# Patient Record
Sex: Female | Born: 1970
Health system: Southern US, Community
[De-identification: ages and names within clinical notes are randomized; demographics above are authoritative.]

## PROBLEM LIST (undated history)

## (undated) DIAGNOSIS — Z87442 Personal history of urinary calculi: Secondary | ICD-10-CM

## (undated) DIAGNOSIS — Z923 Personal history of irradiation: Secondary | ICD-10-CM

## (undated) DIAGNOSIS — C50919 Malignant neoplasm of unspecified site of unspecified female breast: Secondary | ICD-10-CM

## (undated) DIAGNOSIS — R0989 Other specified symptoms and signs involving the circulatory and respiratory systems: Secondary | ICD-10-CM

## (undated) DIAGNOSIS — K219 Gastro-esophageal reflux disease without esophagitis: Secondary | ICD-10-CM

## (undated) DIAGNOSIS — R0683 Snoring: Secondary | ICD-10-CM

## (undated) DIAGNOSIS — Z9221 Personal history of antineoplastic chemotherapy: Secondary | ICD-10-CM

## (undated) DIAGNOSIS — Z973 Presence of spectacles and contact lenses: Secondary | ICD-10-CM

## (undated) HISTORY — PX: WISDOM TOOTH EXTRACTION: SHX21

## (undated) HISTORY — DX: Malignant neoplasm of unspecified site of unspecified female breast: C50.919

## (undated) HISTORY — PX: BREAST BIOPSY: SHX20

## (undated) HISTORY — PX: BREAST LUMPECTOMY: SHX2

---

## 1998-06-20 ENCOUNTER — Other Ambulatory Visit: Admission: RE | Admit: 1998-06-20 | Discharge: 1998-06-20 | Payer: Self-pay | Admitting: Obstetrics and Gynecology

## 1999-03-15 ENCOUNTER — Emergency Department (HOSPITAL_COMMUNITY): Admission: EM | Admit: 1999-03-15 | Discharge: 1999-03-15 | Payer: Self-pay | Admitting: Emergency Medicine

## 2001-09-09 ENCOUNTER — Other Ambulatory Visit: Admission: RE | Admit: 2001-09-09 | Discharge: 2001-09-09 | Payer: Self-pay | Admitting: Obstetrics and Gynecology

## 2003-02-12 ENCOUNTER — Other Ambulatory Visit: Admission: RE | Admit: 2003-02-12 | Discharge: 2003-02-12 | Payer: Self-pay | Admitting: Obstetrics and Gynecology

## 2003-10-24 ENCOUNTER — Other Ambulatory Visit: Admission: RE | Admit: 2003-10-24 | Discharge: 2003-10-24 | Payer: Self-pay | Admitting: Obstetrics and Gynecology

## 2004-02-18 ENCOUNTER — Other Ambulatory Visit: Admission: RE | Admit: 2004-02-18 | Discharge: 2004-02-18 | Payer: Self-pay | Admitting: Obstetrics and Gynecology

## 2013-07-24 ENCOUNTER — Other Ambulatory Visit: Payer: Self-pay | Admitting: Obstetrics and Gynecology

## 2013-07-24 DIAGNOSIS — N644 Mastodynia: Secondary | ICD-10-CM

## 2013-07-24 DIAGNOSIS — N6459 Other signs and symptoms in breast: Secondary | ICD-10-CM

## 2013-07-28 ENCOUNTER — Other Ambulatory Visit: Payer: Self-pay | Admitting: Obstetrics and Gynecology

## 2013-07-28 ENCOUNTER — Ambulatory Visit
Admission: RE | Admit: 2013-07-28 | Discharge: 2013-07-28 | Disposition: A | Discharge status: Home / Self Care | Payer: 59 | Source: Ambulatory Visit | Attending: Obstetrics and Gynecology | Admitting: Obstetrics and Gynecology

## 2013-07-28 DIAGNOSIS — N644 Mastodynia: Secondary | ICD-10-CM

## 2013-07-28 DIAGNOSIS — N6459 Other signs and symptoms in breast: Secondary | ICD-10-CM

## 2013-08-07 ENCOUNTER — Other Ambulatory Visit: Payer: Self-pay | Admitting: Obstetrics and Gynecology

## 2013-08-07 DIAGNOSIS — N6459 Other signs and symptoms in breast: Secondary | ICD-10-CM

## 2013-08-07 DIAGNOSIS — N644 Mastodynia: Secondary | ICD-10-CM

## 2013-08-08 ENCOUNTER — Ambulatory Visit
Admission: RE | Admit: 2013-08-08 | Discharge: 2013-08-08 | Disposition: A | Discharge status: Home / Self Care | Payer: 59 | Source: Ambulatory Visit | Attending: Obstetrics and Gynecology | Admitting: Obstetrics and Gynecology

## 2013-08-08 DIAGNOSIS — N6459 Other signs and symptoms in breast: Secondary | ICD-10-CM

## 2013-08-08 DIAGNOSIS — N644 Mastodynia: Secondary | ICD-10-CM

## 2013-08-09 ENCOUNTER — Other Ambulatory Visit: Payer: Self-pay | Admitting: Obstetrics and Gynecology

## 2013-08-09 DIAGNOSIS — C50911 Malignant neoplasm of unspecified site of right female breast: Secondary | ICD-10-CM

## 2013-08-09 DIAGNOSIS — D0511 Intraductal carcinoma in situ of right breast: Secondary | ICD-10-CM

## 2013-08-11 ENCOUNTER — Telehealth: Payer: Self-pay | Admitting: *Deleted

## 2013-08-11 ENCOUNTER — Other Ambulatory Visit: Payer: Self-pay | Admitting: *Deleted

## 2013-08-11 DIAGNOSIS — Z170 Estrogen receptor positive status [ER+]: Secondary | ICD-10-CM | POA: Insufficient documentation

## 2013-08-11 DIAGNOSIS — C50411 Malignant neoplasm of upper-outer quadrant of right female breast: Secondary | ICD-10-CM | POA: Insufficient documentation

## 2013-08-11 NOTE — Telephone Encounter (Signed)
Confirmed [facility] for 08/16/13 at 0800.  Instructions and contact information given.

## 2013-08-15 ENCOUNTER — Ambulatory Visit
Admission: RE | Admit: 2013-08-15 | Discharge: 2013-08-15 | Disposition: A | Discharge status: Home / Self Care | Payer: 59 | Source: Ambulatory Visit | Attending: Obstetrics and Gynecology | Admitting: Obstetrics and Gynecology

## 2013-08-15 ENCOUNTER — Other Ambulatory Visit: Payer: 59

## 2013-08-15 DIAGNOSIS — D0511 Intraductal carcinoma in situ of right breast: Secondary | ICD-10-CM

## 2013-08-15 DIAGNOSIS — C50911 Malignant neoplasm of unspecified site of right female breast: Secondary | ICD-10-CM

## 2013-08-15 MED ORDER — GADOBENATE DIMEGLUMINE 529 MG/ML IV SOLN
20.0000 mL | Freq: Once | INTRAVENOUS | Status: AC | PRN
  Administered 2013-08-15: 20 mL via INTRAVENOUS

## 2013-08-16 ENCOUNTER — Telehealth: Payer: Self-pay | Admitting: Oncology

## 2013-08-16 ENCOUNTER — Encounter: Payer: Self-pay | Admitting: Oncology

## 2013-08-16 ENCOUNTER — Encounter: Payer: Self-pay | Admitting: *Deleted

## 2013-08-16 ENCOUNTER — Other Ambulatory Visit (HOSPITAL_BASED_OUTPATIENT_CLINIC_OR_DEPARTMENT_OTHER): Payer: 59

## 2013-08-16 ENCOUNTER — Ambulatory Visit
Admission: RE | Admit: 2013-08-16 | Discharge: 2013-08-16 | Disposition: A | Discharge status: Home / Self Care | Payer: 59 | Source: Ambulatory Visit | Attending: Radiation Oncology | Admitting: Radiation Oncology

## 2013-08-16 ENCOUNTER — Ambulatory Visit: Payer: 59

## 2013-08-16 ENCOUNTER — Encounter: Payer: Self-pay | Admitting: Dietician

## 2013-08-16 ENCOUNTER — Ambulatory Visit (HOSPITAL_BASED_OUTPATIENT_CLINIC_OR_DEPARTMENT_OTHER): Payer: 59 | Admitting: Surgery

## 2013-08-16 ENCOUNTER — Encounter (INDEPENDENT_AMBULATORY_CARE_PROVIDER_SITE_OTHER): Payer: Self-pay

## 2013-08-16 ENCOUNTER — Encounter (INDEPENDENT_AMBULATORY_CARE_PROVIDER_SITE_OTHER): Payer: Self-pay | Admitting: Surgery

## 2013-08-16 ENCOUNTER — Ambulatory Visit (HOSPITAL_BASED_OUTPATIENT_CLINIC_OR_DEPARTMENT_OTHER): Payer: 59 | Admitting: Oncology

## 2013-08-16 VITALS — BP 117/67 | HR 81 | Temp 98.2°F | Resp 18 | Ht 65.0 in | Wt 313.1 lb

## 2013-08-16 DIAGNOSIS — C50919 Malignant neoplasm of unspecified site of unspecified female breast: Secondary | ICD-10-CM

## 2013-08-16 DIAGNOSIS — C50411 Malignant neoplasm of upper-outer quadrant of right female breast: Secondary | ICD-10-CM

## 2013-08-16 DIAGNOSIS — Z170 Estrogen receptor positive status [ER+]: Secondary | ICD-10-CM

## 2013-08-16 DIAGNOSIS — C50911 Malignant neoplasm of unspecified site of right female breast: Secondary | ICD-10-CM

## 2013-08-16 DIAGNOSIS — C50419 Malignant neoplasm of upper-outer quadrant of unspecified female breast: Secondary | ICD-10-CM

## 2013-08-16 LAB — COMPREHENSIVE METABOLIC PANEL (CC13)
ALBUMIN: 3.4 g/dL — AB (ref 3.5–5.0)
ALK PHOS: 76 U/L (ref 40–150)
ALT: 20 U/L (ref 0–55)
AST: 20 U/L (ref 5–34)
Anion Gap: 10 mEq/L (ref 3–11)
BUN: 7.7 mg/dL (ref 7.0–26.0)
CALCIUM: 9.1 mg/dL (ref 8.4–10.4)
CHLORIDE: 106 meq/L (ref 98–109)
CO2: 24 mEq/L (ref 22–29)
Creatinine: 0.8 mg/dL (ref 0.6–1.1)
GLUCOSE: 113 mg/dL (ref 70–140)
POTASSIUM: 3.8 meq/L (ref 3.5–5.1)
Sodium: 140 mEq/L (ref 136–145)
Total Bilirubin: 0.24 mg/dL (ref 0.20–1.20)
Total Protein: 7.1 g/dL (ref 6.4–8.3)

## 2013-08-16 LAB — CBC WITH DIFFERENTIAL/PLATELET
BASO%: 0.7 % (ref 0.0–2.0)
BASOS ABS: 0.1 10*3/uL (ref 0.0–0.1)
EOS%: 1 % (ref 0.0–7.0)
Eosinophils Absolute: 0.1 10*3/uL (ref 0.0–0.5)
HEMATOCRIT: 38.2 % (ref 34.8–46.6)
HGB: 12.1 g/dL (ref 11.6–15.9)
LYMPH%: 39.4 % (ref 14.0–49.7)
MCH: 26.6 pg (ref 25.1–34.0)
MCHC: 31.7 g/dL (ref 31.5–36.0)
MCV: 83.8 fL (ref 79.5–101.0)
MONO#: 0.6 10*3/uL (ref 0.1–0.9)
MONO%: 7.1 % (ref 0.0–14.0)
NEUT#: 4.2 10*3/uL (ref 1.5–6.5)
NEUT%: 51.8 % (ref 38.4–76.8)
PLATELETS: 341 10*3/uL (ref 145–400)
RBC: 4.56 10*6/uL (ref 3.70–5.45)
RDW: 16.1 % — AB (ref 11.2–14.5)
WBC: 8.2 10*3/uL (ref 3.9–10.3)
lymph#: 3.2 10*3/uL (ref 0.9–3.3)

## 2013-08-16 NOTE — Patient Instructions (Signed)
Implanted Port Insertion  An implanted port is a central line that has a round shape and is placed under the skin. It is used as a long-term IV access for:    Medicines, such as chemotherapy.    Fluids.    Liquid nutrition, such as total parenteral nutrition (TPN).    Blood samples.   LET YOUR HEALTH CARE PROVIDER KNOW ABOUT:   Allergies to food or medicine.    Medicines taken, including vitamins, herbs, eye drops, creams, and over-the-counter medicines.    Any allergies to heparin.   Use of steroids (by mouth or creams).    Previous problems with anesthetics or numbing medicines.    History of bleeding problems or blood clots.    Previous surgery.    Other health problems, including diabetes and kidney problems.    Possibility of pregnancy, if this applies.  RISKS AND COMPLICATIONS  Generally, this is a safe procedure. However, as with any procedure, problems can occur. Possible problems include:   Damage to the blood vessel, bruising, or bleeding at the puncture site.    Infection.   Blood clot in the vessel that the port is in.   Breakdown of the skin over your port.   Very rarely a person may develop a condition called a pneumothorax, a collection of air in the chest that may cause one of the lungs to collapse. The placement of these catheters with the appropriate imaging guidance significantly decreases the risk of a pneumothorax.   BEFORE THE PROCEDURE    Your health care provider may want you to have blood tests. These tests can help tell how well your kidneys and liver are working. They can also show how well your blood clots.    If you take blood thinners (anticoagulant medicines), ask your health care provider when you should stop taking them.    Make arrangements for someone to drive you home. This is necessary if you have been sedated for your procedure.   PROCEDURE   Port insertion usually takes about 30-45 minutes.    An IV needle will be inserted in your arm.  Medicine for pain and medicine to help relax you (sedative) will flow directly into your body through this needle.    You will lie on an exam table, and you will be connected to monitors to keep track of your heart rate, blood pressure, and breathing throughout the procedure.   An oxygen monitoring device may be attached to your finger. Oxygen will be given.    Everything will be kept as germ free (sterile) as possible during the procedure. The skin near the point of the incision will be cleansed with antiseptic, and the area will be draped with sterile towels. The skin and deeper tissues over the port area will be made numb with a local anesthetic.   Two small cuts (incisions) will be made in the skin to insert the port. One will be made in the neck to obtain access to the vein where the catheter will lie.    Because the port reservoir will be placed under the skin, a small skin incision will be made in the upper chest, and a small pocket for the port will be made under the skin. The catheter that will be connected to the port tunnels to a large central vein in the chest. A small, raised area will remain on your body at the site of the reservoir when the procedure is complete.   The port placement   will be done under imaging guidance to ensure the proper placement.   The reservoir has a silicone covering that can be punctured with a special needle.    The port will be flushed with normal saline, and blood will be drawn to make sure it is working properly.   There will be nothing remaining outside the skin when the procedure is finished.    Incisions will be held together by stitches, surgical glue, or a special tape.  AFTER THE PROCEDURE   You will stay in a recovery area until the anesthesia has worn off. Your blood pressure and pulse will be checked.   A final chest X-ray will be taken to check the placement of the port and to ensure that there is no injury to your lung.  Document Released:  10/19/2012 Document Revised: 05/15/2013 Document Reviewed: 10/19/2012  [employer] Patient Information 2015 [employer]. This information is not intended to replace advice given to you by your health care provider. Make sure you discuss any questions you have with your health care provider.

## 2013-08-16 NOTE — Progress Notes (Signed)
I did advise her of the [place]--

## 2013-08-16 NOTE — Progress Notes (Signed)
[facility] NEW PATIENT EVALUATION  Name: [name] MRN: [id number]  Date:   08/16/2013           DOB: 1970-05-08  Status: outpatient   CC: Linda Pel, MD  [name], Linda Faster., MD    REFERRING PHYSICIAN: Cornett, Linda Faster., MD   DIAGNOSIS:  [name] I (T1 N0 M0) invasive ductal/DCIS of the right breast  HISTORY OF PRESENT ILLNESS:  [name] is a 43 y.o. female who is seen today at the [facility] for evaluation of her T1 N0 invasive ductal/DCIS of the right breast. At the time of screening mammography on July 17 she was noted to have a right breast mass with calcifications at 10:30. Ultrasound showed a 1.1 x 0.9 cm mass along with 2-3 lymph nodes were thickened cortices. On July 28 she underwent biopsy of the right breast mass and an axillary lymph node. The lymph node was benign, and the right breast mass was diagnostic for invasive ductal/DCIS. The tumor was ER positive at 49%, PR +47% with an elevated Ki-67 of 52%. HER-2/neu is amplified. Breast MR on August 4 should a 2.0 cm mass within the upper-outer quadrant of the right breast with no other suspicious lesions. She seen today with Dr. Brantley [name] and Dr. Jana [name].  PREVIOUS RADIATION THERAPY: No   PAST MEDICAL HISTORY:  has a past medical history of Anxiety.     PAST SURGICAL HISTORY: No past surgical history on file.   FAMILY HISTORY: family history includes Colon cancer in her father; Leukemia in her maternal grandmother; Melanoma in her paternal grandmother; Prostate cancer in her cousin and other; Uterine cancer in her mother. No family history of breast cancer.   SOCIAL HISTORY:  reports that she quit smoking about 5 months ago. She does not have any smokeless tobacco history on file. She reports that she drinks alcohol. She reports that she does not use illicit drugs. Single, no children. 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   ALLERGIES: Amoxicillin   MEDICATIONS:  Current  Outpatient Prescriptions  Medication Sig Dispense Refill  . Calcium-Vitamin D-Vitamin K (CALCIUM SOFT CHEWS PO) Take by mouth daily.      . Multiple Vitamins-Minerals (MULTI ADULT GUMMIES PO) Take 2 each by mouth daily.       No current facility-administered medications for this encounter.     REVIEW OF SYSTEMS:  Pertinent items are noted in HPI.    PHYSICAL EXAM: Alert and oriented 43 year old African American female appearing her stated age. Head and neck examination: Grossly unremarkable. Nodes: Without palpable cervical, supraclavicular, or axillary lymphadenopathy. Chest: Lungs clear. Breasts: She is large breasted. There is a biopsy wound at approximately 12:00 along with a small area of ecchymosis. There is a  palpable superficial 2-3 cm mass along the upper-outer quadrant at approximately 10. Left breast without masses or lesions. Extremities: Without edema.    LABORATORY DATA:  Lab Results  Component Value Date   WBC 8.2 08/16/2013   HGB 12.1 08/16/2013   HCT 38.2 08/16/2013   MCV 83.8 08/16/2013   PLT 341 08/16/2013   Lab Results  Component Value Date   NA 140 08/16/2013   K 3.8 08/16/2013   CO2 24 08/16/2013   Lab Results  Component Value Date   ALT 20 08/16/2013   AST 20 08/16/2013   ALKPHOS 76 08/16/2013   BILITOT 0.24 08/16/2013      IMPRESSION: Clinical [name] I (T1, N0, M0) invasive ductal/DCIS of the  right breast. We discussed local treatment options which include mastectomy versus partial mastectomy followed by radiation therapy. Both options would include a sentinel lymph node biopsy. We discussed the value of genetic testing based on her age and strong family history of cancer. She'll benefit from neoadjuvant chemotherapy awaiting for results of genetic testing. If her genetic testing is negative then she is certainly candidate for breast preservation. We discussed the potential acute and late toxicities of radiation therapy. At this point in time she has decided on neoadjuvant  chemotherapy followed by breast conservative surgery and then radiation therapy. I would repeat her right breast mammogram prior to initiation of radiation therapy to confirm removal of all suspicious microcalcifications which probably represent DCIS.  PLAN: As discussed above.   I spent 30 minutes minutes face to face with the patient and more than 50% of that time was spent in counseling and/or coordination of care.

## 2013-08-16 NOTE — Progress Notes (Signed)
[place]  [phone] [phone]     ID: [name] DOB: [birth year]  MR#: [id number]  RKY#:[id number]  Patient Care Team: Horatio Pel, MD as PCP - General (Internal Medicine) Joyice Faster. Cornett, MD as Consulting Physician (General Surgery) Chauncey Cruel, MD as Consulting Physician (Oncology) Rexene Edison, MD as Consulting Physician (Radiation Oncology) Allena Katz, MD as Consulting Physician (Obstetrics and Gynecology)  CHIEF COMPLAINT: Newly diagnosed triple positive breast cancer  CURRENT TREATMENT: To start neoadjuvant chemotherapy   BREAST CANCER HISTORY: The patient herself palpated a mass in her right breast mid July. She brought it to Dr. Sherlynn Stalls attention and he set the patient up for bilateral diagnostic mammography and right breast ultrasonography of the breast Center 07/28/2013. Right mammogram showed an irregular mass in the upper outer quadrant measuring 1.4 cm. There was some associated pleomorphic microcalcifications. This was palpable, at the 10:30 position 11 cm from the nipple. By palpation it measured approximately 1.5 cm. Ultrasound confirmed an hypoechoic mass with irregular borders measuring 1.1 cm. The right axilla showed a few adjacent deep lymph nodes with mild cortical thickening.  On 08/08/2013 the patient underwent biopsy of the breast mass in question. The pathology ([id number]) showed an invasive ductal carcinoma, grade 2, estrogen receptor 49% positive, progesterone receptor 47% positive, both with moderate staining intensity, with an MIB-1 of 52%, and HER-2 amplification with a signals ratio of 8.1. The right axillary lymph node biopsied at the same time was benign this was felt to be possibly concordant.  And 08/15/2013 the patient underwent bilateral breast MRI showing a 2 cm irregular enhancing mass in the upper outer quadrant of the right breast. There were no additional changes no abnormal  appearing lymph nodes and no masses or abnormalities in the left breast.  The patient's subsequent history is as detailed below  INTERVAL HISTORY: [name] was evaluated in the multidisciplinary breast cancer clinic 08/16/2013 accompanied by her mother [name]. The patient's case was also presented at the multidisciplinary breast cancer conference that same morning  REVIEW OF SYSTEMS: Aside from the palpable mass itself, there were no specific symptoms leading to the definitive mammogram. The patient denies unusual headaches, visual changes, nausea, vomiting, stiff neck, dizziness, or gait imbalance. There has been no cough, phlegm production, or pleurisy, no chest pain or pressure, and no change in bowel or bladder habits. The patient denies fever, rash, bleeding, unexplained fatigue or unexplained weight loss. The patient does complain of insomnia, ankle swelling, some back and joint pain "here and there" not more intense or persistent than before. She feels forgetful and anxious. A detailed review of systems was otherwise entirely negative.  PAST MEDICAL HISTORY: Past Medical History  Diagnosis Date  . Anxiety     PAST SURGICAL HISTORY: History reviewed. No pertinent past surgical history.  FAMILY HISTORY Family History  Problem Relation Age of Onset  . Uterine cancer Mother   . Colon cancer Father   . Leukemia Maternal Grandmother   . Melanoma Paternal Grandmother   . Prostate cancer Cousin   . Prostate cancer Other    the patient's father died from colon cancer the age of 54. It had been diagnosed 3 years prior. The patient's mother was diagnosed with cancer of the uterus at age 28. She is 44 years old as of August 2015. The patient had no brothers or sisters. There is a history of prostate cancer leukemia at in the family as well as melanoma, but  there is no history of breast or ovarian cancer in the family.  GYNECOLOGIC HISTORY:  No LMP recorded. Menarche age 72. The patient is GX P0.  She is still having regular periods. 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 She lives alone, with no pets.    ADVANCED DIRECTIVES: Not in place. At her 08/16/2013 visit the patient was given the upper. Documents 2 complete and notarize so she may name her mother, [name] focal as her healthcare power of attorney, which is the patient's intention. Ms. focal can be reached at [phone]. She currently lives in [place] but is planning to move to [place]: History  Substance Use Topics  . Smoking status: Former Smoker    Quit date: 02/20/2013  . Smokeless tobacco: Not on file  . Alcohol Use: Yes     Comment: 1/week     Colonoscopy: Never  PAP: July 2015 Bone density: Never  Lipid panel:  Allergies  Allergen Reactions  . Amoxicillin Rash    Current Outpatient Prescriptions  Medication Sig Dispense Refill  . Calcium-Vitamin D-Vitamin K (CALCIUM SOFT CHEWS PO) Take by mouth daily.      . Multiple Vitamins-Minerals (MULTI ADULT GUMMIES PO) Take 2 each by mouth daily.       No current facility-administered medications for this visit.    OBJECTIVE: Middle-aged [place] American woman in no acute distress Filed Vitals:   08/16/13 0807  BP: 117/67  Pulse: 81  Temp: 98.2 F (36.8 C)  Resp: 18     Body mass index is 52.1 kg/(m^2).    ECOG FS:0 - Asymptomatic  Ocular: Sclerae unicteric, pupils round and equal Ear-nose-throat: Oropharynx clear and moist Lymphatic: No cervical or supraclavicular adenopathy Lungs no rales or rhonchi, good excursion bilaterally Heart regular rate and rhythm, no murmur appreciated Abd soft, obese, nontender, positive bowel sounds MSK no focal spinal tenderness, no joint edema Neuro: non-focal, well-oriented, appropriate affect Breasts: The right breast is status post recent biopsy. There is a small  ecchymosis at the site. I do not palpate a well-defined mass. There are no skin or nipple changes of concern. The right axilla is benign. The left breast is unremarkable.   LAB RESULTS:  CMP     Component Value Date/Time   NA 140 08/16/2013 0759   K 3.8 08/16/2013 0759   CO2 24 08/16/2013 0759   GLUCOSE 113 08/16/2013 0759   BUN 7.7 08/16/2013 0759   CREATININE 0.8 08/16/2013 0759   CALCIUM 9.1 08/16/2013 0759   PROT 7.1 08/16/2013 0759   ALBUMIN 3.4* 08/16/2013 0759   AST 20 08/16/2013 0759   ALT 20 08/16/2013 0759   ALKPHOS 76 08/16/2013 0759   BILITOT 0.24 08/16/2013 0759    I No results found for this basename: SPEP,  UPEP,   kappa and lambda light chains    Lab Results  Component Value Date   WBC 8.2 08/16/2013   NEUTROABS 4.2 08/16/2013   HGB 12.1 08/16/2013   HCT 38.2 08/16/2013   MCV 83.8 08/16/2013   PLT 341 08/16/2013      Chemistry      Component Value Date/Time   NA 140 08/16/2013 0759   K 3.8 08/16/2013 0759   CO2 24 08/16/2013 0759   BUN 7.7 08/16/2013 0759   CREATININE 0.8 08/16/2013 0759      Component Value  Date/Time   CALCIUM 9.1 08/16/2013 0759   ALKPHOS 76 08/16/2013 0759   AST 20 08/16/2013 0759   ALT 20 08/16/2013 0759   BILITOT 0.24 08/16/2013 0759       No results found for this basename: LABCA2    No components found with this basename: LABCA125    No results found for this basename: INR,  in the last 168 hours  Urinalysis No results found for this basename: colorurine,  appearanceur,  labspec,  phurine,  glucoseu,  hgbur,  bilirubinur,  ketonesur,  proteinur,  urobilinogen,  nitrite,  leukocytesur    STUDIES: Mr Breast Bilateral W Wo Contrast  08/15/2013   CLINICAL DATA:  Recently diagnosed right breast invasive mammary carcinoma on ultrasound-guided core biopsy. Preoperative evaluation.  EXAM: BILATERAL BREAST MRI WITH AND WITHOUT CONTRAST  TECHNIQUE: Multiplanar, multisequence MR images of both breasts were obtained prior to and following the intravenous administration of 37m  of MultiHance  THREE-DIMENSIONAL MR IMAGE RENDERING ON INDEPENDENT WORKSTATION:  Three-dimensional MR images were rendered by post-processing of the original MR data on an independent workstation. The three-dimensional MR images were interpreted, and findings are reported in the following complete MRI report for this study. Three dimensional images were evaluated at the independent DynaCad workstation  COMPARISON:  08/08/2013 and 07/28/2013 mammograms as well as 07/28/2013 right breast ultrasound.  FINDINGS: Breast composition: b.  Scattered fibroglandular tissue.  Background parenchymal enhancement: Mild  Right breast: There is a 2.0 x 1.4 x 1.4 cm irregular enhancing mass with central clip artifact located within the upper outer quadrant of the right breast (middle 1/3). This is associated with a mixture of wash in / washout and plateau enhancement kinetics. This corresponds to the known invasive mammary carcinoma. There is mild adjacent post biopsy change. There are no additional and worrisome enhancing foci within the right breast.  Left breast: No mass or abnormal enhancement  Lymph nodes: No abnormal appearing lymph nodes.  Ancillary findings:  None.  IMPRESSION: 2.0 cm irregular enhancing mass with central clip artifact located within the upper outer quadrant of the right breast corresponding to the recently diagnosed invasive mammary carcinoma. No additional findings.  RECOMMENDATION: Treatment plan.  BI-RADS CATEGORY  6: Known biopsy-proven malignancy.   Electronically Signed   By: RLuberta RobertsonM.D.   On: 08/15/2013 10:09   Mm Digital Diagnostic Bilat  07/28/2013   CLINICAL DATA:  Patient presents for a bilateral diagnostic evaluation due to a palpable abnormality over the upper outer right breast for 2 weeks. History ovarian cancer in her mother and colon cancer in her father.  EXAM: DIGITAL DIAGNOSTIC  bilateral MAMMOGRAM WITH CAD  ULTRASOUND right BREAST  COMPARISON:  None.  ACR Breast Density  Category b: There are scattered areas of fibroglandular density.  FINDINGS: Exam demonstrates an irregular shaped mass over the upper outer quadrant of the right breast measuring 1 x 1.2 x 1.4 cm corresponding to patient's palpable abnormality. There are a few associated pleomorphic microcalcifications with this mass. Remainder of the exam is unremarkable.  Mammographic images were processed with CAD.  On physical exam, I palpate a 1.5 cm fixed mass over the upper outer quadrant of the right breast at approximately the 10:30 position 11 cm from the nipple.  Ultrasound is performed, showing an oval hypoechoic mass with irregular and angulated borders at the 10:30 position of the right breast 11 cm from the nipple measuring 0.8 x 0.9 x 1.1 cm. There are a few associated echogenic foci likely  representing the microcalcifications seen mammographically.  Ultrasound of the right axilla demonstrates a few adjacent deep lymph nodes over the lower axillary region with mild cortical thickening.  IMPRESSION: Oval hypoechoic mass with irregular and angulated borders and associated pleomorphic microcalcifications at the 10:30 position of the right breast 11 cm from the nipple measuring 0.8 x 0.9 x 1.1 cm corresponding to patient's palpable abnormality. 2-3 adjacent lymph nodes over the deep lower right axillary region with slightly thickened cortex.  RECOMMENDATION: Recommend ultrasound-guided core needle biopsy of this suspicious right breast mass and sampling of 1 of the slightly suspicious lower right axillary lymph nodes.  I have discussed the findings and recommendations with the patient. Results were also provided in writing at the conclusion of the visit. If applicable, a reminder letter will be sent to the patient regarding the next appointment.  BI-RADS CATEGORY  5: Highly suggestive of malignancy.  Biopsy scheduled for 08/08/2013 at 8 a.m.   Electronically Signed   By: Marin Olp M.D.   On: 07/28/2013 16:42   Mm  Digital Diagnostic Unilat R  08/08/2013   CLINICAL DATA:  Status post ultrasound-guided right breast mass 10 o'clock location biopsy with clip placement  EXAM: DIAGNOSTIC right MAMMOGRAM POST ULTRASOUND BIOPSY  COMPARISON:  Previous exams  FINDINGS: Mammographic images were obtained following ultrasound guided biopsy of right breast mass 10 o'clock location with wing shaped clip placement. The wing shaped clip is appropriately located. No clip was placed at the site of right axillary biopsy also performed today.  IMPRESSION: Appropriate wing shaped clip location, right breast mass 10 o'clock location.  Final Assessment: Post Procedure Mammograms for Marker Placement   Electronically Signed   By: Conchita Paris M.D.   On: 08/08/2013 09:28   US Breast Ltd Uni Right Inc Axilla  07/28/2013   CLINICAL DATA:  Patient presents for a bilateral diagnostic evaluation due to a palpable abnormality over the upper outer right breast for 2 weeks. History ovarian cancer in her mother and colon cancer in her father.  EXAM: DIGITAL DIAGNOSTIC  bilateral MAMMOGRAM WITH CAD  ULTRASOUND right BREAST  COMPARISON:  None.  ACR Breast Density Category b: There are scattered areas of fibroglandular density.  FINDINGS: Exam demonstrates an irregular shaped mass over the upper outer quadrant of the right breast measuring 1 x 1.2 x 1.4 cm corresponding to patient's palpable abnormality. There are a few associated pleomorphic microcalcifications with this mass. Remainder of the exam is unremarkable.  Mammographic images were processed with CAD.  On physical exam, I palpate a 1.5 cm fixed mass over the upper outer quadrant of the right breast at approximately the 10:30 position 11 cm from the nipple.  Ultrasound is performed, showing an oval hypoechoic mass with irregular and angulated borders at the 10:30 position of the right breast 11 cm from the nipple measuring 0.8 x 0.9 x 1.1 cm. There are a few associated echogenic foci likely  representing the microcalcifications seen mammographically.  Ultrasound of the right axilla demonstrates a few adjacent deep lymph nodes over the lower axillary region with mild cortical thickening.  IMPRESSION: Oval hypoechoic mass with irregular and angulated borders and associated pleomorphic microcalcifications at the 10:30 position of the right breast 11 cm from the nipple measuring 0.8 x 0.9 x 1.1 cm corresponding to patient's palpable abnormality. 2-3 adjacent lymph nodes over the deep lower right axillary region with slightly thickened cortex.  RECOMMENDATION: Recommend ultrasound-guided core needle biopsy of this suspicious right breast mass and sampling  of 1 of the slightly suspicious lower right axillary lymph nodes.  I have discussed the findings and recommendations with the patient. Results were also provided in writing at the conclusion of the visit. If applicable, a reminder letter will be sent to the patient regarding the next appointment.  BI-RADS CATEGORY  5: Highly suggestive of malignancy.  Biopsy scheduled for 08/08/2013 at 8 a.m.   Electronically Signed   By: Marin Olp M.D.   On: 07/28/2013 16:42   [place] Rt Breast Bx W Loc Dev 1st Lesion Img Bx Spec US Guide  08/09/2013   ADDENDUM REPORT: 08/09/2013 13:07  ADDENDUM: Addendum by Dr. Conchita Paris on 08/09/2013 at 1:05 p.m. I spoke with the patient by telephone to discuss her pathology results. Pathology at the breast biopsy site demonstrates invasive mammary carcinoma, which is concordant. The right axillary lymph node demonstrated benign lymphoid tissue but no evidence for malignancy. This could be concordant however there is a risk of under sampling especially in the case of lymph nodes, and correlation with presumed sentinel node biopsy is ultimately recommended. Breast MRI is scheduled 08/15/2013 at 8 a.m. Multi disciplinary clinic is scheduled 08/16/2013. The patient reports minimal soreness at the biopsy site but no other problems.  All questions were answered.   Electronically Signed   By: Conchita Paris M.D.   On: 08/09/2013 13:07   08/09/2013   CLINICAL DATA:  SUSPICIOUS RIGHT BREAST MASS 10 o'clock LOCATION AND RIGHT AXILLARY ABNORMAL LYMPH NODE  EXAM: ULTRASOUND GUIDED right BREAST CORE NEEDLE BIOPSY AND CORE RIGHT AXILLA BIOPSY  COMPARISON:  Previous exams.  PROCEDURE: I met with the patient and we discussed the procedure of ultrasound-guided biopsy, including benefits and alternatives. We discussed the high likelihood of a successful procedure. We discussed the risks of the procedure including infection, bleeding, tissue injury, clip migration, and inadequate sampling. Informed written consent was given. The usual time-out protocol was performed immediately prior to the procedure.  Using sterile technique and 2% Lidocaine as local anesthetic, under direct ultrasound visualization, a 12 gauge vacuum-assisteddevice was used to perform biopsy of the right breast mass 10 o'clock location using a lateral to medial approach. At the conclusion of the procedure, a wing shaped tissue marker clip was deployed into the biopsy cavity.  Using similar technique and a 12 gauge vacuum assisted core needle device, an abnormal appearing right axillary lymph node was then biopsied. A clip was not placed at the site of the right axilla biopsy.  Follow-up 2-view mammogram was performed and dictated separately.  IMPRESSION: Ultrasound-guided biopsy of right breast mass 10 o'clock location and right axillary lymph node, both with core vacuum needle biopsy technique. A wing shaped clip was placed at the site of the breast biopsy but not the axilla. Pathology is pending. No apparent complications.  Electronically Signed: By: Conchita Paris M.D. On: 08/08/2013 09:27   [place] Rt Breast Bx W Loc Dev Ea Add Lesion Img Bx Spec US Guide  08/09/2013   ADDENDUM REPORT: 08/09/2013 13:07  ADDENDUM: Addendum by Dr. Conchita Paris on 08/09/2013 at 1:05 p.m. I spoke with  the patient by telephone to discuss her pathology results. Pathology at the breast biopsy site demonstrates invasive mammary carcinoma, which is concordant. The right axillary lymph node demonstrated benign lymphoid tissue but no evidence for malignancy. This could be concordant however there is a risk of under sampling especially in the case of lymph nodes, and correlation with presumed sentinel node biopsy is ultimately recommended. Breast MRI  is scheduled 08/15/2013 at 8 a.m. Multi disciplinary clinic is scheduled 08/16/2013. The patient reports minimal soreness at the biopsy site but no other problems. All questions were answered.   Electronically Signed   By: Conchita Paris M.D.   On: 08/09/2013 13:07   08/09/2013   CLINICAL DATA:  SUSPICIOUS RIGHT BREAST MASS 10 o'clock LOCATION AND RIGHT AXILLARY ABNORMAL LYMPH NODE  EXAM: ULTRASOUND GUIDED right BREAST CORE NEEDLE BIOPSY AND CORE RIGHT AXILLA BIOPSY  COMPARISON:  Previous exams.  PROCEDURE: I met with the patient and we discussed the procedure of ultrasound-guided biopsy, including benefits and alternatives. We discussed the high likelihood of a successful procedure. We discussed the risks of the procedure including infection, bleeding, tissue injury, clip migration, and inadequate sampling. Informed written consent was given. The usual time-out protocol was performed immediately prior to the procedure.  Using sterile technique and 2% Lidocaine as local anesthetic, under direct ultrasound visualization, a 12 gauge vacuum-assisteddevice was used to perform biopsy of the right breast mass 10 o'clock location using a lateral to medial approach. At the conclusion of the procedure, a wing shaped tissue marker clip was deployed into the biopsy cavity.  Using similar technique and a 12 gauge vacuum assisted core needle device, an abnormal appearing right axillary lymph node was then biopsied. A clip was not placed at the site of the right axilla biopsy.   Follow-up 2-view mammogram was performed and dictated separately.  IMPRESSION: Ultrasound-guided biopsy of right breast mass 10 o'clock location and right axillary lymph node, both with core vacuum needle biopsy technique. A wing shaped clip was placed at the site of the breast biopsy but not the axilla. Pathology is pending. No apparent complications.  Electronically Signed: By: Conchita Paris M.D. On: 08/08/2013 09:27    ASSESSMENT: 43 y.o. [place] woman status post right breast upper outer quadrant biopsy 08/08/2013 for a triple positive invasive ductal carcinoma, grade 2, with an MIB-1 of 52%, and a suspicious lymph node biopsied at the same time pathologically benign.  PLAN: We spent the better part of today's hour-long appointment discussing the biology of breast cancer in general, and the specifics of the patient's tumor in particular. The patient understands that quite aside from local treatment with surgery and radiation, patients with breast cancer require systemic therapy, in order to eradicate any occult microscopic deposits of breast cancer that may already be present at the time of initial biopsy. Because she is triple positive, Marlet will benefit from all 3 types of systemic therapy, namely chemotherapy, antiestrogen pills, and anti-HER-2 immunotherapy.  She understands there is no difference in survival whether chemotherapy is given before or after surgery. In her case we think starting with chemotherapy will allow time for genetic testing in case that affects her decision regarding what kind of surgery to undergo.  Her chemotherapy will consist of carboplatin, docetaxel, trastuzumab, and pertuzumab given by port every 3 weeks x6, with Neulasta support on day 2.  She will have a port placed within the next week or two. She will also come to "chemotherapy school", and we'll have an echocardiogram before her return visit with me. At that visit we will go over the possible side effects,  toxicities and complications of her chemotherapy in more detail and we will choose the initial chemotherapy dates.  The patient has a good understanding of the overall plan. She agrees with it. She knows the goal of treatment in her case is cure. She will call with any problems that  may develop before her next visit here.  Chauncey Cruel, MD   08/16/2013 7:35 PM

## 2013-08-16 NOTE — Progress Notes (Signed)
Patient ID: [name], female   DOB: [birth year], 43 y.o.   MRN: [id number]  No chief complaint on file.   HPI [name] is a 43 y.o. female.  Pt sent at the request of Dr Gaetano Net for right breast cancer 2 cm lateral upper quadrant triple positive.  No symptoms and detected by mammogram.  Strong family history of ovarian and breast cancer. HPI  Past Medical History  Diagnosis Date  . Anxiety     History reviewed. No pertinent past surgical history.  Family History  Problem Relation Age of Onset  . Uterine cancer Mother   . Colon cancer Father   . Leukemia Maternal Grandmother   . Melanoma Paternal Grandmother   . Prostate cancer Cousin   . Prostate cancer Other     Social History History  Substance Use Topics  . Smoking status: Former Smoker    Quit date: 02/20/2013  . Smokeless tobacco: Not on file  . Alcohol Use: Yes     Comment: 1/week    Allergies  Allergen Reactions  . Amoxicillin Rash    Current Outpatient Prescriptions  Medication Sig Dispense Refill  . Calcium-Vitamin D-Vitamin K (CALCIUM SOFT CHEWS PO) Take by mouth daily.      . Multiple Vitamins-Minerals (MULTI ADULT GUMMIES PO) Take 2 each by mouth daily.       No current facility-administered medications for this visit.    Review of Systems Review of Systems  Constitutional: Negative for fever, chills and unexpected weight change.  HENT: Negative for congestion, hearing loss, sore throat, trouble swallowing and voice change.   Eyes: Negative for visual disturbance.  Respiratory: Negative for cough and wheezing.   Cardiovascular: Negative for chest pain, palpitations and leg swelling.  Gastrointestinal: Negative for nausea, vomiting, abdominal pain, diarrhea, constipation, blood in stool, abdominal distention and anal bleeding.  Genitourinary: Negative for hematuria, vaginal bleeding and difficulty urinating.  Musculoskeletal: Negative for arthralgias.  Skin: Negative for rash and wound.   Neurological: Negative for seizures, syncope and headaches.  Hematological: Negative for adenopathy. Does not bruise/bleed easily.  Psychiatric/Behavioral: Negative for confusion.    There were no vitals taken for this visit.  Physical Exam Physical Exam  Constitutional: She is oriented to person, place, and time. She appears well-developed and well-nourished.  HENT:  Head: Normocephalic and atraumatic.  Eyes: Conjunctivae are normal. Pupils are equal, round, and reactive to light.  Neck: Normal range of motion.  Cardiovascular: Normal rate and regular rhythm.   Pulmonary/Chest: Effort normal. Right breast exhibits no inverted nipple, no mass, no nipple discharge, no skin change and no tenderness. Left breast exhibits no inverted nipple, no mass, no nipple discharge, no skin change and no tenderness. Breasts are symmetrical.  Musculoskeletal: Normal range of motion.  Lymphadenopathy:    She has no cervical adenopathy.  Neurological: She is alert and oriented to person, place, and time.  Skin: Skin is warm and dry.  Psychiatric: She has a normal mood and affect. Her behavior is normal. Judgment and thought content normal.    Data Reviewed CLINICAL DATA: Recently diagnosed right breast invasive mammary  carcinoma on ultrasound-guided core biopsy. Preoperative evaluation.  EXAM:  BILATERAL BREAST MRI WITH AND WITHOUT CONTRAST  TECHNIQUE:  Multiplanar, multisequence MR images of both breasts were obtained  prior to and following the intravenous administration of 93m of  MultiHance  THREE-DIMENSIONAL MR IMAGE RENDERING ON INDEPENDENT WORKSTATION:  Three-dimensional MR images were rendered by post-processing of the  original MR data on an  independent workstation. The  three-dimensional MR images were interpreted, and findings are  reported in the following complete MRI report for this study. Three  dimensional images were evaluated at the independent DynaCad  workstation   COMPARISON: 08/08/2013 and 07/28/2013 mammograms as well as  07/28/2013 right breast ultrasound.  FINDINGS:  Breast composition: b. Scattered fibroglandular tissue.  Background parenchymal enhancement: Mild  Right breast: There is a 2.0 x 1.4 x 1.4 cm irregular enhancing mass  with central clip artifact located within the upper outer quadrant  of the right breast (middle 1/3). This is associated with a mixture  of wash in / washout and plateau enhancement kinetics. This  corresponds to the known invasive mammary carcinoma. There is mild  adjacent post biopsy change. There are no additional and worrisome  enhancing foci within the right breast.  Left breast: No mass or abnormal enhancement  Lymph nodes: No abnormal appearing lymph nodes.  Ancillary findings: None.  IMPRESSION:  2.0 cm irregular enhancing mass with central clip artifact located  within the upper outer quadrant of the right breast corresponding to  the recently diagnosed invasive mammary carcinoma. No additional  findings.  RECOMMENDATION:  Treatment plan.  BI-RADS CATEGORY 6: Known biopsy-proven malignancy.  Electronically Signed  By: Luberta Robertson M.D.  On: 08/15/2013 10:09  Triple positive  Assessment    Stage 2 right breast cancer stage 2 Strong family history    Plan    Neoadjuvant chemotherapy and port placement Genetics Breast conservation down the road if BRCA negative Port risk and benefits discussed.  Bleeding ,  Infection,   Pneumthorax,  Hemothorax catheter migration,  catheter replacement and death.  Alternatives to port discussed.  She agrees to proceed.        [name] 08/16/2013, 10:18 AM

## 2013-08-16 NOTE — Telephone Encounter (Signed)
, °

## 2013-08-16 NOTE — Progress Notes (Signed)
Checked in new patient with no financial issues prior to seeing the dr. She has her appt card and has not been out of the country. I gave her [facility] packet.

## 2013-08-16 NOTE — Progress Notes (Unsigned)
Pt seen by RD during [facility] on 08/16/2013  Provided pt with folder of educational materials regarding general nutrition recommendations for breast cancer patients, plant-based diets, antioxidants, cancer myths vs truths and organic foods  Diet recall indicates pt with varying PO intake, tends to consume same food several times throughout the day. Encouraged pt to increase fruits and vegetable intake, and to modify starches to include whole grains and higher fiber foods. Reviewed examples of possible snacks. Promoted intake of plant based proteins.   Pt with questions regarding the nutrition-related side effects of chemotherapy. Encouraged intake of bland, dry, starchy foods with nausea. Noted pt also may tolerate cold vs hot foods as they tend to have less nausea inducing odors. Recommended use of nutrition supplements as warranted. Pt with slight lactose intolerance, encouraged use of lactaid or almond milk as tolerated.  Pt also expressed concern with weight as PCP encouraging pt to lose weight by decreasing caloric intake. Recommend pt to maintain weight during treatments, follow healthy balanced diet and to work towards weight loss post treatments.  Provided pt with outpatient oncology RD contact information. Encouraged pt to contact with additional nutrition-related questions or concerns  Atlee Abide MS RD LDN Clinical Dietitian [phone]

## 2013-08-16 NOTE — Telephone Encounter (Signed)
cld & spoke to pt and adv of appt for 8/21-adv pt I would mail out copy of the sch-pt understood

## 2013-08-16 NOTE — Progress Notes (Signed)
[place] Psychosocial Distress Screening  Clinical Social Work   Patient complete distress screening protocol, and scored a 4 on the Psychosocial Distress Thermometer which indicates mild distress. Clinical Social Worker met with patient and patient's mother in [facility] to assess for distress and other psychosocial needs. Patient stated she was doing well and felt comfortable with her treatment plan. CSW and patient discussed the importance of emotional support, and CSW informed patient of the support team and support programs at [facility]. Pt expressed interest in support group and was agreeable to an [employer] referral. CSW encouraged pt to call with any questions or concerns.    Clinical Social Worker follow up needed: Yes.  If yes, follow up plan:  [facility] Referral   [facility] DISTRESS SCREENING 08/16/2013  Screening Type Initial Screening  Mark the number that describes how much distress you have been experiencing in the past week 4  Family Problem type Other (comment)  Emotional problem type Nervousness/Anxiety  Spiritual/Religous concerns type Facing my mortality  Information Concerns Type Lack of info about diagnosis;Lack of info about treatment  Physical Problem type Swollen arms/legs  Physician notified of physical symptoms Yes  Referral to clinical psychology No  Referral to clinical social work No  Referral to dietition No  Referral to financial advocate No  Referral to support programs Yes  Referral to palliative care No  Other mother    Johnnye Lana, MSW, [place] [phone]

## 2013-08-21 ENCOUNTER — Encounter (HOSPITAL_BASED_OUTPATIENT_CLINIC_OR_DEPARTMENT_OTHER): Payer: Self-pay | Admitting: *Deleted

## 2013-08-21 ENCOUNTER — Ambulatory Visit
Admission: RE | Admit: 2013-08-21 | Discharge: 2013-08-21 | Disposition: A | Discharge status: Home / Self Care | Payer: 59 | Source: Ambulatory Visit | Attending: Surgery | Admitting: Surgery

## 2013-08-21 NOTE — Progress Notes (Signed)
Had cbc cmet done cc-08/16/13-will go for cxr-hx fx rt clavical

## 2013-08-22 ENCOUNTER — Telehealth: Payer: Self-pay | Admitting: Oncology

## 2013-08-22 ENCOUNTER — Telehealth: Payer: Self-pay | Admitting: *Deleted

## 2013-08-22 ENCOUNTER — Encounter (INDEPENDENT_AMBULATORY_CARE_PROVIDER_SITE_OTHER): Payer: Self-pay | Admitting: Surgery

## 2013-08-22 NOTE — Telephone Encounter (Signed)
Left message for a return phone call from [facility] 08/16/13.  Awaiting patient response.

## 2013-08-22 NOTE — Telephone Encounter (Signed)
, °

## 2013-08-22 NOTE — Progress Notes (Signed)
Patient scheduled for [facility] Placement on 08/23/13 and wanted to make you aware she has a Gag Reflex.  Patient concerned about possibility of being intubated during surgery on 08/23/13.

## 2013-08-22 NOTE — Telephone Encounter (Signed)
Received call back from patient.She is doing well. She wanted to know when her chemo would start.  I informed her tentatively sometime the week of 09/04/13.  Encouraged her to call with any needs or concerns.

## 2013-08-23 ENCOUNTER — Ambulatory Visit (HOSPITAL_COMMUNITY): Payer: 59

## 2013-08-23 ENCOUNTER — Encounter (HOSPITAL_BASED_OUTPATIENT_CLINIC_OR_DEPARTMENT_OTHER)
Admission: RE | Disposition: A | Discharge status: Home / Self Care | Payer: Self-pay | Source: Ambulatory Visit | Attending: Surgery

## 2013-08-23 ENCOUNTER — Encounter (HOSPITAL_BASED_OUTPATIENT_CLINIC_OR_DEPARTMENT_OTHER): Payer: Self-pay | Admitting: Certified Registered"

## 2013-08-23 ENCOUNTER — Ambulatory Visit (HOSPITAL_BASED_OUTPATIENT_CLINIC_OR_DEPARTMENT_OTHER)
Admission: RE | Admit: 2013-08-23 | Discharge: 2013-08-23 | Disposition: A | Discharge status: Home / Self Care | Payer: 59 | Source: Ambulatory Visit | Attending: Surgery | Admitting: Surgery

## 2013-08-23 ENCOUNTER — Encounter (HOSPITAL_BASED_OUTPATIENT_CLINIC_OR_DEPARTMENT_OTHER): Payer: 59 | Admitting: Certified Registered"

## 2013-08-23 ENCOUNTER — Ambulatory Visit (HOSPITAL_BASED_OUTPATIENT_CLINIC_OR_DEPARTMENT_OTHER): Payer: 59 | Admitting: Certified Registered"

## 2013-08-23 ENCOUNTER — Other Ambulatory Visit: Payer: 59

## 2013-08-23 DIAGNOSIS — Z87891 Personal history of nicotine dependence: Secondary | ICD-10-CM | POA: Insufficient documentation

## 2013-08-23 DIAGNOSIS — Z880 Allergy status to penicillin: Secondary | ICD-10-CM | POA: Diagnosis not present

## 2013-08-23 DIAGNOSIS — F411 Generalized anxiety disorder: Secondary | ICD-10-CM | POA: Diagnosis not present

## 2013-08-23 DIAGNOSIS — C50919 Malignant neoplasm of unspecified site of unspecified female breast: Secondary | ICD-10-CM | POA: Diagnosis not present

## 2013-08-23 DIAGNOSIS — C50411 Malignant neoplasm of upper-outer quadrant of right female breast: Secondary | ICD-10-CM

## 2013-08-23 DIAGNOSIS — E669 Obesity, unspecified: Secondary | ICD-10-CM | POA: Insufficient documentation

## 2013-08-23 DIAGNOSIS — Z803 Family history of malignant neoplasm of breast: Secondary | ICD-10-CM | POA: Diagnosis not present

## 2013-08-23 HISTORY — DX: Snoring: R06.83

## 2013-08-23 HISTORY — PX: PORTACATH PLACEMENT: SHX2246

## 2013-08-23 HISTORY — DX: Presence of spectacles and contact lenses: Z97.3

## 2013-08-23 LAB — POCT HEMOGLOBIN-HEMACUE: Hemoglobin: 12 g/dL (ref 12.0–15.0)

## 2013-08-23 SURGERY — INSERTION, TUNNELED CENTRAL VENOUS DEVICE, WITH PORT
Anesthesia: General

## 2013-08-23 MED ORDER — HEPARIN (PORCINE) IN NACL 2-0.9 UNIT/ML-% IJ SOLN
INTRAMUSCULAR | Status: DC | PRN
  Administered 2013-08-23: 1 via INTRAVENOUS

## 2013-08-23 MED ORDER — ACETAMINOPHEN 325 MG PO TABS: 325.0000 mg | ORAL_TABLET | ORAL | Status: DC | PRN

## 2013-08-23 MED ORDER — LIDOCAINE HCL (CARDIAC) 20 MG/ML IV SOLN
INTRAVENOUS | Status: DC | PRN
  Administered 2013-08-23: 80 mg via INTRAVENOUS

## 2013-08-23 MED ORDER — HEPARIN SOD (PORK) LOCK FLUSH 100 UNIT/ML IV SOLN
INTRAVENOUS | Status: DC | PRN
  Administered 2013-08-23: 500 [IU] via INTRAVENOUS

## 2013-08-23 MED ORDER — FENTANYL CITRATE 0.05 MG/ML IJ SOLN
INTRAMUSCULAR | Status: AC
  Filled 2013-08-23: qty 6

## 2013-08-23 MED ORDER — CLINDAMYCIN PHOSPHATE 600 MG/50ML IV SOLN
600.0000 mg | Freq: Once | INTRAVENOUS | Status: AC
  Administered 2013-08-23: 600 mg via INTRAVENOUS

## 2013-08-23 MED ORDER — OXYCODONE HCL 5 MG/5ML PO SOLN: 5.0000 mg | Freq: Once | ORAL | Status: DC | PRN

## 2013-08-23 MED ORDER — FENTANYL CITRATE 0.05 MG/ML IJ SOLN: 50.0000 ug | INTRAMUSCULAR | Status: DC | PRN

## 2013-08-23 MED ORDER — HEPARIN (PORCINE) IN NACL 2-0.9 UNIT/ML-% IJ SOLN
INTRAMUSCULAR | Status: AC
  Filled 2013-08-23: qty 500

## 2013-08-23 MED ORDER — FENTANYL CITRATE 0.05 MG/ML IJ SOLN: 25.0000 ug | INTRAMUSCULAR | Status: DC | PRN

## 2013-08-23 MED ORDER — OXYCODONE HCL 5 MG PO TABS: 5.0000 mg | ORAL_TABLET | Freq: Once | ORAL | Status: DC | PRN

## 2013-08-23 MED ORDER — MIDAZOLAM HCL 2 MG/2ML IJ SOLN: 1.0000 mg | INTRAMUSCULAR | Status: DC | PRN

## 2013-08-23 MED ORDER — LACTATED RINGERS IV SOLN
INTRAVENOUS | Status: DC
  Administered 2013-08-23 (×2): via INTRAVENOUS

## 2013-08-23 MED ORDER — CLINDAMYCIN PHOSPHATE 600 MG/50ML IV SOLN
INTRAVENOUS | Status: AC
  Filled 2013-08-23: qty 50

## 2013-08-23 MED ORDER — ACETAMINOPHEN 160 MG/5ML PO SOLN: 325.0000 mg | ORAL | Status: DC | PRN

## 2013-08-23 MED ORDER — HEPARIN SOD (PORK) LOCK FLUSH 100 UNIT/ML IV SOLN
INTRAVENOUS | Status: AC
  Filled 2013-08-23: qty 5

## 2013-08-23 MED ORDER — FENTANYL CITRATE 0.05 MG/ML IJ SOLN
INTRAMUSCULAR | Status: DC | PRN
  Administered 2013-08-23: 50 ug via INTRAVENOUS
  Administered 2013-08-23: 25 ug via INTRAVENOUS
  Administered 2013-08-23: 50 ug via INTRAVENOUS

## 2013-08-23 MED ORDER — BUPIVACAINE-EPINEPHRINE 0.25% -1:200000 IJ SOLN
INTRAMUSCULAR | Status: DC | PRN
  Administered 2013-08-23: 10 mL

## 2013-08-23 MED ORDER — CHLORHEXIDINE GLUCONATE 4 % EX LIQD: 1.0000 "application " | Freq: Once | CUTANEOUS | Status: DC

## 2013-08-23 MED ORDER — MIDAZOLAM HCL 2 MG/2ML IJ SOLN
INTRAMUSCULAR | Status: AC
  Filled 2013-08-23: qty 2

## 2013-08-23 MED ORDER — ONDANSETRON HCL 4 MG/2ML IJ SOLN
INTRAMUSCULAR | Status: DC | PRN
  Administered 2013-08-23: 4 mg via INTRAVENOUS

## 2013-08-23 MED ORDER — MIDAZOLAM HCL 5 MG/5ML IJ SOLN
INTRAMUSCULAR | Status: DC | PRN
  Administered 2013-08-23: 2 mg via INTRAVENOUS

## 2013-08-23 MED ORDER — OXYCODONE-ACETAMINOPHEN 5-325 MG PO TABS: 1.0000 | ORAL_TABLET | ORAL | Status: DC | PRN

## 2013-08-23 MED ORDER — DEXAMETHASONE SODIUM PHOSPHATE 4 MG/ML IJ SOLN
INTRAMUSCULAR | Status: DC | PRN
  Administered 2013-08-23: 4 mg via INTRAVENOUS

## 2013-08-23 MED ORDER — PROPOFOL 10 MG/ML IV BOLUS
INTRAVENOUS | Status: DC | PRN
  Administered 2013-08-23: 160 mg via INTRAVENOUS

## 2013-08-23 SURGICAL SUPPLY — 61 items
ADH SKN CLS APL DERMABOND .7 (GAUZE/BANDAGES/DRESSINGS) ×1
APL SKNCLS STERI-STRIP NONHPOA (GAUZE/BANDAGES/DRESSINGS)
BAG DECANTER FOR FLEXI CONT (MISCELLANEOUS) ×2 IMPLANT
BENZOIN TINCTURE PRP APPL 2/3 (GAUZE/BANDAGES/DRESSINGS) IMPLANT
BLADE SURG 11 STRL SS (BLADE) ×2 IMPLANT
BLADE SURG 15 STRL LF DISP TIS (BLADE) ×1 IMPLANT
BLADE SURG 15 STRL SS (BLADE) ×2
CANISTER SUCT 1200ML W/VALVE (MISCELLANEOUS) IMPLANT
CHLORAPREP W/TINT 26ML (MISCELLANEOUS) ×2 IMPLANT
CLEANER CAUTERY TIP 5X5 PAD (MISCELLANEOUS) ×1 IMPLANT
COVER MAYO STAND STRL (DRAPES) ×2 IMPLANT
COVER TABLE BACK 60X90 (DRAPES) ×2 IMPLANT
DECANTER SPIKE VIAL GLASS SM (MISCELLANEOUS) IMPLANT
DERMABOND ADVANCED (GAUZE/BANDAGES/DRESSINGS) ×1
DERMABOND ADVANCED .7 DNX12 (GAUZE/BANDAGES/DRESSINGS) ×1 IMPLANT
DRAPE C-ARM 42X72 X-RAY (DRAPES) ×2 IMPLANT
DRAPE LAPAROSCOPIC ABDOMINAL (DRAPES) ×2 IMPLANT
DRAPE UTILITY XL STRL (DRAPES) ×2 IMPLANT
DRSG TEGADERM 2-3/8X2-3/4 SM (GAUZE/BANDAGES/DRESSINGS) IMPLANT
ELECT REM PT RETURN 9FT ADLT (ELECTROSURGICAL) ×2
ELECTRODE REM PT RTRN 9FT ADLT (ELECTROSURGICAL) ×1 IMPLANT
GLOVE BIOGEL PI IND STRL 7.0 (GLOVE) IMPLANT
GLOVE BIOGEL PI IND STRL 8 (GLOVE) ×1 IMPLANT
GLOVE BIOGEL PI INDICATOR 7.0 (GLOVE) ×1
GLOVE BIOGEL PI INDICATOR 8 (GLOVE) ×1
GLOVE ECLIPSE 6.5 STRL STRAW (GLOVE) ×1 IMPLANT
GLOVE ECLIPSE 8.0 STRL XLNG CF (GLOVE) ×2 IMPLANT
GLOVE EXAM NITRILE MD LF STRL (GLOVE) ×1 IMPLANT
GOWN STRL REUS W/ TWL LRG LVL3 (GOWN DISPOSABLE) ×2 IMPLANT
GOWN STRL REUS W/TWL LRG LVL3 (GOWN DISPOSABLE) ×4
IV KIT MINILOC 20X1 SAFETY (NEEDLE) IMPLANT
KIT PORT POWER 8FR ISP CVUE (Catheter) ×1 IMPLANT
NDL HYPO 25X1 1.5 SAFETY (NEEDLE) ×1 IMPLANT
NDL SAFETY ECLIPSE 18X1.5 (NEEDLE) IMPLANT
NDL SPNL 22GX3.5 QUINCKE BK (NEEDLE) IMPLANT
NEEDLE HYPO 18GX1.5 SHARP (NEEDLE)
NEEDLE HYPO 22GX1.5 SAFETY (NEEDLE) IMPLANT
NEEDLE HYPO 25X1 1.5 SAFETY (NEEDLE) ×2 IMPLANT
NEEDLE SPNL 22GX3.5 QUINCKE BK (NEEDLE) IMPLANT
PACK BASIN DAY SURGERY FS (CUSTOM PROCEDURE TRAY) ×2 IMPLANT
PAD CLEANER CAUTERY TIP 5X5 (MISCELLANEOUS) ×1
PENCIL BUTTON HOLSTER BLD 10FT (ELECTRODE) ×2 IMPLANT
SET SHEATH INTRODUCER 10FR (MISCELLANEOUS) IMPLANT
SHEATH COOK PEEL AWAY SET 9F (SHEATH) IMPLANT
SLEEVE SCD COMPRESS KNEE MED (MISCELLANEOUS) ×1 IMPLANT
SPONGE GAUZE 4X4 12PLY STER LF (GAUZE/BANDAGES/DRESSINGS) IMPLANT
SPONGE LAP 4X18 X RAY DECT (DISPOSABLE) ×1 IMPLANT
STRIP CLOSURE SKIN 1/2X4 (GAUZE/BANDAGES/DRESSINGS) IMPLANT
SUT MON AB 4-0 PC3 18 (SUTURE) ×2 IMPLANT
SUT PROLENE 2 0 CT2 30 (SUTURE) IMPLANT
SUT PROLENE 2 0 SH DA (SUTURE) ×2 IMPLANT
SUT SILK 2 0 TIES 17X18 (SUTURE)
SUT SILK 2-0 18XBRD TIE BLK (SUTURE) IMPLANT
SUT VIC AB 3-0 SH 27 (SUTURE) ×2
SUT VIC AB 3-0 SH 27X BRD (SUTURE) ×1 IMPLANT
SYR 5ML LUER SLIP (SYRINGE) ×2 IMPLANT
SYR CONTROL 10ML LL (SYRINGE) ×2 IMPLANT
TOWEL OR 17X24 6PK STRL BLUE (TOWEL DISPOSABLE) ×4 IMPLANT
TOWEL OR NON WOVEN STRL DISP B (DISPOSABLE) ×2 IMPLANT
TUBE CONNECTING 20X1/4 (TUBING) IMPLANT
YANKAUER SUCT BULB TIP NO VENT (SUCTIONS) IMPLANT

## 2013-08-23 NOTE — Transfer of Care (Signed)
Immediate Anesthesia Transfer of Care Note  Patient: [name]  Procedure(s) Performed: Procedure(s): INSERTION PORT-A-CATH WITH ULTRA SOUND (N/A)  Patient Location: PACU  Anesthesia Type:General  Level of Consciousness: awake, alert , oriented and patient cooperative  Airway & Oxygen Therapy: Patient Spontanous Breathing and Patient connected to face mask oxygen  Post-op Assessment: Report given to PACU RN and Post -op Vital signs reviewed and stable  Post vital signs: Reviewed and stable  Complications: No apparent anesthesia complications

## 2013-08-23 NOTE — Anesthesia Postprocedure Evaluation (Signed)
  Anesthesia Post-op Note  Patient: [name]  Procedure(s) Performed: Procedure(s): INSERTION PORT-A-CATH WITH ULTRA SOUND (N/A)  Patient Location: PACU  Anesthesia Type:General  Level of Consciousness: awake, alert  and oriented  Airway and Oxygen Therapy: Patient Spontanous Breathing  Post-op Pain: none  Post-op Assessment: Post-op Vital signs reviewed, Patient's Cardiovascular Status Stable, Respiratory Function Stable, Patent Airway, No signs of Nausea or vomiting and Pain level controlled  Post-op Vital Signs: Reviewed and stable  Last Vitals:  Filed Vitals:   08/23/13 1415  BP: 127/83  Pulse: 92  Temp:   Resp: 19    Complications: No apparent anesthesia complications

## 2013-08-23 NOTE — Discharge Instructions (Signed)
Implanted Port Home Guide °An implanted port is a type of central line that is placed under the skin. Central lines are used to provide IV access when treatment or nutrition needs to be given through a person's veins. Implanted ports are used for long-term IV access. An implanted port may be placed because:  °· You need IV medicine that would be irritating to the small veins in your hands or arms.   °· You need long-term IV medicines, such as antibiotics.   °· You need IV nutrition for a long period.   °· You need frequent blood draws for lab tests.   °· You need dialysis.   °Implanted ports are usually placed in the chest area, but they can also be placed in the upper arm, the abdomen, or the leg. An implanted port has two main parts:  °· Reservoir. The reservoir is round and will appear as a small, raised area under your skin. The reservoir is the part where a needle is inserted to give medicines or draw blood.   °· Catheter. The catheter is a thin, flexible tube that extends from the reservoir. The catheter is placed into a large vein. Medicine that is inserted into the reservoir goes into the catheter and then into the vein.   °HOW WILL I CARE FOR MY INCISION SITE? °Do not get the incision site wet. Bathe or shower as directed by your health care provider.  °HOW IS MY PORT ACCESSED? °Special steps must be taken to access the port:  °· Before the port is accessed, a numbing cream can be placed on the skin. This helps numb the skin over the port site.   °· Your health care provider uses a sterile technique to access the port. °· Your health care provider must put on a mask and sterile gloves. °· The skin over your port is cleaned carefully with an antiseptic and allowed to dry. °· The port is gently pinched between sterile gloves, and a needle is inserted into the port. °· Only "non-coring" port needles should be used to access the port. Once the port is accessed, a blood return should be checked. This helps  ensure that the port is in the vein and is not clogged.   °· If your port needs to remain accessed for a constant infusion, a clear (transparent) bandage will be placed over the needle site. The bandage and needle will need to be changed every week, or as directed by your health care provider.   °· Keep the bandage covering the needle clean and dry. Do not get it wet. Follow your health care provider's instructions on how to take a shower or bath while the port is accessed.   °· If your port does not need to stay accessed, no bandage is needed over the port.   °WHAT IS FLUSHING? °Flushing helps keep the port from getting clogged. Follow your health care provider's instructions on how and when to flush the port. Ports are usually flushed with saline solution or a medicine called heparin. The need for flushing will depend on how the port is used.  °· If the port is used for intermittent medicines or blood draws, the port will need to be flushed:   °· After medicines have been given.   °· After blood has been drawn.   °· As part of routine maintenance.   °· If a constant infusion is running, the port may not need to be flushed.   °HOW LONG WILL MY PORT STAY IMPLANTED? °The port can stay in for as long as your health care   provider thinks it is needed. When it is time for the port to come out, surgery will be done to remove it. The procedure is similar to the one performed when the port was put in.  °WHEN SHOULD I SEEK IMMEDIATE MEDICAL CARE? °When you have an implanted port, you should seek immediate medical care if:  °· You notice a bad smell coming from the incision site.   °· You have swelling, redness, or drainage at the incision site.   °· You have more swelling or pain at the port site or the surrounding area.   °· You have a fever that is not controlled with medicine. °Document Released: 12/29/2004 Document Revised: 10/19/2012 Document Reviewed: 09/05/2012 °ExitCare® Patient Information ©2015 [employer]. This  information is not intended to replace advice given to you by your health care provider. Make sure you discuss any questions you have with your health care provider. ° ° °Post Anesthesia Home Care Instructions ° °Activity: °Get plenty of rest for the remainder of the day. A responsible adult should stay with you for 24 hours following the procedure.  °For the next 24 hours, DO NOT: °-Drive a car °-Operate machinery °-Drink alcoholic beverages °-Take any medication unless instructed by your physician °-Make any legal decisions or sign important papers. ° °Meals: °Start with liquid foods such as gelatin or soup. Progress to regular foods as tolerated. Avoid greasy, spicy, heavy foods. If nausea and/or vomiting occur, drink only clear liquids until the nausea and/or vomiting subsides. Call your physician if vomiting continues. ° °Special Instructions/Symptoms: °Your throat may feel dry or sore from the anesthesia or the breathing tube placed in your throat during surgery. If this causes discomfort, gargle with warm salt water. The discomfort should disappear within 24 hours. ° °

## 2013-08-23 NOTE — Anesthesia Procedure Notes (Signed)
Procedure Name: LMA Insertion Date/Time: 08/23/2013 12:04 PM Performed by: Tattiana Fakhouri Pre-anesthesia Checklist: Patient identified, Emergency Drugs available, Suction available and Patient being monitored Patient Re-evaluated:Patient Re-evaluated prior to inductionOxygen Delivery Method: Circle System Utilized Preoxygenation: Pre-oxygenation with 100% oxygen Intubation Type: IV induction Ventilation: Mask ventilation without difficulty LMA: LMA inserted LMA Size: 4.0 Number of attempts: 1 Airway Equipment and Method: bite block Placement Confirmation: positive ETCO2 Tube secured with: Tape Dental Injury: Teeth and Oropharynx as per pre-operative assessment

## 2013-08-23 NOTE — Op Note (Signed)
[name] May 08, 1970 [id number] 08/23/2013   Preoperative diagnosis: PAC needed  Postoperative diagnosis: Same  Procedure: Portacath Placement with ultrasound guidance and floruoscopy   Surgeon: Erroll Luna, MD, FACS  Anesthesia: LMA and 0.25 % sensoricaine with epi  Clinical History and Indications: The patient is getting ready to begin chemotherapy for her cancer. She  needs a Port-A-Cath for venous access.  Description of Procedure: I have seen the patient in the holding area and confirmed the plans for the procedure as noted above. I reviewed the risks and complications again and the patient has no further questions. She wishes to proceed.   The patient was then taken to the operating room. After satisfactory LMA anesthesia had been obtained the upper chest and lower neck were prepped and draped as a sterile field. The timeout was done.  The right internal jugular vein was identified with ultrasound  Guidance and  entered with  the guidewire threaded into the superior vena cava right atrial area under fluoroscopic guidance. An incision was then made on the anterior chest wall and a subcutaneous pocket fashioned for the port reservoir. The blood return was dark and non pulsatile.  Wire fed into IVC.    The port tubing was then brought through a subcutaneous tunnel from the port site to the guidewire site. The dilator and peel-away sheath were then advanced over the guidewire while monitoring this with fluoroscopy. The guidewire and dilator were removed and the tubing threaded to approximately 22 cm. The peel-away sheath was then removed. The catheter aspirated and flushed easily. Using fluoroscopy the tip was backed out into the superior vena cava right atrial junction area. It aspirated and flushed easily. The reservoir was attached and the locking mechanism engaged. That aspirated and flushed easily.  The reservoir was secured to the fascia with 2 sutures of 2-0 Prolene. A final  check with fluoroscopy was done to make sure we had no kinks and good positioning of the tip of the catheter. Everything appeared to be okay. The catheter was aspirated, flushed with dilute heparin and then concentrated aqueous heparin.  The incision was then closed with interrupted 3-0 Vicryl, and 4-0 Monocryl subcuticular with Dermabond on the skin.  There were no operative complications. Estimated blood loss was minimal. All counts were correct. The patient tolerated the procedure well.  [name], MD, FACS 08/23/2013 12:55 PM

## 2013-08-23 NOTE — Interval H&P Note (Signed)
History and Physical Interval Note:  08/23/2013 11:21 AM  [name]  has presented today for surgery, with the diagnosis of cancer  The various methods of treatment have been discussed with the patient and family. After consideration of risks, benefits and other options for treatment, the patient has consented to  Procedure(s): INSERTION PORT-A-CATH WITH ULTRA SOUND (N/A) as a surgical intervention .  The patient's history has been reviewed, patient examined, no change in status, stable for surgery.  I have reviewed the patient's chart and labs.  Questions were answered to the patient's satisfaction.     [name] A.

## 2013-08-23 NOTE — Anesthesia Preprocedure Evaluation (Signed)
Anesthesia Evaluation  Patient identified by MRN, date of birth, ID band Patient awake    Reviewed: Allergy & Precautions, H&P , NPO status , Patient's Chart, lab work & pertinent test results  History of Anesthesia Complications Negative for: history of anesthetic complications  Airway Mallampati: II TM Distance: >3 FB Neck ROM: Full    Dental  (+) Teeth Intact   Pulmonary neg sleep apnea, neg COPDneg recent URI, former smoker,  breath sounds clear to auscultation        Cardiovascular negative cardio ROS  Rhythm:Regular     Neuro/Psych PSYCHIATRIC DISORDERS Anxiety negative neurological ROS     GI/Hepatic negative GI ROS, Neg liver ROS,   Endo/Other  neg diabetesMorbid obesity  Renal/GU negative Renal ROS     Musculoskeletal   Abdominal   Peds  Hematology negative hematology ROS (+)   Anesthesia Other Findings   Reproductive/Obstetrics                           Anesthesia Physical Anesthesia Plan  ASA: II  Anesthesia Plan: General   Post-op Pain Management:    Induction: Intravenous  Airway Management Planned: LMA  Additional Equipment: None  Intra-op Plan:   Post-operative Plan: Extubation in OR  Informed Consent: I have reviewed the patients History and Physical, chart, labs and discussed the procedure including the risks, benefits and alternatives for the proposed anesthesia with the patient or authorized representative who has indicated his/her understanding and acceptance.     Plan Discussed with: CRNA and Surgeon  Anesthesia Plan Comments:         Anesthesia Quick Evaluation

## 2013-08-23 NOTE — H&P (View-Only) (Signed)
Patient ID: [name], female   DOB: [birth year], 43 y.o.   MRN: [id number]  No chief complaint on file.   HPI [name] is a 43 y.o. female.  Pt sent at the request of Dr Gaetano Net for right breast cancer 2 cm lateral upper quadrant triple positive.  No symptoms and detected by mammogram.  Strong family history of ovarian and breast cancer. HPI  Past Medical History  Diagnosis Date  . Anxiety     History reviewed. No pertinent past surgical history.  Family History  Problem Relation Age of Onset  . Uterine cancer Mother   . Colon cancer Father   . Leukemia Maternal Grandmother   . Melanoma Paternal Grandmother   . Prostate cancer Cousin   . Prostate cancer Other     Social History History  Substance Use Topics  . Smoking status: Former Smoker    Quit date: 02/20/2013  . Smokeless tobacco: Not on file  . Alcohol Use: Yes     Comment: 1/week    Allergies  Allergen Reactions  . Amoxicillin Rash    Current Outpatient Prescriptions  Medication Sig Dispense Refill  . Calcium-Vitamin D-Vitamin K (CALCIUM SOFT CHEWS PO) Take by mouth daily.      . Multiple Vitamins-Minerals (MULTI ADULT GUMMIES PO) Take 2 each by mouth daily.       No current facility-administered medications for this visit.    Review of Systems Review of Systems  Constitutional: Negative for fever, chills and unexpected weight change.  HENT: Negative for congestion, hearing loss, sore throat, trouble swallowing and voice change.   Eyes: Negative for visual disturbance.  Respiratory: Negative for cough and wheezing.   Cardiovascular: Negative for chest pain, palpitations and leg swelling.  Gastrointestinal: Negative for nausea, vomiting, abdominal pain, diarrhea, constipation, blood in stool, abdominal distention and anal bleeding.  Genitourinary: Negative for hematuria, vaginal bleeding and difficulty urinating.  Musculoskeletal: Negative for arthralgias.  Skin: Negative for rash and wound.   Neurological: Negative for seizures, syncope and headaches.  Hematological: Negative for adenopathy. Does not bruise/bleed easily.  Psychiatric/Behavioral: Negative for confusion.    There were no vitals taken for this visit.  Physical Exam Physical Exam  Constitutional: She is oriented to person, place, and time. She appears well-developed and well-nourished.  HENT:  Head: Normocephalic and atraumatic.  Eyes: Conjunctivae are normal. Pupils are equal, round, and reactive to light.  Neck: Normal range of motion.  Cardiovascular: Normal rate and regular rhythm.   Pulmonary/Chest: Effort normal. Right breast exhibits no inverted nipple, no mass, no nipple discharge, no skin change and no tenderness. Left breast exhibits no inverted nipple, no mass, no nipple discharge, no skin change and no tenderness. Breasts are symmetrical.  Musculoskeletal: Normal range of motion.  Lymphadenopathy:    She has no cervical adenopathy.  Neurological: She is alert and oriented to person, place, and time.  Skin: Skin is warm and dry.  Psychiatric: She has a normal mood and affect. Her behavior is normal. Judgment and thought content normal.    Data Reviewed CLINICAL DATA: Recently diagnosed right breast invasive mammary  carcinoma on ultrasound-guided core biopsy. Preoperative evaluation.  EXAM:  BILATERAL BREAST MRI WITH AND WITHOUT CONTRAST  TECHNIQUE:  Multiplanar, multisequence MR images of both breasts were obtained  prior to and following the intravenous administration of 93m of  MultiHance  THREE-DIMENSIONAL MR IMAGE RENDERING ON INDEPENDENT WORKSTATION:  Three-dimensional MR images were rendered by post-processing of the  original MR data on an  independent workstation. The  three-dimensional MR images were interpreted, and findings are  reported in the following complete MRI report for this study. Three  dimensional images were evaluated at the independent DynaCad  workstation   COMPARISON: 08/08/2013 and 07/28/2013 mammograms as well as  07/28/2013 right breast ultrasound.  FINDINGS:  Breast composition: b. Scattered fibroglandular tissue.  Background parenchymal enhancement: Mild  Right breast: There is a 2.0 x 1.4 x 1.4 cm irregular enhancing mass  with central clip artifact located within the upper outer quadrant  of the right breast (middle 1/3). This is associated with a mixture  of wash in / washout and plateau enhancement kinetics. This  corresponds to the known invasive mammary carcinoma. There is mild  adjacent post biopsy change. There are no additional and worrisome  enhancing foci within the right breast.  Left breast: No mass or abnormal enhancement  Lymph nodes: No abnormal appearing lymph nodes.  Ancillary findings: None.  IMPRESSION:  2.0 cm irregular enhancing mass with central clip artifact located  within the upper outer quadrant of the right breast corresponding to  the recently diagnosed invasive mammary carcinoma. No additional  findings.  RECOMMENDATION:  Treatment plan.  BI-RADS CATEGORY 6: Known biopsy-proven malignancy.  Electronically Signed  By: Luberta Robertson M.D.  On: 08/15/2013 10:09  Triple positive  Assessment    Stage 2 right breast cancer stage 2 Strong family history    Plan    Neoadjuvant chemotherapy and port placement Genetics Breast conservation down the road if BRCA negative Port risk and benefits discussed.  Bleeding ,  Infection,   Pneumthorax,  Hemothorax catheter migration,  catheter replacement and death.  Alternatives to port discussed.  She agrees to proceed.        Shawnee Higham A. 08/16/2013, 10:18 AM

## 2013-08-24 ENCOUNTER — Encounter (HOSPITAL_BASED_OUTPATIENT_CLINIC_OR_DEPARTMENT_OTHER): Payer: Self-pay | Admitting: Surgery

## 2013-08-25 ENCOUNTER — Other Ambulatory Visit: Payer: 59

## 2013-08-25 ENCOUNTER — Ambulatory Visit (HOSPITAL_BASED_OUTPATIENT_CLINIC_OR_DEPARTMENT_OTHER): Payer: 59 | Admitting: Genetic Counselor

## 2013-08-25 ENCOUNTER — Encounter: Payer: Self-pay | Admitting: Genetic Counselor

## 2013-08-25 ENCOUNTER — Ambulatory Visit (HOSPITAL_COMMUNITY)
Admission: RE | Admit: 2013-08-25 | Discharge: 2013-08-25 | Disposition: A | Discharge status: Home / Self Care | Payer: 59 | Source: Ambulatory Visit | Attending: Internal Medicine | Admitting: Internal Medicine

## 2013-08-25 DIAGNOSIS — C50919 Malignant neoplasm of unspecified site of unspecified female breast: Secondary | ICD-10-CM | POA: Insufficient documentation

## 2013-08-25 DIAGNOSIS — C50419 Malignant neoplasm of upper-outer quadrant of unspecified female breast: Secondary | ICD-10-CM | POA: Insufficient documentation

## 2013-08-25 DIAGNOSIS — C50411 Malignant neoplasm of upper-outer quadrant of right female breast: Secondary | ICD-10-CM

## 2013-08-25 DIAGNOSIS — I517 Cardiomegaly: Secondary | ICD-10-CM

## 2013-08-25 DIAGNOSIS — IMO0002 Reserved for concepts with insufficient information to code with codable children: Secondary | ICD-10-CM

## 2013-08-25 DIAGNOSIS — Z809 Family history of malignant neoplasm, unspecified: Secondary | ICD-10-CM

## 2013-08-25 NOTE — Progress Notes (Signed)
Patient Name: [name] Patient Age: 43 y.o. Encounter Date: 08/25/2013  Referring Physician: Lurline Del, MD  Primary Care Provider: Horatio Pel, MD   [name], a 43 y.o. female, is being seen at the [facility] due to a personal history of breast cancer and family history of various cancers.  She presents to clinic today with her mother to discuss the possibility of a hereditary predisposition to cancer and discuss whether genetic testing is warranted.  HISTORY OF PRESENT ILLNESS: [name] was recently diagnosed with right breast cancer (IDC) at the age of 89. The breast tumor was ER positive, PR positive, and HER2 positive.She had her port placed on 8/12 and will soon start neoadjuvant chemotherapy. Surgery will be determined after results of genetic testing are obtained.    Past Medical History  Diagnosis Date  . Anxiety   . Wears contact lenses   . Snores   . Malignant neoplasm of breast (female), unspecified site     Past Surgical History  Procedure Laterality Date  . Wisdom tooth extraction    . Portacath placement N/A 08/23/2013    Procedure: INSERTION PORT-A-CATH WITH ULTRA SOUND;  Surgeon: Joyice Faster. Cornett, MD;  Location: [place];  Service: General;  Laterality: N/A;    History   Social History  . Marital Status: Single    Spouse Name: N/A    Number of Children: N/A  . Years of Education: N/A   Social History Main Topics  . Smoking status: Former Smoker    Quit date: 02/20/2013  . Smokeless tobacco: Not on file  . Alcohol Use: Yes     Comment: 1/week  . Drug Use: No  . Sexual Activity: Not on file   Other Topics Concern  . Not on file   Social History Narrative  . No narrative on file     FAMILY HISTORY:   During the visit, a 4-generation pedigree was obtained. Significant diagnoses include the following:  Family History  Problem Relation Age of Onset  . Uterine cancer Mother 43    TAH/BSO   . Colon cancer Father 63    deceased 44  . Leukemia Maternal Grandmother     deceased 77  . Skin cancer Paternal Grandmother   . Prostate cancer Other     [name] has no siblings and no children. She has two maternal aunts and 3 paternal aunts, as well as numerous female cousins. There are no women in her family with breast or ovarian cancers.  [name] ancestry is African American. There is no known Jewish ancestry and no consanguinity.  ASSESSMENT AND PLAN: [name] is a 43 y.o. female with a personal history of breast cancer at age 26 and family history of various cancers as noted above. This history is not highly suggestive of a hereditary predisposition to cancer. We reviewed the characteristics, features and inheritance patterns of hereditary cancer syndromes. We also discussed genetic testing, including the appropriate family members to test, the process of testing, insurance coverage and implications of results. A negative result will be very reassuring for [name].  [name] wished to pursue genetic testing and a blood sample will be sent to [facility] for analysis of the 17 genes on the BreastNext gene panel. We discussed the implications of a positive, negative and/ or Variant of Uncertain Significance (VUS) result. Results should be available in approximately 4-5 weeks, at which point we will contact her and address implications for her  as well as address genetic testing for at-risk family members, if needed.    We encouraged [name] to remain in contact with Cancer Genetics annually so that we can update the family history and inform her of any changes in cancer genetics and testing that may be of benefit for this family. Ms.  [name] questions were answered to her satisfaction today.   Thank you for the referral and allowing [place] to share in the care of your patient.   The patient was seen for a total of 30 minutes, greater than 50% of which was spent face-to-face  counseling. This patient was discussed with the overseeing provider who agrees with the above.   Steele Berg, MS, [place] Certified Genetic Counseor phone: [phone] [email or web address]

## 2013-08-25 NOTE — Progress Notes (Signed)
  Echocardiogram 2D Echocardiogram has been performed.  Para Cossey 08/25/2013, 1:03 PM

## 2013-08-30 ENCOUNTER — Telehealth: Payer: Self-pay | Admitting: *Deleted

## 2013-08-30 NOTE — Telephone Encounter (Signed)
Receive request from Dr. Jana Hakim to move pt to 11am same day - 8/21.  Called pt and she was fine with that.  Changed appt time in system. Emailed Dr. Jana Hakim to make him aware.

## 2013-09-01 ENCOUNTER — Ambulatory Visit (HOSPITAL_BASED_OUTPATIENT_CLINIC_OR_DEPARTMENT_OTHER): Payer: 59 | Admitting: Oncology

## 2013-09-01 ENCOUNTER — Telehealth: Payer: Self-pay | Admitting: Oncology

## 2013-09-01 ENCOUNTER — Telehealth: Payer: Self-pay | Admitting: *Deleted

## 2013-09-01 VITALS — BP 123/75 | HR 96 | Temp 98.3°F | Resp 18 | Ht 65.0 in | Wt 312.5 lb

## 2013-09-01 DIAGNOSIS — C50919 Malignant neoplasm of unspecified site of unspecified female breast: Secondary | ICD-10-CM

## 2013-09-01 DIAGNOSIS — Z170 Estrogen receptor positive status [ER+]: Secondary | ICD-10-CM

## 2013-09-01 DIAGNOSIS — C50419 Malignant neoplasm of upper-outer quadrant of unspecified female breast: Secondary | ICD-10-CM

## 2013-09-01 DIAGNOSIS — C50411 Malignant neoplasm of upper-outer quadrant of right female breast: Secondary | ICD-10-CM

## 2013-09-01 MED ORDER — DEXAMETHASONE 4 MG PO TABS: 8.0000 mg | ORAL_TABLET | Freq: Two times a day (BID) | ORAL | Status: DC

## 2013-09-01 MED ORDER — LORAZEPAM 0.5 MG PO TABS: 0.5000 mg | ORAL_TABLET | Freq: Every evening | ORAL | Status: DC | PRN

## 2013-09-01 MED ORDER — LIDOCAINE-PRILOCAINE 2.5-2.5 % EX CREA: 1.0000 "application " | TOPICAL_CREAM | CUTANEOUS | Status: DC | PRN

## 2013-09-01 MED ORDER — ONDANSETRON HCL 8 MG PO TABS: 8.0000 mg | ORAL_TABLET | Freq: Two times a day (BID) | ORAL | Status: DC

## 2013-09-01 MED ORDER — PROCHLORPERAZINE MALEATE 10 MG PO TABS: 10.0000 mg | ORAL_TABLET | Freq: Four times a day (QID) | ORAL | Status: DC | PRN

## 2013-09-01 NOTE — Progress Notes (Signed)
[facility]  [phone] [phone]     ID: [name] DOB: 07-Aug-1970  MR#: [id number]  XKG#:[id number]  Patient Care Team: Horatio Pel, MD as PCP - General (Internal Medicine) Joyice Faster. Cornett, MD as Consulting Physician (General Surgery) Chauncey Cruel, MD as Consulting Physician (Oncology) Rexene Edison, MD as Consulting Physician (Radiation Oncology) Allena Katz, MD as Consulting Physician (Obstetrics and Gynecology)  CHIEF COMPLAINT: Newly diagnosed triple positive breast cancer  CURRENT TREATMENT: To start neoadjuvant chemotherapy   BREAST CANCER HISTORY: From the original intake note:  The patient herself palpated a mass in her right breast mid July. She brought it to Dr. Sherlynn Stalls attention and he set the patient up for bilateral diagnostic mammography and right breast ultrasonography of the breast Center 07/28/2013. Right mammogram showed an irregular mass in the upper outer quadrant measuring 1.4 cm. There was some associated pleomorphic microcalcifications. This was palpable, at the 10:30 position 11 cm from the nipple. By palpation it measured approximately 1.5 cm. Ultrasound confirmed an hypoechoic mass with irregular borders measuring 1.1 cm. The right axilla showed a few adjacent deep lymph nodes with mild cortical thickening.  On 08/08/2013 the patient underwent biopsy of the breast mass in question. The pathology ([id number] [id number]) showed an invasive ductal carcinoma, grade 2, estrogen receptor 49% positive, progesterone receptor 47% positive, both with moderate staining intensity, with an MIB-1 of 52%, and HER-2 amplification with a signals ratio of 8.1. The right axillary lymph node biopsied at the same time was benign this was felt to be possibly concordant.  And 08/15/2013 the patient underwent bilateral breast MRI showing a 2 cm irregular enhancing mass in the upper outer quadrant of the right breast. There were no  additional changes no abnormal appearing lymph nodes and no masses or abnormalities in the left breast.  The patient's subsequent history is as detailed below  INTERVAL HISTORY: [name] returns today for followup of her breast cancer. Since her last visit here she had an echocardiogram, with a very favorable ejection fraction. She had her port placed, without any complications. She also came to "chemotherapy school" and had a tour of the treatment area  REVIEW OF SYSTEMS: The port is high in the right anterior chest wall and reaches to her right neck. This makes it difficult for her to sleep on her right side, which is her preferred sleep position. Aside from that a detailed review of systems today was noncontributory  PAST MEDICAL HISTORY: Past Medical History  Diagnosis Date  . Anxiety   . Wears contact lenses   . Snores   . Malignant neoplasm of breast (female), unspecified site     PAST SURGICAL HISTORY: Past Surgical History  Procedure Laterality Date  . Wisdom tooth extraction    . Portacath placement N/A 08/23/2013    Procedure: INSERTION PORT-A-CATH WITH ULTRA SOUND;  Surgeon: Joyice Faster. Cornett, MD;  Location: [place];  Service: General;  Laterality: N/A;    FAMILY HISTORY Family History  Problem Relation Age of Onset  . Uterine cancer Mother 66    TAH/BSO  . Colon cancer Father 59    deceased 9  . Leukemia Maternal Grandmother     deceased 51  . Skin cancer Paternal Grandmother   . Prostate cancer Other    the patient's father died from colon cancer the age of 64. It had been diagnosed 3 years prior. The patient's mother was diagnosed with cancer of the uterus  at age 30. She is 44 years old as of August 2015. The patient had no brothers or sisters. There is a history of prostate cancer leukemia at in the family as well as melanoma, but there is no history of breast or ovarian cancer in the family.  GYNECOLOGIC HISTORY:  Patient's last menstrual  period was 08/02/2013. Menarche age 43. The patient is GX P0. She is still having regular periods. 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 She lives alone, with no pets.    ADVANCED DIRECTIVES: Not in place. At her 08/16/2013 visit the patient was given the upper. Documents 2 complete and notarize so she may name her mother, [name] focal as her healthcare power of attorney, which is the patient's intention. Ms. focal can be reached at [phone]. She currently lives in [place] but is planning to move to [place]: History  Substance Use Topics  . Smoking status: Former Smoker    Quit date: 02/20/2013  . Smokeless tobacco: Not on file  . Alcohol Use: Yes     Comment: 1/week     Colonoscopy: Never  PAP: July 2015 Bone density: Never  Lipid panel:  Allergies  Allergen Reactions  . Amoxicillin Rash    Current Outpatient Prescriptions  Medication Sig Dispense Refill  . Calcium-Vitamin D-Vitamin K (CALCIUM SOFT CHEWS PO) Take by mouth daily.      . Multiple Vitamins-Minerals (MULTI ADULT GUMMIES PO) Take 2 each by mouth daily.      Marland Kitchen oxyCODONE-acetaminophen (ROXICET) 5-325 MG per tablet Take 1 tablet by mouth every 4 (four) hours as needed.  30 tablet  0   No current facility-administered medications for this visit.    OBJECTIVE: Middle-aged [place] American woman who appears stated age 43 Vitals:   09/01/13 1112  BP: 123/75  Pulse: 96  Temp: 98.3 F (36.8 C)  Resp: 18     Body mass index is 52 kg/(m^2).    ECOG FS:1 - Symptomatic but completely ambulatory  Ocular: Sclerae unicteric, EOMs intact Ear-nose-throat: Oropharynx clear and moist Lymphatic: No cervical or supraclavicular adenopathy Lungs no rales or rhonchi Heart regular rate and rhythm Abd soft, obese, nontender, positive bowel sounds MSK no focal spinal  tenderness, no upper extremity lymphedema Neuro: non-focal, well-oriented, positive affect Breasts: I do not palpate a clearly defined mass in the right breast. The right axilla is benign. The left breast is unremarkable.   LAB RESULTS:  CMP     Component Value Date/Time   NA 140 08/16/2013 0759   K 3.8 08/16/2013 0759   CO2 24 08/16/2013 0759   GLUCOSE 113 08/16/2013 0759   BUN 7.7 08/16/2013 0759   CREATININE 0.8 08/16/2013 0759   CALCIUM 9.1 08/16/2013 0759   PROT 7.1 08/16/2013 0759   ALBUMIN 3.4* 08/16/2013 0759   AST 20 08/16/2013 0759   ALT 20 08/16/2013 0759   ALKPHOS 76 08/16/2013 0759   BILITOT 0.24 08/16/2013 0759    I No results found for this basename: SPEP,  UPEP,   kappa and lambda light chains    Lab Results  Component Value Date   WBC 8.2 08/16/2013   NEUTROABS 4.2 08/16/2013   HGB 12.0 08/23/2013   HCT 38.2 08/16/2013   MCV 83.8 08/16/2013   PLT 341 08/16/2013      Chemistry      Component  Value Date/Time   NA 140 08/16/2013 0759   K 3.8 08/16/2013 0759   CO2 24 08/16/2013 0759   BUN 7.7 08/16/2013 0759   CREATININE 0.8 08/16/2013 0759      Component Value Date/Time   CALCIUM 9.1 08/16/2013 0759   ALKPHOS 76 08/16/2013 0759   AST 20 08/16/2013 0759   ALT 20 08/16/2013 0759   BILITOT 0.24 08/16/2013 0759       No results found for this basename: LABCA2    No components found with this basename: LABCA125    No results found for this basename: INR,  in the last 168 hours  Urinalysis No results found for this basename: colorurine,  appearanceur,  labspec,  phurine,  glucoseu,  hgbur,  bilirubinur,  ketonesur,  proteinur,  urobilinogen,  nitrite,  leukocytesur    STUDIES: Mr Breast Bilateral W Wo Contrast  08/15/2013   CLINICAL DATA:  Recently diagnosed right breast invasive mammary carcinoma on ultrasound-guided core biopsy. Preoperative evaluation.  EXAM: BILATERAL BREAST MRI WITH AND WITHOUT CONTRAST  TECHNIQUE: Multiplanar, multisequence MR images of both breasts were obtained prior to  and following the intravenous administration of 70m of MultiHance  THREE-DIMENSIONAL MR IMAGE RENDERING ON INDEPENDENT WORKSTATION:  Three-dimensional MR images were rendered by post-processing of the original MR data on an independent workstation. The three-dimensional MR images were interpreted, and findings are reported in the following complete MRI report for this study. Three dimensional images were evaluated at the independent DynaCad workstation  COMPARISON:  08/08/2013 and 07/28/2013 mammograms as well as 07/28/2013 right breast ultrasound.  FINDINGS: Breast composition: b.  Scattered fibroglandular tissue.  Background parenchymal enhancement: Mild  Right breast: There is a 2.0 x 1.4 x 1.4 cm irregular enhancing mass with central clip artifact located within the upper outer quadrant of the right breast (middle 1/3). This is associated with a mixture of wash in / washout and plateau enhancement kinetics. This corresponds to the known invasive mammary carcinoma. There is mild adjacent post biopsy change. There are no additional and worrisome enhancing foci within the right breast.  Left breast: No mass or abnormal enhancement  Lymph nodes: No abnormal appearing lymph nodes.  Ancillary findings:  None.  IMPRESSION: 2.0 cm irregular enhancing mass with central clip artifact located within the upper outer quadrant of the right breast corresponding to the recently diagnosed invasive mammary carcinoma. No additional findings.  RECOMMENDATION: Treatment plan.  BI-RADS CATEGORY  6: Known biopsy-proven malignancy.   Electronically Signed   By: RLuberta RobertsonM.D.   On: 08/15/2013 10:09    ASSESSMENT: 43y.o. [place] woman status post right breast upper outer quadrant biopsy 08/08/2013 for a triple positive invasive ductal carcinoma, grade 2, with an MIB-1 of 52%, and a suspicious lymph node biopsied at the same time pathologically benign.  (1) genetics testing pending  (2) neoadjuvant chemotherapy to  consist of carboplatin, docetaxel, trastuzumab and pertuzumab for 6 cycles, starting 09/07/2013  PLAN: We spent the better part of today's 40 minute visit going over her antinausea and other supportive medications. I placed all the prescriptions in and I gave her a "map is "on how to take her medications. Specifically she will be on dexamethasone 8 mg twice a day the day before chemotherapy and then days 2 and 3 and the morning of day 4. She will start Zofran the evening of chemotherapy and continue through day 4. She will also start Compazine in the evening of chemotherapy and continue 4 times a day or  a.c. and at bedtime for the first 3 days. She has lorazepam for insomnia problems and of course the EMLA cream to numb the port. She understands the need to cover it with plastic wrap so that it does not lose its effectiveness by desiccation.  She is status he is on the day of treatment and the following week, with lab work. The first day 8 visit particularly we should be able to troubleshoot any problems that occurred on day 1. After 3 cycles she will have a repeat breast MRI, and this has been scheduled. She will see me with the first day of cycle 4 to review those results.  The patient has a good understanding of the overall plan. She agrees with it. She knows the goal of treatment in her case is cure. She will call with any problems that may develop before her next visit here.  Chauncey Cruel, MD   09/01/2013 11:28 AM

## 2013-09-01 NOTE — Telephone Encounter (Signed)
per pof to sch pt appt-gave pt sch-pt adv to call GI for MRI after cycle in OCT-pt understood

## 2013-09-01 NOTE — Telephone Encounter (Signed)
Per staff message and POF I have scheduled appts. Advised scheduler of appts. JMW  

## 2013-09-06 ENCOUNTER — Other Ambulatory Visit: Payer: Self-pay | Admitting: *Deleted

## 2013-09-06 DIAGNOSIS — C50411 Malignant neoplasm of upper-outer quadrant of right female breast: Secondary | ICD-10-CM

## 2013-09-07 ENCOUNTER — Encounter: Payer: Self-pay | Admitting: Adult Health

## 2013-09-07 ENCOUNTER — Other Ambulatory Visit: Payer: 59

## 2013-09-07 ENCOUNTER — Ambulatory Visit: Payer: 59 | Admitting: Nurse Practitioner

## 2013-09-07 ENCOUNTER — Other Ambulatory Visit: Payer: Self-pay | Admitting: Oncology

## 2013-09-07 ENCOUNTER — Ambulatory Visit (HOSPITAL_BASED_OUTPATIENT_CLINIC_OR_DEPARTMENT_OTHER): Payer: 59 | Admitting: Adult Health

## 2013-09-07 ENCOUNTER — Ambulatory Visit (HOSPITAL_BASED_OUTPATIENT_CLINIC_OR_DEPARTMENT_OTHER): Payer: 59

## 2013-09-07 ENCOUNTER — Telehealth: Payer: Self-pay | Admitting: *Deleted

## 2013-09-07 ENCOUNTER — Other Ambulatory Visit: Payer: Self-pay | Admitting: *Deleted

## 2013-09-07 ENCOUNTER — Telehealth: Payer: Self-pay | Admitting: Adult Health

## 2013-09-07 VITALS — BP 130/73 | HR 103 | Temp 98.3°F | Resp 18 | Ht 65.0 in | Wt 313.1 lb

## 2013-09-07 VITALS — BP 132/78 | HR 82 | Temp 98.2°F | Resp 18

## 2013-09-07 DIAGNOSIS — C50419 Malignant neoplasm of upper-outer quadrant of unspecified female breast: Secondary | ICD-10-CM

## 2013-09-07 DIAGNOSIS — C50411 Malignant neoplasm of upper-outer quadrant of right female breast: Secondary | ICD-10-CM

## 2013-09-07 DIAGNOSIS — Z5111 Encounter for antineoplastic chemotherapy: Secondary | ICD-10-CM

## 2013-09-07 DIAGNOSIS — Z170 Estrogen receptor positive status [ER+]: Secondary | ICD-10-CM

## 2013-09-07 DIAGNOSIS — Z5112 Encounter for antineoplastic immunotherapy: Secondary | ICD-10-CM

## 2013-09-07 DIAGNOSIS — C50919 Malignant neoplasm of unspecified site of unspecified female breast: Secondary | ICD-10-CM

## 2013-09-07 LAB — CBC WITH DIFFERENTIAL/PLATELET
BASO%: 0.4 % (ref 0.0–2.0)
Basophils Absolute: 0.1 10*3/uL (ref 0.0–0.1)
EOS%: 0 % (ref 0.0–7.0)
Eosinophils Absolute: 0 10*3/uL (ref 0.0–0.5)
HCT: 40.4 % (ref 34.8–46.6)
HGB: 12.8 g/dL (ref 11.6–15.9)
LYMPH%: 13.4 % — ABNORMAL LOW (ref 14.0–49.7)
MCH: 26.7 pg (ref 25.1–34.0)
MCHC: 31.7 g/dL (ref 31.5–36.0)
MCV: 84.2 fL (ref 79.5–101.0)
MONO#: 0.1 10*3/uL (ref 0.1–0.9)
MONO%: 0.7 % (ref 0.0–14.0)
NEUT#: 11.9 10*3/uL — ABNORMAL HIGH (ref 1.5–6.5)
NEUT%: 85.5 % — AB (ref 38.4–76.8)
Platelets: 366 10*3/uL (ref 145–400)
RBC: 4.79 10*6/uL (ref 3.70–5.45)
RDW: 16 % — ABNORMAL HIGH (ref 11.2–14.5)
WBC: 13.9 10*3/uL — ABNORMAL HIGH (ref 3.9–10.3)
lymph#: 1.9 10*3/uL (ref 0.9–3.3)

## 2013-09-07 LAB — COMPREHENSIVE METABOLIC PANEL (CC13)
ALT: 23 U/L (ref 0–55)
ANION GAP: 11 meq/L (ref 3–11)
AST: 18 U/L (ref 5–34)
Albumin: 3.5 g/dL (ref 3.5–5.0)
Alkaline Phosphatase: 90 U/L (ref 40–150)
BUN: 18.6 mg/dL (ref 7.0–26.0)
CO2: 20 meq/L — AB (ref 22–29)
Calcium: 9.7 mg/dL (ref 8.4–10.4)
Chloride: 105 mEq/L (ref 98–109)
Creatinine: 0.9 mg/dL (ref 0.6–1.1)
GLUCOSE: 181 mg/dL — AB (ref 70–140)
Potassium: 4.3 mEq/L (ref 3.5–5.1)
SODIUM: 137 meq/L (ref 136–145)
TOTAL PROTEIN: 8 g/dL (ref 6.4–8.3)
Total Bilirubin: 0.25 mg/dL (ref 0.20–1.20)

## 2013-09-07 MED ORDER — UNABLE TO FIND: 1.0000 | Freq: Once | Status: DC

## 2013-09-07 MED ORDER — DOCETAXEL CHEMO INJECTION 160 MG/16ML
75.0000 mg/m2 | Freq: Once | INTRAVENOUS | Status: AC
  Administered 2013-09-07: 190 mg via INTRAVENOUS
  Filled 2013-09-07: qty 19

## 2013-09-07 MED ORDER — ACETAMINOPHEN 325 MG PO TABS
650.0000 mg | ORAL_TABLET | Freq: Once | ORAL | Status: AC
  Administered 2013-09-07: 650 mg via ORAL

## 2013-09-07 MED ORDER — DEXAMETHASONE SODIUM PHOSPHATE 20 MG/5ML IJ SOLN
INTRAMUSCULAR | Status: AC
  Filled 2013-09-07: qty 5

## 2013-09-07 MED ORDER — DEXAMETHASONE SODIUM PHOSPHATE 20 MG/5ML IJ SOLN
20.0000 mg | Freq: Once | INTRAMUSCULAR | Status: AC
  Administered 2013-09-07: 20 mg via INTRAVENOUS

## 2013-09-07 MED ORDER — ACETAMINOPHEN 325 MG PO TABS
ORAL_TABLET | ORAL | Status: AC
  Filled 2013-09-07: qty 2

## 2013-09-07 MED ORDER — ONDANSETRON 16 MG/50ML IVPB (CHCC)
INTRAVENOUS | Status: AC
  Filled 2013-09-07: qty 16

## 2013-09-07 MED ORDER — SODIUM CHLORIDE 0.9 % IJ SOLN
10.0000 mL | INTRAMUSCULAR | Status: DC | PRN
  Administered 2013-09-07: 10 mL
  Filled 2013-09-07: qty 10

## 2013-09-07 MED ORDER — HEPARIN SOD (PORK) LOCK FLUSH 100 UNIT/ML IV SOLN
500.0000 [IU] | Freq: Once | INTRAVENOUS | Status: AC | PRN
  Administered 2013-09-07: 500 [IU]
  Filled 2013-09-07: qty 5

## 2013-09-07 MED ORDER — SODIUM CHLORIDE 0.9 % IV SOLN
Freq: Once | INTRAVENOUS | Status: AC
  Administered 2013-09-07: 10:00:00 via INTRAVENOUS

## 2013-09-07 MED ORDER — SODIUM CHLORIDE 0.9 % IV SOLN
840.0000 mg | Freq: Once | INTRAVENOUS | Status: AC
  Administered 2013-09-07: 840 mg via INTRAVENOUS
  Filled 2013-09-07: qty 28

## 2013-09-07 MED ORDER — TRASTUZUMAB CHEMO INJECTION 440 MG
8.0000 mg/kg | Freq: Once | INTRAVENOUS | Status: AC
  Administered 2013-09-07: 1134 mg via INTRAVENOUS
  Filled 2013-09-07: qty 54

## 2013-09-07 MED ORDER — DIPHENHYDRAMINE HCL 25 MG PO CAPS
25.0000 mg | ORAL_CAPSULE | Freq: Once | ORAL | Status: AC
  Administered 2013-09-07: 25 mg via ORAL

## 2013-09-07 MED ORDER — SODIUM CHLORIDE 0.9 % IV SOLN
750.0000 mg | Freq: Once | INTRAVENOUS | Status: AC
  Administered 2013-09-07: 750 mg via INTRAVENOUS
  Filled 2013-09-07: qty 75

## 2013-09-07 MED ORDER — DIPHENHYDRAMINE HCL 25 MG PO CAPS
ORAL_CAPSULE | ORAL | Status: AC
  Filled 2013-09-07: qty 2

## 2013-09-07 MED ORDER — ONDANSETRON 16 MG/50ML IVPB (CHCC)
16.0000 mg | Freq: Once | INTRAVENOUS | Status: AC
  Administered 2013-09-07: 16 mg via INTRAVENOUS

## 2013-09-07 NOTE — Telephone Encounter (Signed)
per pof to sch pt appt-sch appt with [facility]-pt will get sch in trmt room

## 2013-09-07 NOTE — Progress Notes (Signed)
[place]  Telephone:(336) [phone] Fax:(336) [phone]     ID: [name] DOB: [birth year]  MR#: [id number]  PYK#:[id number]  Patient Care Team: Horatio Pel, MD as PCP - General (Internal Medicine) Erroll Luna, MD as Consulting Physician (General Surgery) Chauncey Cruel, MD as Consulting Physician (Oncology) Rexene Edison, MD as Consulting Physician (Radiation Oncology) Allena Katz, MD as Consulting Physician (Obstetrics and Gynecology)  CHIEF COMPLAINT: Newly diagnosed triple positive breast cancer  CURRENT TREATMENT: To start neoadjuvant chemotherapy   BREAST CANCER HISTORY: From the original intake note:  The patient herself palpated a mass in her right breast mid July. She brought it to Dr. Sherlynn Stalls attention and he set the patient up for bilateral diagnostic mammography and right breast ultrasonography of the breast Center 07/28/2013. Right mammogram showed an irregular mass in the upper outer quadrant measuring 1.4 cm. There was some associated pleomorphic microcalcifications. This was palpable, at the 10:30 position 11 cm from the nipple. By palpation it measured approximately 1.5 cm. Ultrasound confirmed an hypoechoic mass with irregular borders measuring 1.1 cm. The right axilla showed a few adjacent deep lymph nodes with mild cortical thickening.  On 08/08/2013 the patient underwent biopsy of the breast mass in question. The pathology (SAA [id number]) showed an invasive ductal carcinoma, grade 2, estrogen receptor 49% positive, progesterone receptor 47% positive, both with moderate staining intensity, with an MIB-1 of 52%, and HER-2 amplification with a signals ratio of 8.1. The right axillary lymph node biopsied at the same time was benign this was felt to be possibly concordant.  And 08/15/2013 the patient underwent bilateral breast MRI showing a 2 cm irregular enhancing mass in the upper outer quadrant of the right breast. There were no  additional changes no abnormal appearing lymph nodes and no masses or abnormalities in the left breast.  The patient's subsequent history is as detailed below  INTERVAL HISTORY: [name] returns today for followup of her breast cancer. She is here to start neoadjuvant treatment of her right breast triple positive breast cancer.  She is doing well today.  She doesn't have a lot of questions about her treatment, she is just very uncertain of what to expect and does have some expected anxiety surrounding her first treatment.  She denies fevers, chills, nausea, vomiting, constipation, diarrhea, numbness/tingling, or any further concerns.    REVIEW OF SYSTEMS: A 10 point review of systems was conducted and is otherwise negative except for what is noted above.     PAST MEDICAL HISTORY: Past Medical History  Diagnosis Date  . Anxiety   . Wears contact lenses   . Snores   . Malignant neoplasm of breast (female), unspecified site     PAST SURGICAL HISTORY: Past Surgical History  Procedure Laterality Date  . Wisdom tooth extraction    . Portacath placement N/A 08/23/2013    Procedure: INSERTION PORT-A-CATH WITH ULTRA SOUND;  Surgeon: Joyice Faster. Cornett, MD;  Location: [facility];  Service: General;  Laterality: N/A;    FAMILY HISTORY Family History  Problem Relation Age of Onset  . Uterine cancer Mother 47    TAH/BSO  . Colon cancer Father 78    deceased 29  . Leukemia Maternal Grandmother     deceased 87  . Skin cancer Paternal Grandmother   . Prostate cancer Other    the patient's father died from colon cancer the age of 86. It had been diagnosed 3 years prior. The patient's mother was diagnosed  with cancer of the uterus at age 5. She is 44 years old as of August 2015. The patient had no brothers or sisters. There is a history of prostate cancer leukemia at in the family as well as melanoma, but there is no history of breast or ovarian cancer in the family.  GYNECOLOGIC  HISTORY:  Patient's last menstrual period was 08/02/2013. Menarche age 71. The patient is GX P0. She is still having regular periods. 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 She lives alone, with no pets.    ADVANCED DIRECTIVES: Not in place. At her 08/16/2013 visit the patient was given the upper. Documents 2 complete and notarize so she may name her mother, [name] focal as her healthcare power of attorney, which is the patient's intention. Ms. focal can be reached at [phone]. She currently lives in [place] but is planning to move to [place]: History  Substance Use Topics  . Smoking status: Former Smoker    Quit date: 02/20/2013  . Smokeless tobacco: Not on file  . Alcohol Use: Yes     Comment: 1/week     Colonoscopy: Never  PAP: July 2015 Bone density: Never  Lipid panel:  Allergies  Allergen Reactions  . Amoxicillin Rash    Current Outpatient Prescriptions  Medication Sig Dispense Refill  . Calcium-Vitamin D-Vitamin K (CALCIUM SOFT CHEWS PO) Take by mouth daily.      Marland Kitchen dexamethasone (DECADRON) 4 MG tablet Take 2 tablets (8 mg total) by mouth 2 (two) times daily. Start the day before Taxotere. Then again the day after chemo for 3 days.  30 tablet  1  . lidocaine-prilocaine (EMLA) cream Apply 1 application topically as needed. Apply over port site 1-2 hours before chemo, then cove with plastic wrap  30 g  0  . LORazepam (ATIVAN) 0.5 MG tablet Take 1 tablet (0.5 mg total) by mouth at bedtime as needed (Nausea or vomiting).  30 tablet  0  . Multiple Vitamins-Minerals (MULTI ADULT GUMMIES PO) Take 2 each by mouth daily.      . ondansetron (ZOFRAN) 8 MG tablet Take 1 tablet (8 mg total) by mouth 2 (two) times daily. Start the day after chemo for 3 days. Then take as needed for nausea or vomiting.  30 tablet  1  .  oxyCODONE-acetaminophen (ROXICET) 5-325 MG per tablet Take 1 tablet by mouth every 4 (four) hours as needed.  30 tablet  0  . prochlorperazine (COMPAZINE) 10 MG tablet Take 1 tablet (10 mg total) by mouth every 6 (six) hours as needed (Nausea or vomiting).  30 tablet  1  . cholestyramine (QUESTRAN) 4 G packet Take 1 packet (4 g total) by mouth 3 (three) times daily with meals.  60 each  12  . hydrocortisone (ANUSOL-HC) 25 MG suppository Place 1 suppository (25 mg total) rectally 2 (two) times daily.  12 suppository  0  . ibuprofen (ADVIL,MOTRIN) 200 MG tablet Take 200 mg by mouth every 6 (six) hours as needed for mild pain.      Marland Kitchen loratadine (CLARITIN) 10 MG tablet Take 10 mg by mouth daily as needed for allergies.      Marland Kitchen UNABLE TO FIND Apply 1 each topically once. Med Name: Dispense per medical necessity cranial prothesis for this patient with alopecia secondary to chemotherapy due to breast cancer history.  1 each  0   No current facility-administered medications for this visit.    OBJECTIVE: Middle-aged [place] American woman who appears stated age 62 Vitals:   09/07/13 0850  BP: 130/73  Pulse: 103  Temp: 98.3 F (36.8 C)  Resp: 18     Body mass index is 52.1 kg/(m^2).    GENERAL: Patient is a well appearing female in no acute distress HEENT:  Sclerae anicteric.  Oropharynx clear and moist. No ulcerations or evidence of oropharyngeal candidiasis. Neck is supple.  NODES:  No cervical, supraclavicular, or axillary lymphadenopathy palpated.  BREAST EXAM:  Small right breast nodule palpated in the right upper outer quadrant of the breast LUNGS:  Clear to auscultation bilaterally.  No wheezes or rhonchi. HEART:  Regular rate and rhythm. No murmur appreciated. ABDOMEN:  Soft, nontender.  Positive, normoactive bowel sounds. No organomegaly palpated. MSK:  No focal spinal tenderness to palpation. Full range of motion bilaterally in the upper extremities. EXTREMITIES:  No peripheral edema.     SKIN:  Clear with no obvious rashes or skin changes. No nail dyscrasia. NEURO:  Nonfocal. Well oriented.  Appropriate affect. ECOG FS:1 - Symptomatic but completely ambulatory    LAB RESULTS:  CMP     Component Value Date/Time   NA 138 09/14/2013 0846   K 4.2 09/14/2013 0846   CO2 26 09/14/2013 0846   GLUCOSE 110 09/14/2013 0846   BUN 11.9 09/14/2013 0846   CREATININE 1.0 09/14/2013 0846   CALCIUM 8.6 09/14/2013 0846   PROT 6.6 09/14/2013 0846   ALBUMIN 3.1* 09/14/2013 0846   AST 31 09/14/2013 0846   ALT 55 09/14/2013 0846   ALKPHOS 103 09/14/2013 0846   BILITOT <0.20 09/14/2013 0846    I No results found for this basename: SPEP,  UPEP,   kappa and lambda light chains    Lab Results  Component Value Date   WBC 25.6* 09/14/2013   NEUTROABS 16.9* 09/14/2013   HGB 12.6 09/14/2013   HCT 39.4 09/14/2013   MCV 82.9 09/14/2013   PLT 212 09/14/2013      Chemistry      Component Value Date/Time   NA 138 09/14/2013 0846   K 4.2 09/14/2013 0846   CO2 26 09/14/2013 0846   BUN 11.9 09/14/2013 0846   CREATININE 1.0 09/14/2013 0846      Component Value Date/Time   CALCIUM 8.6 09/14/2013 0846   ALKPHOS 103 09/14/2013 0846   AST 31 09/14/2013 0846   ALT 55 09/14/2013 0846   BILITOT <0.20 09/14/2013 0846       No results found for this basename: LABCA2    No components found with this basename: LABCA125    No results found for this basename: INR,  in the last 168 hours  Urinalysis No results found for this basename: colorurine,  appearanceur,  labspec,  phurine,  glucoseu,  hgbur,  bilirubinur,  ketonesur,  proteinur,  urobilinogen,  nitrite,  leukocytesur    STUDIES: Mr Breast Bilateral W Wo Contrast  08/15/2013   CLINICAL DATA:  Recently diagnosed right breast invasive mammary carcinoma on ultrasound-guided core biopsy. Preoperative evaluation.  EXAM: BILATERAL BREAST MRI WITH AND WITHOUT CONTRAST  TECHNIQUE: Multiplanar, multisequence MR images of both breasts were obtained prior to and following the intravenous  administration of 50m of MultiHance  THREE-DIMENSIONAL MR IMAGE RENDERING ON INDEPENDENT WORKSTATION:  Three-dimensional MR images were rendered by post-processing of the original MR data on an independent workstation. The three-dimensional MR images were interpreted, and  findings are reported in the following complete MRI report for this study. Three dimensional images were evaluated at the independent DynaCad workstation  COMPARISON:  08/08/2013 and 07/28/2013 mammograms as well as 07/28/2013 right breast ultrasound.  FINDINGS: Breast composition: b.  Scattered fibroglandular tissue.  Background parenchymal enhancement: Mild  Right breast: There is a 2.0 x 1.4 x 1.4 cm irregular enhancing mass with central clip artifact located within the upper outer quadrant of the right breast (middle 1/3). This is associated with a mixture of wash in / washout and plateau enhancement kinetics. This corresponds to the known invasive mammary carcinoma. There is mild adjacent post biopsy change. There are no additional and worrisome enhancing foci within the right breast.  Left breast: No mass or abnormal enhancement  Lymph nodes: No abnormal appearing lymph nodes.  Ancillary findings:  None.  IMPRESSION: 2.0 cm irregular enhancing mass with central clip artifact located within the upper outer quadrant of the right breast corresponding to the recently diagnosed invasive mammary carcinoma. No additional findings.  RECOMMENDATION: Treatment plan.  BI-RADS CATEGORY  6: Known biopsy-proven malignancy.   Electronically Signed   By: Luberta Robertson M.D.   On: 08/15/2013 10:09    ASSESSMENT: 43 y.o. [place] woman status post right breast upper outer quadrant biopsy 08/08/2013 for a triple positive invasive ductal carcinoma, grade 2, with an MIB-1 of 52%, and a suspicious lymph node biopsied at the same time pathologically benign.  (1) genetics testing pending  (2) neoadjuvant chemotherapy to consist of carboplatin, docetaxel,  trastuzumab and pertuzumab for 6 cycles, starting 09/07/2013  (3) Echocardiogram on 08/25/13 demonstrated a LVEF of 60-65%.    PLAN:  [name] is doing well today.  She is going to begin treatment today with Docetaxel, Carboplatin, Trastuzumab and Pertuzumab.  She will receive this treatment on day 1 of a 21 day cycle with neulasta given on day 2 for granulocyte support.  I reviewed her lab work with her in detail today, along with what we look for in deciding to treat patients with chemotherapy, and the basics about the lab tests that I reviewed today.  I reviewed the chemotherapies and targeted therapies with her briefly, and she does not have any questions about how to take her anti-emetics.  We reviewed her Neulasta in detail and I recommended she take claritin daily starting tomorrow to help off set the bone pain.  I encouraged her to call us if she develops diarrhea, and or dehydration and we can see her sooner than her next appointment and even add her on for IV fluids.  I referred her to Dr. Aundra Dubin for evaluation and to follow her since she is on Trastuzumab therapy and will be for the next year.    [name] will return in one week for labs and evaluation of chemotoxicities. After 3 cycles she will have a repeat breast MRI.  The patient has a good understanding of the overall plan. She agrees with it. She knows the goal of treatment in her case is cure. She will call with any problems that may develop before her next visit here.  I spent 40 minutes counseling the patient face to face.  The total time spent in the appointment was 50 minutes.  [name], [place] [phone]  09/14/2013 1:02 PM

## 2013-09-07 NOTE — Patient Instructions (Signed)
[facility] Discharge Instructions for Patients Receiving Chemotherapy  Today you received the following chemotherapy agents Taxotere, Carboplatin, Herceptin and Perjeta.  To help prevent nausea and vomiting after your treatment, we encourage you to take your nausea medication.   If you develop nausea and vomiting that is not controlled by your nausea medication, call the clinic.   BELOW ARE SYMPTOMS THAT SHOULD BE REPORTED IMMEDIATELY:  *FEVER GREATER THAN 100.5 F  *CHILLS WITH OR WITHOUT FEVER  NAUSEA AND VOMITING THAT IS NOT CONTROLLED WITH YOUR NAUSEA MEDICATION  *UNUSUAL SHORTNESS OF BREATH  *UNUSUAL BRUISING OR BLEEDING  TENDERNESS IN MOUTH AND THROAT WITH OR WITHOUT PRESENCE OF ULCERS  *URINARY PROBLEMS  *BOWEL PROBLEMS  UNUSUAL RASH Items with * indicate a potential emergency and should be followed up as soon as possible.  Feel free to call the clinic you have any questions or concerns. The clinic phone number is [phone].    

## 2013-09-07 NOTE — Telephone Encounter (Signed)
Per staff message and POF I have scheduled appts.  

## 2013-09-08 ENCOUNTER — Ambulatory Visit (HOSPITAL_BASED_OUTPATIENT_CLINIC_OR_DEPARTMENT_OTHER): Payer: 59

## 2013-09-08 ENCOUNTER — Telehealth: Payer: Self-pay | Admitting: *Deleted

## 2013-09-08 VITALS — BP 140/78 | HR 98 | Temp 97.5°F

## 2013-09-08 DIAGNOSIS — C50919 Malignant neoplasm of unspecified site of unspecified female breast: Secondary | ICD-10-CM

## 2013-09-08 DIAGNOSIS — C50411 Malignant neoplasm of upper-outer quadrant of right female breast: Secondary | ICD-10-CM

## 2013-09-08 DIAGNOSIS — Z5189 Encounter for other specified aftercare: Secondary | ICD-10-CM

## 2013-09-08 DIAGNOSIS — C50419 Malignant neoplasm of upper-outer quadrant of unspecified female breast: Secondary | ICD-10-CM

## 2013-09-08 MED ORDER — PEGFILGRASTIM INJECTION 6 MG/0.6ML
6.0000 mg | Freq: Once | SUBCUTANEOUS | Status: AC
  Administered 2013-09-08: 6 mg via SUBCUTANEOUS
  Filled 2013-09-08: qty 0.6

## 2013-09-08 NOTE — Telephone Encounter (Signed)
Called pt at home for post chemo follow up.  Stated slept well last night;  Denied nausea/vomiting;  Good appetite and drinking lots of fluids as tolerated.  Bowel and bladder function fine.  Denied pain.  Pt aware of next returned appt.

## 2013-09-08 NOTE — Patient Instructions (Signed)
Pegfilgrastim injection What is this medicine? PEGFILGRASTIM (peg fil GRA stim) is a long-acting granulocyte colony-stimulating factor that stimulates the growth of neutrophils, a type of white blood cell important in the body's fight against infection. It is used to reduce the incidence of fever and infection in patients with certain types of cancer who are receiving chemotherapy that affects the bone marrow. This medicine may be used for other purposes; ask your health care provider or pharmacist if you have questions. COMMON BRAND NAME(S): Neulasta What should I tell my health care provider before I take this medicine? They need to know if you have any of these conditions: -latex allergy -ongoing radiation therapy -sickle cell disease -skin reactions to acrylic adhesives (On-Body Injector only) -an unusual or allergic reaction to pegfilgrastim, filgrastim, other medicines, foods, dyes, or preservatives -pregnant or trying to get pregnant -breast-feeding How should I use this medicine? This medicine is for injection under the skin. If you get this medicine at home, you will be taught how to prepare and give the pre-filled syringe or how to use the On-body Injector. Refer to the patient Instructions for Use for detailed instructions. Use exactly as directed. Take your medicine at regular intervals. Do not take your medicine more often than directed. It is important that you put your used needles and syringes in a special sharps container. Do not put them in a trash can. If you do not have a sharps container, call your pharmacist or healthcare provider to get one. Talk to your pediatrician regarding the use of this medicine in children. Special care may be needed. Overdosage: If you think you have taken too much of this medicine contact a poison control center or emergency room at once. NOTE: This medicine is only for you. Do not share this medicine with others. What if I miss a dose? It is  important not to miss your dose. Call your doctor or health care professional if you miss your dose. If you miss a dose due to an On-body Injector failure or leakage, a new dose should be administered as soon as possible using a single prefilled syringe for manual use. What may interact with this medicine? Interactions have not been studied. Give your health care provider a list of all the medicines, herbs, non-prescription drugs, or dietary supplements you use. Also tell them if you smoke, drink alcohol, or use illegal drugs. Some items may interact with your medicine. This list may not describe all possible interactions. Give your health care provider a list of all the medicines, herbs, non-prescription drugs, or dietary supplements you use. Also tell them if you smoke, drink alcohol, or use illegal drugs. Some items may interact with your medicine. What should I watch for while using this medicine? You may need blood work done while you are taking this medicine. If you are going to need a MRI, CT scan, or other procedure, tell your doctor that you are using this medicine (On-Body Injector only). What side effects may I notice from receiving this medicine? Side effects that you should report to your doctor or health care professional as soon as possible: -allergic reactions like skin rash, itching or hives, swelling of the face, lips, or tongue -dizziness -fever -pain, redness, or irritation at site where injected -pinpoint red spots on the skin -shortness of breath or breathing problems -stomach or side pain, or pain at the shoulder -swelling -tiredness -trouble passing urine Side effects that usually do not require medical attention (report to your doctor   or health care professional if they continue or are bothersome): -bone pain -muscle pain This list may not describe all possible side effects. Call your doctor for medical advice about side effects. You may report side effects to FDA at  1-800-FDA-1088. Where should I keep my medicine? Keep out of the reach of children. Store pre-filled syringes in a refrigerator between 2 and 8 degrees C (36 and 46 degrees F). Do not freeze. Keep in carton to protect from light. Throw away this medicine if it is left out of the refrigerator for more than 48 hours. Throw away any unused medicine after the expiration date. NOTE: This sheet is a summary. It may not cover all possible information. If you have questions about this medicine, talk to your doctor, pharmacist, or health care provider.  2015, Elsevier/Gold Standard. (2013-03-30 16:14:05)  

## 2013-09-12 ENCOUNTER — Encounter: Payer: Self-pay | Admitting: Genetic Counselor

## 2013-09-12 NOTE — Progress Notes (Signed)
GENETIC TEST RESULTS  Referring Physician: Lurline Del, MD   [name] was called today to discuss genetic test results. Please see the Genetics note from her visit on 08/25/13 for a detailed discussion of her personal and family history.  GENETIC TESTING: At the time of [name]'s visit, we recommended she pursue genetic testing of multiple genes on the BreastNext gene panel. This test, which included sequencing and deletion/duplication analysis of 17 genes, was performed at [facility]. Testing was normal and did not reveal a mutation in these genes. The genes tested were ATM, BARD1, BRCA1, BRCA2, BRIP1, CDH1, CHEK2, MRE11A, MUTYH, NBN, NF1, PALB2, PTEN, RAD50, RAD51C, RAD51D, and TP53.  We discussed with [name] that since the current test is not perfect, it is possible there may be a gene mutation that current testing cannot detect, but that chance is small. We also discussed that it is possible that a different genetic factor, which was not part of this testing or has not yet been discovered, is responsible for the cancer diagnoses in the family. Given her family history, this chance is also small.  CANCER SCREENING: This result suggests that [name]'s cancer was most likely not due to an inherited predisposition. Most cancers happen by chance and this negative test, along with details of her family history, suggests that her cancer falls into this category. We, therefore, recommended she continue to follow the cancer screening guidelines provided by her physician.   FAMILY MEMBERS: Women in the family are at some increased risk of developing breast cancer, over the general population risk, simply due to the family history. We recommended they have a yearly mammogram beginning, a yearly clinical breast exam, and perform monthly breast self-exams. A gynecologic exam is recommended yearly. Colon cancer screening is recommended to begin by age 57.   Lastly, we  discussed with [name] that cancer genetics is a rapidly advancing field and it is possible that new genetic tests will be appropriate for her in the future. We encouraged her to remain in contact with [place] on an annual basis so we can update her personal and family histories, and let her know of advances in cancer genetics that may benefit the family. Our contact number was provided. [name] questions were answered to her satisfaction today, and she knows she is welcome to call anytime with additional questions.    Linda Berg, [name], [place] Certified Genetic Counseor phone: [phone] [name]

## 2013-09-13 ENCOUNTER — Other Ambulatory Visit: Payer: Self-pay | Admitting: Emergency Medicine

## 2013-09-13 DIAGNOSIS — C50411 Malignant neoplasm of upper-outer quadrant of right female breast: Secondary | ICD-10-CM

## 2013-09-14 ENCOUNTER — Telehealth: Payer: Self-pay | Admitting: *Deleted

## 2013-09-14 ENCOUNTER — Encounter: Payer: Self-pay | Admitting: Nurse Practitioner

## 2013-09-14 ENCOUNTER — Other Ambulatory Visit (HOSPITAL_BASED_OUTPATIENT_CLINIC_OR_DEPARTMENT_OTHER): Payer: 59

## 2013-09-14 ENCOUNTER — Ambulatory Visit (HOSPITAL_BASED_OUTPATIENT_CLINIC_OR_DEPARTMENT_OTHER): Payer: 59 | Admitting: Nurse Practitioner

## 2013-09-14 ENCOUNTER — Other Ambulatory Visit: Payer: Self-pay | Admitting: *Deleted

## 2013-09-14 ENCOUNTER — Ambulatory Visit: Payer: 59

## 2013-09-14 VITALS — BP 106/62 | HR 105 | Temp 98.2°F | Resp 18 | Ht 65.0 in | Wt 306.3 lb

## 2013-09-14 DIAGNOSIS — C50919 Malignant neoplasm of unspecified site of unspecified female breast: Secondary | ICD-10-CM

## 2013-09-14 DIAGNOSIS — K649 Unspecified hemorrhoids: Secondary | ICD-10-CM | POA: Insufficient documentation

## 2013-09-14 DIAGNOSIS — L989 Disorder of the skin and subcutaneous tissue, unspecified: Secondary | ICD-10-CM | POA: Insufficient documentation

## 2013-09-14 DIAGNOSIS — R51 Headache: Secondary | ICD-10-CM

## 2013-09-14 DIAGNOSIS — C50411 Malignant neoplasm of upper-outer quadrant of right female breast: Secondary | ICD-10-CM

## 2013-09-14 DIAGNOSIS — R197 Diarrhea, unspecified: Secondary | ICD-10-CM | POA: Insufficient documentation

## 2013-09-14 DIAGNOSIS — Z23 Encounter for immunization: Secondary | ICD-10-CM

## 2013-09-14 DIAGNOSIS — C50419 Malignant neoplasm of upper-outer quadrant of unspecified female breast: Secondary | ICD-10-CM

## 2013-09-14 DIAGNOSIS — R109 Unspecified abdominal pain: Secondary | ICD-10-CM

## 2013-09-14 LAB — CBC WITH DIFFERENTIAL/PLATELET
BASO%: 0.8 % (ref 0.0–2.0)
BASOS ABS: 0.2 10*3/uL — AB (ref 0.0–0.1)
EOS%: 0.2 % (ref 0.0–7.0)
Eosinophils Absolute: 0.1 10*3/uL (ref 0.0–0.5)
HCT: 39.4 % (ref 34.8–46.6)
HEMOGLOBIN: 12.6 g/dL (ref 11.6–15.9)
LYMPH#: 5.7 10*3/uL — AB (ref 0.9–3.3)
LYMPH%: 22.3 % (ref 14.0–49.7)
MCH: 26.5 pg (ref 25.1–34.0)
MCHC: 32 g/dL (ref 31.5–36.0)
MCV: 82.9 fL (ref 79.5–101.0)
MONO#: 2.7 10*3/uL — ABNORMAL HIGH (ref 0.1–0.9)
MONO%: 10.7 % (ref 0.0–14.0)
NEUT#: 16.9 10*3/uL — ABNORMAL HIGH (ref 1.5–6.5)
NEUT%: 66 % (ref 38.4–76.8)
Platelets: 212 10*3/uL (ref 145–400)
RBC: 4.75 10*6/uL (ref 3.70–5.45)
RDW: 15.8 % — ABNORMAL HIGH (ref 11.2–14.5)
WBC: 25.6 10*3/uL — ABNORMAL HIGH (ref 3.9–10.3)

## 2013-09-14 LAB — COMPREHENSIVE METABOLIC PANEL (CC13)
ALBUMIN: 3.1 g/dL — AB (ref 3.5–5.0)
ALT: 55 U/L (ref 0–55)
AST: 31 U/L (ref 5–34)
Alkaline Phosphatase: 103 U/L (ref 40–150)
Anion Gap: 9 mEq/L (ref 3–11)
BUN: 11.9 mg/dL (ref 7.0–26.0)
CALCIUM: 8.6 mg/dL (ref 8.4–10.4)
CHLORIDE: 103 meq/L (ref 98–109)
CO2: 26 mEq/L (ref 22–29)
Creatinine: 1 mg/dL (ref 0.6–1.1)
Glucose: 110 mg/dl (ref 70–140)
POTASSIUM: 4.2 meq/L (ref 3.5–5.1)
Sodium: 138 mEq/L (ref 136–145)
Total Bilirubin: <0.2 mg/dL (ref 0.20–1.20)
Total Protein: 6.6 g/dL (ref 6.4–8.3)

## 2013-09-14 MED ORDER — CHOLESTYRAMINE 4 G PO PACK: 4.0000 g | PACK | Freq: Three times a day (TID) | ORAL | Status: DC

## 2013-09-14 MED ORDER — INFLUENZA VAC SPLIT QUAD 0.5 ML IM SUSY
0.5000 mL | PREFILLED_SYRINGE | Freq: Once | INTRAMUSCULAR | Status: AC
  Administered 2013-09-14: 0.5 mL via INTRAMUSCULAR
  Filled 2013-09-14: qty 0.5

## 2013-09-14 MED ORDER — HYDROCORTISONE ACETATE 25 MG RE SUPP: 25.0000 mg | Freq: Two times a day (BID) | RECTAL | Status: DC

## 2013-09-14 NOTE — Telephone Encounter (Signed)
Per staff message and POF I have scheduled appts. Advised scheduler of appts. JMW  

## 2013-09-14 NOTE — Progress Notes (Signed)
Pt complains of diarrhea, abdominal pain, mouth bleeding, scabs to scalp, and face hurting.  States that rear end hurts when pooping. States pain is about a 4/10.

## 2013-09-14 NOTE — Patient Instructions (Signed)

## 2013-09-14 NOTE — Telephone Encounter (Signed)
Message left on pt voicemail that prescription had been called in to [facility] and that she could pick up her prescriptions at her earliest convenience.

## 2013-09-14 NOTE — Progress Notes (Signed)
[place]  Telephone:(336) [phone] Fax:(336) [phone]     ID: [name] DOB: November 30, 1970  MR#: [id number]  GHW#:[id number]  Patient Care Team: Horatio Pel, MD as PCP - General (Internal Medicine) Erroll Luna, MD as Consulting Physician (General Surgery) Chauncey Cruel, MD as Consulting Physician (Oncology) Allena Katz, MD as Consulting Physician (Obstetrics and Gynecology)  CHIEF COMPLAINT: Newly diagnosed triple positive breast cancer  CURRENT TREATMENT: neoadjuvant chemotherapy   BREAST CANCER HISTORY: From the original intake note:  The patient herself palpated a mass in her right breast mid July. She brought it to Dr. Sherlynn Stalls attention and he set the patient up for bilateral diagnostic mammography and right breast ultrasonography of the breast Center 07/28/2013. Right mammogram showed an irregular mass in the upper outer quadrant measuring 1.4 cm. There was some associated pleomorphic microcalcifications. This was palpable, at the 10:30 position 11 cm from the nipple. By palpation it measured approximately 1.5 cm. Ultrasound confirmed an hypoechoic mass with irregular borders measuring 1.1 cm. The right axilla showed a few adjacent deep lymph nodes with mild cortical thickening.  On 08/08/2013 the patient underwent biopsy of the breast mass in question. The pathology (SAA [id number]) showed an invasive ductal carcinoma, grade 2, estrogen receptor 49% positive, progesterone receptor 47% positive, both with moderate staining intensity, with an MIB-1 of 52%, and HER-2 amplification with a signals ratio of 8.1. The right axillary lymph node biopsied at the same time was benign this was felt to be possibly concordant.  And 08/15/2013 the patient underwent bilateral breast MRI showing a 2 cm irregular enhancing mass in the upper outer quadrant of the right breast. There were no additional changes no abnormal appearing lymph nodes and no masses or  abnormalities in the left breast.  The patient's subsequent history is as detailed below  INTERVAL HISTORY: [name] returns today with her mother for follow up of her breast cancer. Today is day 8, cycle 1 of taxotere, carboplatin, herceptin and perjeta with neulasta on day 2 for granulocyte support. [name] has list of complaints including diarrhea, hemorrhoids, cramping, pain around her mouth, and painful skin irritation to her face and scalp. She has been taking imodium 2-3 times per day and she is now down to 5 loose bowel movements yesterday versus around 8-10 on Monday. Her hemorroids are external and bleed on occasion when wiping. She has been using biotene mouthwash to prevent mouth sores. She is only mildly nauseous but the home anti-emetic "road map" has been helpful. She denies fevers, but endorses occasional chills.  REVIEW OF SYSTEMS: A detailed review of systems is otherwise noncontributory except as noted above.   PAST MEDICAL HISTORY: Past Medical History  Diagnosis Date  . Anxiety   . Wears contact lenses   . Snores   . Malignant neoplasm of breast (female), unspecified site     PAST SURGICAL HISTORY: Past Surgical History  Procedure Laterality Date  . Wisdom tooth extraction    . Portacath placement N/A 08/23/2013    Procedure: INSERTION PORT-A-CATH WITH ULTRA SOUND;  Surgeon: Joyice Faster. Cornett, MD;  Location: [facility];  Service: General;  Laterality: N/A;    FAMILY HISTORY Family History  Problem Relation Age of Onset  . Uterine cancer Mother 32    TAH/BSO  . Colon cancer Father 69    deceased 71  . Leukemia Maternal Grandmother     deceased 71  . Skin cancer Paternal Grandmother   . Prostate cancer Other  the patient's father died from colon cancer the age of 26. It had been diagnosed 3 years prior. The patient's mother was diagnosed with cancer of the uterus at age 64. She is 44 years old as of August 2015. The patient had no brothers or  sisters. There is a history of prostate cancer leukemia at in the family as well as melanoma, but there is no history of breast or ovarian cancer in the family.  GYNECOLOGIC HISTORY:  Patient's last menstrual period was 08/02/2013. Menarche age 70. The patient is GX P0. She is still having regular periods. 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 She lives alone, with no pets.    ADVANCED DIRECTIVES: Not in place. At her 08/16/2013 visit the patient was given the upper. Documents 2 complete and notarize so she may name her mother, [name] focal as her healthcare power of attorney, which is the patient's intention. Ms. focal can be reached at [phone]. She currently lives in [place] but is planning to move to [place]: History  Substance Use Topics  . Smoking status: Former Smoker    Quit date: 02/20/2013  . Smokeless tobacco: Not on file  . Alcohol Use: Yes     Comment: 1/week     Colonoscopy: Never  PAP: July 2015 Bone density: Never  Lipid panel:  Allergies  Allergen Reactions  . Amoxicillin Rash    Current Outpatient Prescriptions  Medication Sig Dispense Refill  . Calcium-Vitamin D-Vitamin K (CALCIUM SOFT CHEWS PO) Take by mouth daily.      Marland Kitchen dexamethasone (DECADRON) 4 MG tablet Take 2 tablets (8 mg total) by mouth 2 (two) times daily. Start the day before Taxotere. Then again the day after chemo for 3 days.  30 tablet  1  . ibuprofen (ADVIL,MOTRIN) 200 MG tablet Take 200 mg by mouth every 6 (six) hours as needed for mild pain.      Marland Kitchen lidocaine-prilocaine (EMLA) cream Apply 1 application topically as needed. Apply over port site 1-2 hours before chemo, then cove with plastic wrap  30 g  0  . loratadine (CLARITIN) 10 MG tablet Take 10 mg by mouth daily as needed for allergies.      Marland Kitchen LORazepam (ATIVAN) 0.5 MG tablet Take 1  tablet (0.5 mg total) by mouth at bedtime as needed (Nausea or vomiting).  30 tablet  0  . Multiple Vitamins-Minerals (MULTI ADULT GUMMIES PO) Take 2 each by mouth daily.      . ondansetron (ZOFRAN) 8 MG tablet Take 1 tablet (8 mg total) by mouth 2 (two) times daily. Start the day after chemo for 3 days. Then take as needed for nausea or vomiting.  30 tablet  1  . oxyCODONE-acetaminophen (ROXICET) 5-325 MG per tablet Take 1 tablet by mouth every 4 (four) hours as needed.  30 tablet  0  . prochlorperazine (COMPAZINE) 10 MG tablet Take 1 tablet (10 mg total) by mouth every 6 (six) hours as needed (Nausea or vomiting).  30 tablet  1  . UNABLE TO FIND Apply 1 each topically once. Med Name: Dispense per medical necessity cranial prothesis for this patient with alopecia secondary to chemotherapy due to breast cancer history.  1 each  0  . cholestyramine (QUESTRAN) 4 G packet Take 1 packet (4 g total) by mouth 3 (three) times daily with meals.  60 each  12  . hydrocortisone (ANUSOL-HC) 25 MG suppository Place 1 suppository (25 mg total) rectally 2 (two) times daily.  12 suppository  0   No current facility-administered medications for this visit.    OBJECTIVE: Middle-aged [place] American woman who appears stated age 43 Vitals:   09/14/13 0903  BP: 106/62  Pulse: 105  Temp: 98.2 F (36.8 C)  Resp: 18     Body mass index is 50.97 kg/(m^2).     ECOG FS:1 - Symptomatic but completely ambulatory Skin: warm, dry, cracking peeling around lips, diffuse acne on t-zone of face  HEENT: sclerae anicteric, conjunctivae pink, oropharynx clear. No thrush or mucositis.  Lymph Nodes: No cervical or supraclavicular lymphadenopathy  Lungs: clear to auscultation bilaterally, no rales, wheezes, or rhonci  Heart: regular rate and rhythm  Abdomen: round, soft, non tender, positive bowel sounds  Musculoskeletal: No focal spinal tenderness, no peripheral edema  Neuro: non focal, well oriented, positive affect    Breastd: deferred   LAB RESULTS:  CMP     Component Value Date/Time   NA 138 09/14/2013 0846   K 4.2 09/14/2013 0846   CO2 26 09/14/2013 0846   GLUCOSE 110 09/14/2013 0846   BUN 11.9 09/14/2013 0846   CREATININE 1.0 09/14/2013 0846   CALCIUM 8.6 09/14/2013 0846   PROT 6.6 09/14/2013 0846   ALBUMIN 3.1* 09/14/2013 0846   AST 31 09/14/2013 0846   ALT 55 09/14/2013 0846   ALKPHOS 103 09/14/2013 0846   BILITOT <0.20 09/14/2013 0846    I No results found for this basename: SPEP,  UPEP,   kappa and lambda light chains    Lab Results  Component Value Date   WBC 25.6* 09/14/2013   NEUTROABS 16.9* 09/14/2013   HGB 12.6 09/14/2013   HCT 39.4 09/14/2013   MCV 82.9 09/14/2013   PLT 212 09/14/2013      Chemistry      Component Value Date/Time   NA 138 09/14/2013 0846   K 4.2 09/14/2013 0846   CO2 26 09/14/2013 0846   BUN 11.9 09/14/2013 0846   CREATININE 1.0 09/14/2013 0846      Component Value Date/Time   CALCIUM 8.6 09/14/2013 0846   ALKPHOS 103 09/14/2013 0846   AST 31 09/14/2013 0846   ALT 55 09/14/2013 0846   BILITOT <0.20 09/14/2013 0846       No results found for this basename: LABCA2    No components found with this basename: LABCA125    No results found for this basename: INR,  in the last 168 hours  Urinalysis No results found for this basename: colorurine,  appearanceur,  labspec,  phurine,  glucoseu,  hgbur,  bilirubinur,  ketonesur,  proteinur,  urobilinogen,  nitrite,  leukocytesur    STUDIES: Chest 2 View  08/21/2013   CLINICAL DATA:  Newly diagnosed breast carcinoma. Pre-op respiratory exam  EXAM: CHEST  2 VIEW  COMPARISON:  None.  FINDINGS: The heart size and mediastinal contours are within normal limits. Both lungs are clear. The visualized skeletal structures are unremarkable.  IMPRESSION: No active cardiopulmonary disease.   Electronically Signed   By: Earle Gell M.D.   On: 08/21/2013 15:42   Dg Chest Port 1 View  08/23/2013   ADDENDUM REPORT: 08/23/2013 13:52  ADDENDUM: Speech recognition  error in the impression. Second line should read "Poor inspiration".   Electronically Signed   By: Nelson Chimes M.D.   On: 08/23/2013 13:52   08/23/2013   CLINICAL  DATA:  Port-A-Cath insertion.  EXAM: PORTABLE CHEST - 1 VIEW  COMPARISON:  08/21/2013  FINDINGS: Port-A-Cath this placed on the right. The tip is at the SVC RA junction. No pneumothorax. The patient has taken a poor inspiration. Allowing for this, the lungs are probably clear.  IMPRESSION: Port-A-Cath tip at the SVC RA junction. No apparent complication. Points for a shin.  Electronically Signed: By: Nelson Chimes M.D. On: 08/23/2013 13:27   Dg Fluoro Guide Cv Line-no Report  08/23/2013   CLINICAL DATA: portacath placement   FLOURO GUIDE CV LINE  Fluoroscopy was utilized by the requesting physician.  No radiographic  interpretation.     ASSESSMENT: 43 y.o. [place] woman status post right breast upper outer quadrant biopsy 08/08/2013 for a triple positive invasive ductal carcinoma, grade 2, with an MIB-1 of 52%, and a suspicious lymph node biopsied at the same time pathologically benign.  (1) genetics testing pending  (2) neoadjuvant chemotherapy to consist of carboplatin, docetaxel, trastuzumab and pertuzumab for 6 cycles, starting 09/07/2013, pertuzumab removed after heavy diarrhea after cycle 1  (3) Echocardiogram on 08/25/13 demonstrated a LVEF of 60-65%.    PLAN: [name] is not doing well today. The labs were reviewed and were stable. She is anxious for the diarrhea to stop, and would like her skin sensitivity resolved. She believes she is staying hydrated and I advised her to drink at least 64oz of water per day. I wrote her a prescription for Questran and instructed her on its use and she will continue the imodium use PRN. I also sent in anusol-HC suppositories to be used BID for the next week to help with the hemorrhoids. She is currently using tucks wipes for the irritation, and this is fine to continue as well. The perjeta is  likely the cause of this diarrhea and Dr. Jana Hakim would like to remove this from her regimen.   I am unsure of the facial tightness and pain. I advised her to use a gentle fragrance free moisturizer such as cetaphil or eucerin on this area and to keep use a soft balm on her lips. If it is at all related to the perjeta it should clear with time now that we will stop using it.   [name] will return for labs, an office visit, and the start of cycle 2 on 9/17. We will repeat an MRI after 3 cycles. She understands and agrees with the plan. She has been encouraged to call with any issues that might arise before her next visit here, especially if she become dehydrated, in which case we will order IV fluids.   Marcelino Duster, NP 09/14/2013 5:00 PM

## 2013-09-15 ENCOUNTER — Telehealth: Payer: Self-pay | Admitting: *Deleted

## 2013-09-15 ENCOUNTER — Ambulatory Visit: Payer: 59

## 2013-09-15 ENCOUNTER — Telehealth: Payer: Self-pay | Admitting: Nurse Practitioner

## 2013-09-15 NOTE — Telephone Encounter (Signed)
per pof to sch pt aapt-MW sch trmt-sent MW email to move trmt from 9/24 to 10/8-pt has MY CHART & will review appt time

## 2013-09-15 NOTE — Telephone Encounter (Signed)
Per staff message from scheduler I have moved appt from 9/24 to 10/8

## 2013-09-25 ENCOUNTER — Other Ambulatory Visit: Payer: Self-pay | Admitting: *Deleted

## 2013-09-25 DIAGNOSIS — K649 Unspecified hemorrhoids: Secondary | ICD-10-CM

## 2013-09-25 DIAGNOSIS — R197 Diarrhea, unspecified: Secondary | ICD-10-CM

## 2013-09-25 MED ORDER — HYDROCORTISONE ACETATE 25 MG RE SUPP: 25.0000 mg | Freq: Two times a day (BID) | RECTAL | Status: DC

## 2013-09-25 MED ORDER — CHOLESTYRAMINE 4 G PO PACK: 4.0000 g | PACK | Freq: Three times a day (TID) | ORAL | Status: DC

## 2013-09-25 NOTE — Telephone Encounter (Signed)
Received call from patient that she did not have rxs. [name] and Anusol sent to [facility] outpatient pharmacy.

## 2013-09-27 ENCOUNTER — Other Ambulatory Visit: Payer: Self-pay | Admitting: *Deleted

## 2013-09-27 DIAGNOSIS — C50411 Malignant neoplasm of upper-outer quadrant of right female breast: Secondary | ICD-10-CM

## 2013-09-28 ENCOUNTER — Ambulatory Visit (HOSPITAL_BASED_OUTPATIENT_CLINIC_OR_DEPARTMENT_OTHER): Payer: 59

## 2013-09-28 ENCOUNTER — Other Ambulatory Visit: Payer: Self-pay | Admitting: Oncology

## 2013-09-28 ENCOUNTER — Other Ambulatory Visit (HOSPITAL_BASED_OUTPATIENT_CLINIC_OR_DEPARTMENT_OTHER): Payer: 59

## 2013-09-28 ENCOUNTER — Ambulatory Visit (HOSPITAL_BASED_OUTPATIENT_CLINIC_OR_DEPARTMENT_OTHER): Payer: 59 | Admitting: Nurse Practitioner

## 2013-09-28 ENCOUNTER — Encounter: Payer: Self-pay | Admitting: Nurse Practitioner

## 2013-09-28 VITALS — BP 145/65 | HR 92 | Temp 97.5°F | Resp 18

## 2013-09-28 VITALS — BP 125/65 | HR 91 | Temp 97.8°F | Resp 20 | Ht 65.0 in | Wt 314.7 lb

## 2013-09-28 DIAGNOSIS — R7309 Other abnormal glucose: Secondary | ICD-10-CM

## 2013-09-28 DIAGNOSIS — C50411 Malignant neoplasm of upper-outer quadrant of right female breast: Secondary | ICD-10-CM

## 2013-09-28 DIAGNOSIS — Z5112 Encounter for antineoplastic immunotherapy: Secondary | ICD-10-CM

## 2013-09-28 DIAGNOSIS — C50419 Malignant neoplasm of upper-outer quadrant of unspecified female breast: Secondary | ICD-10-CM

## 2013-09-28 DIAGNOSIS — L989 Disorder of the skin and subcutaneous tissue, unspecified: Secondary | ICD-10-CM

## 2013-09-28 DIAGNOSIS — Z5111 Encounter for antineoplastic chemotherapy: Secondary | ICD-10-CM

## 2013-09-28 DIAGNOSIS — C50919 Malignant neoplasm of unspecified site of unspecified female breast: Secondary | ICD-10-CM

## 2013-09-28 DIAGNOSIS — L608 Other nail disorders: Secondary | ICD-10-CM

## 2013-09-28 DIAGNOSIS — T380X5A Adverse effect of glucocorticoids and synthetic analogues, initial encounter: Secondary | ICD-10-CM | POA: Insufficient documentation

## 2013-09-28 DIAGNOSIS — Z170 Estrogen receptor positive status [ER+]: Secondary | ICD-10-CM

## 2013-09-28 DIAGNOSIS — R739 Hyperglycemia, unspecified: Secondary | ICD-10-CM | POA: Insufficient documentation

## 2013-09-28 LAB — CBC WITH DIFFERENTIAL/PLATELET
BASO%: 0.9 % (ref 0.0–2.0)
Basophils Absolute: 0.1 10*3/uL (ref 0.0–0.1)
EOS ABS: 0 10*3/uL (ref 0.0–0.5)
EOS%: 0 % (ref 0.0–7.0)
HEMATOCRIT: 35.5 % (ref 34.8–46.6)
HGB: 11.2 g/dL — ABNORMAL LOW (ref 11.6–15.9)
LYMPH%: 12.7 % — ABNORMAL LOW (ref 14.0–49.7)
MCH: 26.6 pg (ref 25.1–34.0)
MCHC: 31.7 g/dL (ref 31.5–36.0)
MCV: 83.9 fL (ref 79.5–101.0)
MONO#: 0.2 10*3/uL (ref 0.1–0.9)
MONO%: 1.5 % (ref 0.0–14.0)
NEUT%: 84.9 % — AB (ref 38.4–76.8)
NEUTROS ABS: 9.4 10*3/uL — AB (ref 1.5–6.5)
Platelets: 413 10*3/uL — ABNORMAL HIGH (ref 145–400)
RBC: 4.23 10*6/uL (ref 3.70–5.45)
RDW: 16.3 % — ABNORMAL HIGH (ref 11.2–14.5)
WBC: 11.1 10*3/uL — AB (ref 3.9–10.3)
lymph#: 1.4 10*3/uL (ref 0.9–3.3)

## 2013-09-28 LAB — COMPREHENSIVE METABOLIC PANEL (CC13)
ALT: 21 U/L (ref 0–55)
ANION GAP: 12 meq/L — AB (ref 3–11)
AST: 15 U/L (ref 5–34)
Albumin: 3.2 g/dL — ABNORMAL LOW (ref 3.5–5.0)
Alkaline Phosphatase: 106 U/L (ref 40–150)
BUN: 8.8 mg/dL (ref 7.0–26.0)
CALCIUM: 9.3 mg/dL (ref 8.4–10.4)
CHLORIDE: 108 meq/L (ref 98–109)
CO2: 19 meq/L — AB (ref 22–29)
CREATININE: 0.9 mg/dL (ref 0.6–1.1)
Glucose: 174 mg/dl — ABNORMAL HIGH (ref 70–140)
Potassium: 4.3 mEq/L (ref 3.5–5.1)
Sodium: 139 mEq/L (ref 136–145)
TOTAL PROTEIN: 7.4 g/dL (ref 6.4–8.3)
Total Bilirubin: 0.2 mg/dL (ref 0.20–1.20)

## 2013-09-28 MED ORDER — ONDANSETRON 16 MG/50ML IVPB (CHCC)
INTRAVENOUS | Status: AC
  Filled 2013-09-28: qty 16

## 2013-09-28 MED ORDER — DOCETAXEL CHEMO INJECTION 160 MG/16ML
75.0000 mg/m2 | Freq: Once | INTRAVENOUS | Status: AC
  Administered 2013-09-28: 190 mg via INTRAVENOUS
  Filled 2013-09-28: qty 19

## 2013-09-28 MED ORDER — ACETAMINOPHEN 160 MG/5ML PO SOLN
650.0000 mg | Freq: Once | ORAL | Status: AC
  Administered 2013-09-28: 650 mg via ORAL
  Filled 2013-09-28: qty 20.3

## 2013-09-28 MED ORDER — SODIUM CHLORIDE 0.9 % IV SOLN: Freq: Once | INTRAVENOUS | Status: DC

## 2013-09-28 MED ORDER — ACETAMINOPHEN 325 MG PO TABS: 650.0000 mg | ORAL_TABLET | Freq: Once | ORAL | Status: DC

## 2013-09-28 MED ORDER — SODIUM CHLORIDE 0.9 % IJ SOLN
10.0000 mL | INTRAMUSCULAR | Status: DC | PRN
  Filled 2013-09-28: qty 10

## 2013-09-28 MED ORDER — HEPARIN SOD (PORK) LOCK FLUSH 100 UNIT/ML IV SOLN
500.0000 [IU] | Freq: Once | INTRAVENOUS | Status: DC | PRN
  Filled 2013-09-28: qty 5

## 2013-09-28 MED ORDER — DEXAMETHASONE SODIUM PHOSPHATE 20 MG/5ML IJ SOLN
20.0000 mg | Freq: Once | INTRAMUSCULAR | Status: AC
  Administered 2013-09-28: 20 mg via INTRAVENOUS

## 2013-09-28 MED ORDER — DEXAMETHASONE SODIUM PHOSPHATE 20 MG/5ML IJ SOLN
INTRAMUSCULAR | Status: AC
  Filled 2013-09-28: qty 5

## 2013-09-28 MED ORDER — ACETAMINOPHEN 160 MG/5ML PO LIQD: 650.0000 mg | Freq: Once | ORAL | Status: DC

## 2013-09-28 MED ORDER — SODIUM CHLORIDE 0.9 % IV SOLN
Freq: Once | INTRAVENOUS | Status: AC
  Administered 2013-09-28: 11:00:00 via INTRAVENOUS

## 2013-09-28 MED ORDER — ACETAMINOPHEN 325 MG PO TABS
ORAL_TABLET | ORAL | Status: AC
  Filled 2013-09-28: qty 2

## 2013-09-28 MED ORDER — DIPHENHYDRAMINE HCL 25 MG PO CAPS
ORAL_CAPSULE | ORAL | Status: AC
  Filled 2013-09-28: qty 1

## 2013-09-28 MED ORDER — DIPHENHYDRAMINE HCL 25 MG PO CAPS
25.0000 mg | ORAL_CAPSULE | Freq: Once | ORAL | Status: AC
  Administered 2013-09-28: 25 mg via ORAL

## 2013-09-28 MED ORDER — TRASTUZUMAB CHEMO INJECTION 440 MG
6.0000 mg/kg | Freq: Once | INTRAVENOUS | Status: AC
  Administered 2013-09-28: 861 mg via INTRAVENOUS
  Filled 2013-09-28: qty 41

## 2013-09-28 MED ORDER — ONDANSETRON 16 MG/50ML IVPB (CHCC)
16.0000 mg | Freq: Once | INTRAVENOUS | Status: AC
  Administered 2013-09-28: 16 mg via INTRAVENOUS

## 2013-09-28 MED ORDER — SODIUM CHLORIDE 0.9 % IV SOLN
750.0000 mg | Freq: Once | INTRAVENOUS | Status: AC
  Administered 2013-09-28: 750 mg via INTRAVENOUS
  Filled 2013-09-28: qty 75

## 2013-09-28 NOTE — Progress Notes (Addendum)
[place]  Telephone:(336) [phone] Fax:(336) [phone]     ID: [name] DOB: 08-06-1970  MR#: [id number]  IWP#:[id number]  Patient Care Team: Horatio Pel, MD as PCP - General (Internal Medicine) Erroll Luna, MD as Consulting Physician (General Surgery) Chauncey Cruel, MD as Consulting Physician (Oncology) Allena Katz, MD as Consulting Physician (Obstetrics and Gynecology)  CHIEF COMPLAINT: Newly diagnosed triple positive breast cancer  CURRENT TREATMENT: neoadjuvant chemotherapy  BREAST CANCER HISTORY: From the original intake note:  The patient herself palpated a mass in her right breast mid July. She brought it to Dr. Sherlynn Stalls attention and he set the patient up for bilateral diagnostic mammography and right breast ultrasonography of the breast Center 07/28/2013. Right mammogram showed an irregular mass in the upper outer quadrant measuring 1.4 cm. There was some associated pleomorphic microcalcifications. This was palpable, at the 10:30 position 11 cm from the nipple. By palpation it measured approximately 1.5 cm. Ultrasound confirmed an hypoechoic mass with irregular borders measuring 1.1 cm. The right axilla showed a few adjacent deep lymph nodes with mild cortical thickening.  On 08/08/2013 the patient underwent biopsy of the breast mass in question. The pathology (SAA [id number]) showed an invasive ductal carcinoma, grade 2, estrogen receptor 49% positive, progesterone receptor 47% positive, both with moderate staining intensity, with an MIB-1 of 52%, and HER-2 amplification with a signals ratio of 8.1. The right axillary lymph node biopsied at the same time was benign this was felt to be possibly concordant.  And 08/15/2013 the patient underwent bilateral breast MRI showing a 2 cm irregular enhancing mass in the upper outer quadrant of the right breast. There were no additional changes no abnormal appearing lymph nodes and no masses or  abnormalities in the left breast.  The patient's subsequent history is as detailed below  INTERVAL HISTORY: [name] returns today for follow up of her breast cancer, accompanied by her mother. Today is day 1, cycle 2 of taxotere, carboplatin, and herceptin, with neulasta on day 2 for graulocyte support. The perjeta was removed because of intense diarrhea issues. Since her last visit, her diarrhea and hemorrhoids have resolved. She is only having 1-2 soft bowel movements per day. Her face dryness continues to be an issue, but she is using gentle face rinses and moisturizers and this is stable. Her nails are becoming discolored at the cuticle base and I advised her to use tea tree oil on them regularly.  REVIEW OF SYSTEMS: [name] denies fevers or chills, she had mild nausea on days 2 and 3 but this was handled with PRN antiemetics. Her appetite is unchanged.  She denies shortness of breath, chest pain, or palpitations. Her fatigue is minimal, and indeed the steroids are likely helping this along. A detailed review of systems was otherwise noncontributory.   PAST MEDICAL HISTORY: Past Medical History  Diagnosis Date  . Anxiety   . Wears contact lenses   . Snores   . Malignant neoplasm of breast (female), unspecified site     PAST SURGICAL HISTORY: Past Surgical History  Procedure Laterality Date  . Wisdom tooth extraction    . Portacath placement N/A 08/23/2013    Procedure: INSERTION PORT-A-CATH WITH ULTRA SOUND;  Surgeon: Joyice Faster. Cornett, MD;  Location: [facility];  Service: General;  Laterality: N/A;    FAMILY HISTORY Family History  Problem Relation Age of Onset  . Uterine cancer Mother 60    TAH/BSO  . Colon cancer Father 53  deceased 19  . Leukemia Maternal Grandmother     deceased 57  . Skin cancer Paternal Grandmother   . Prostate cancer Other    the patient's father died from colon cancer the age of 51. It had been diagnosed 3 years prior. The patient's  mother was diagnosed with cancer of the uterus at age 45. She is 44 years old as of August 2015. The patient had no brothers or sisters. There is a history of prostate cancer leukemia at in the family as well as melanoma, but there is no history of breast or ovarian cancer in the family.  GYNECOLOGIC HISTORY:  No LMP recorded. Menarche age 54. The patient is GX P0. She is still having regular periods. 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 She lives alone, with no pets.    ADVANCED DIRECTIVES: Not in place. At her 08/16/2013 visit the patient was given the upper. Documents 2 complete and notarize so she may name her mother, [name] focal as her healthcare power of attorney, which is the patient's intention. Ms. focal can be reached at [phone]. She currently lives in [place] but is planning to move to [place]: History  Substance Use Topics  . Smoking status: Former Smoker    Quit date: 02/20/2013  . Smokeless tobacco: Not on file  . Alcohol Use: Yes     Comment: 1/week     Colonoscopy: Never  PAP: July 2015 Bone density: Never  Lipid panel:  Allergies  Allergen Reactions  . Amoxicillin Rash    Current Outpatient Prescriptions  Medication Sig Dispense Refill  . Calcium-Vitamin D-Vitamin K (CALCIUM SOFT CHEWS PO) Take by mouth daily.      . cholestyramine (QUESTRAN) 4 G packet Take 1 packet (4 g total) by mouth 3 (three) times daily with meals.  60 each  12  . dexamethasone (DECADRON) 4 MG tablet Take 2 tablets (8 mg total) by mouth 2 (two) times daily. Start the day before Taxotere. Then again the day after chemo for 3 days.  30 tablet  1  . hydrocortisone (ANUSOL-HC) 25 MG suppository Place 1 suppository (25 mg total) rectally 2 (two) times daily.  12 suppository  0  . ibuprofen (ADVIL,MOTRIN) 200 MG tablet Take 200 mg by  mouth every 6 (six) hours as needed for mild pain.      Marland Kitchen lidocaine-prilocaine (EMLA) cream Apply 1 application topically as needed. Apply over port site 1-2 hours before chemo, then cove with plastic wrap  30 g  0  . loratadine (CLARITIN) 10 MG tablet Take 10 mg by mouth daily as needed for allergies.      Marland Kitchen LORazepam (ATIVAN) 0.5 MG tablet Take 1 tablet (0.5 mg total) by mouth at bedtime as needed (Nausea or vomiting).  30 tablet  0  . Multiple Vitamins-Minerals (MULTI ADULT GUMMIES PO) Take 2 each by mouth daily.      . ondansetron (ZOFRAN) 8 MG tablet Take 1 tablet (8 mg total) by mouth 2 (two) times daily. Start the day after chemo for 3 days. Then take as needed for nausea or vomiting.  30 tablet  1  . oxyCODONE-acetaminophen (ROXICET) 5-325 MG per tablet Take 1 tablet by mouth every 4 (four) hours as needed.  30 tablet  0  . prochlorperazine (COMPAZINE) 10 MG tablet Take 1 tablet (10 mg total)  by mouth every 6 (six) hours as needed (Nausea or vomiting).  30 tablet  1  . UNABLE TO FIND Apply 1 each topically once. Med Name: Dispense per medical necessity cranial prothesis for this patient with alopecia secondary to chemotherapy due to breast cancer history.  1 each  0   No current facility-administered medications for this visit.   Facility-Administered Medications Ordered in Other Visits  Medication Dose Route Frequency Provider Last Rate Last Dose  . CARBOplatin (PARAPLATIN) 750 mg in sodium chloride 0.9 % 250 mL chemo infusion  750 mg Intravenous Once Chauncey Cruel, MD      . DOCEtaxel (TAXOTERE) 190 mg in dextrose 5 % 500 mL chemo infusion  75 mg/m2 (Treatment Plan Actual) Intravenous Once Chauncey Cruel, MD      . heparin lock flush 100 unit/mL  500 Units Intracatheter Once PRN Chauncey Cruel, MD      . ondansetron (ZOFRAN) IVPB 16 mg  16 mg Intravenous Once Chauncey Cruel, MD   16 mg at 09/28/13 1110  . sodium chloride 0.9 % injection 10 mL  10 mL Intracatheter PRN Chauncey Cruel, MD        OBJECTIVE: Middle-aged [place] American woman who appears stated age 43 Vitals:   09/28/13 0952  BP: 125/65  Pulse: 91  Temp: 97.8 F (36.6 C)  Resp: 20     Body mass index is 52.37 kg/(m^2).     ECOG FS:1 - Symptomatic but completely ambulatory Skin: warm, dry, diffuse acne on t-zone and cheeks Sclerae unicteric, pupils equal and reactive Oropharynx clear and moist-- no thrush No cervical or supraclavicular adenopathy Lungs no rales or rhonchi Heart regular rate and rhythm Abd soft, nontender, positive bowel sounds MSK no focal spinal tenderness, no upper extremity lymphedema Neuro: nonfocal, well oriented, appropriate affect Breasts: deferred   LAB RESULTS:  CMP     Component Value Date/Time   NA 139 09/28/2013 0940   K 4.3 09/28/2013 0940   CO2 19* 09/28/2013 0940   GLUCOSE 174* 09/28/2013 0940   BUN 8.8 09/28/2013 0940   CREATININE 0.9 09/28/2013 0940   CALCIUM 9.3 09/28/2013 0940   PROT 7.4 09/28/2013 0940   ALBUMIN 3.2* 09/28/2013 0940   AST 15 09/28/2013 0940   ALT 21 09/28/2013 0940   ALKPHOS 106 09/28/2013 0940   BILITOT 0.20 09/28/2013 0940    I No results found for this basename: SPEP,  UPEP,   kappa and lambda light chains    Lab Results  Component Value Date   WBC 11.1* 09/28/2013   NEUTROABS 9.4* 09/28/2013   HGB 11.2* 09/28/2013   HCT 35.5 09/28/2013   MCV 83.9 09/28/2013   PLT 413* 09/28/2013      Chemistry      Component Value Date/Time   NA 139 09/28/2013 0940   K 4.3 09/28/2013 0940   CO2 19* 09/28/2013 0940   BUN 8.8 09/28/2013 0940   CREATININE 0.9 09/28/2013 0940      Component Value Date/Time   CALCIUM 9.3 09/28/2013 0940   ALKPHOS 106 09/28/2013 0940   AST 15 09/28/2013 0940   ALT 21 09/28/2013 0940   BILITOT 0.20 09/28/2013 0940       No results found for this basename: LABCA2    No components found with this basename: LABCA125    No results found for this basename: INR,  in the last 168 hours  Urinalysis No  results found for this basename: colorurine,  appearanceur,  labspec,  phurine,  glucoseu,  hgbur,  bilirubinur,  ketonesur,  proteinur,  urobilinogen,  nitrite,  leukocytesur    STUDIES: No results found.  ASSESSMENT: 43 y.o. [place] woman status post right breast upper outer quadrant biopsy 08/08/2013 for a triple positive invasive ductal carcinoma, grade 2, with an MIB-1 of 52%, and a suspicious lymph node biopsied at the same time pathologically benign.  (1) genetics testing pending  (2) neoadjuvant chemotherapy to consist of carboplatin, docetaxel, trastuzumab and pertuzumab for 6 cycles, starting 09/07/2013, pertuzumab removed after heavy diarrhea after cycle 1  (3) Echocardiogram on 08/25/13 demonstrated a LVEF of 60-65%.    PLAN: Deshanti is recovering well from her first cycle. The labs were reviewed in detail with her and were stable. There is a rise in her blood glucose on treatment days, where she has taken dexamethasone the night prior. We will continue to monitor this level, but no changes will be made to her anti-emetic schedule as she does have breakthrough nausea on days 2 and 3 of the cycle. We will proceed with day 1, cycle 2 of carboplatin, docetaxel, and trastuzumab today.   Anaria will return next week for labs and observation. We will repeat an MRI after 3 cycles. She understands and agrees with this plan. She has been encouraged to call with any issues that might arise before her next visit here.   Marcelino Duster, NP 09/28/2013 11:23 AM

## 2013-09-29 ENCOUNTER — Other Ambulatory Visit: Payer: Self-pay | Admitting: Nurse Practitioner

## 2013-09-29 ENCOUNTER — Ambulatory Visit (HOSPITAL_BASED_OUTPATIENT_CLINIC_OR_DEPARTMENT_OTHER): Payer: 59

## 2013-09-29 VITALS — BP 113/61 | HR 76 | Temp 98.3°F

## 2013-09-29 DIAGNOSIS — C50919 Malignant neoplasm of unspecified site of unspecified female breast: Secondary | ICD-10-CM

## 2013-09-29 DIAGNOSIS — C50419 Malignant neoplasm of upper-outer quadrant of unspecified female breast: Secondary | ICD-10-CM

## 2013-09-29 DIAGNOSIS — Z5189 Encounter for other specified aftercare: Secondary | ICD-10-CM

## 2013-09-29 DIAGNOSIS — C50411 Malignant neoplasm of upper-outer quadrant of right female breast: Secondary | ICD-10-CM

## 2013-09-29 MED ORDER — PEGFILGRASTIM INJECTION 6 MG/0.6ML
6.0000 mg | Freq: Once | SUBCUTANEOUS | Status: AC
  Administered 2013-09-29: 6 mg via SUBCUTANEOUS
  Filled 2013-09-29: qty 0.6

## 2013-10-03 ENCOUNTER — Ambulatory Visit (HOSPITAL_COMMUNITY): Payer: 59

## 2013-10-03 ENCOUNTER — Ambulatory Visit (HOSPITAL_COMMUNITY)
Admission: RE | Admit: 2013-10-03 | Discharge: 2013-10-03 | Disposition: A | Discharge status: Home / Self Care | Payer: 59 | Source: Ambulatory Visit | Attending: Internal Medicine | Admitting: Internal Medicine

## 2013-10-03 VITALS — BP 100/70 | HR 100 | Wt 310.0 lb

## 2013-10-03 DIAGNOSIS — Z79899 Other long term (current) drug therapy: Secondary | ICD-10-CM | POA: Diagnosis not present

## 2013-10-03 DIAGNOSIS — R002 Palpitations: Secondary | ICD-10-CM | POA: Insufficient documentation

## 2013-10-03 DIAGNOSIS — C50919 Malignant neoplasm of unspecified site of unspecified female breast: Secondary | ICD-10-CM | POA: Diagnosis not present

## 2013-10-03 DIAGNOSIS — Z87891 Personal history of nicotine dependence: Secondary | ICD-10-CM | POA: Diagnosis not present

## 2013-10-03 DIAGNOSIS — Z803 Family history of malignant neoplasm of breast: Secondary | ICD-10-CM | POA: Insufficient documentation

## 2013-10-03 DIAGNOSIS — Z808 Family history of malignant neoplasm of other organs or systems: Secondary | ICD-10-CM | POA: Diagnosis not present

## 2013-10-03 DIAGNOSIS — Z8049 Family history of malignant neoplasm of other genital organs: Secondary | ICD-10-CM | POA: Diagnosis not present

## 2013-10-03 DIAGNOSIS — R609 Edema, unspecified: Secondary | ICD-10-CM | POA: Diagnosis not present

## 2013-10-03 DIAGNOSIS — F411 Generalized anxiety disorder: Secondary | ICD-10-CM | POA: Diagnosis not present

## 2013-10-03 NOTE — Progress Notes (Signed)
Patient ID: [name], female   DOB: 09-13-1970, 43 y.o.   MRN: [id number] PCP: Dr Aggie Hacker, MD as Consulting Physician (General Surgery)  Chauncey Cruel, MD as Consulting Physician (Oncology)  Shon Millet II, MD as Consulting Physician (Obstetrics and Gynecology) Refrring MD: Dr Jana Hakim  HPI: [name] is a 43 year old right breast upper outer quadrant biopsy 08/08/2013 for a triple positive invasive ductal carcinoma, grade 2, with an MIB-1 of 52%, and a suspicious lymph node biopsied at the same time pathologically benign referred to cardio onc clininc by Dr Jana Hakim.  Currently on neoadjuvant chemotherapy :  carboplatin, docetaxel, trastuzumab and pertuzumab for 6 cycles, started 09/07/2013. Intolerant pertuzumab due to diarrhea.    Over she is feeling ok. Denies SOB/Orthopnea. Last night had palpitations and thought it was going fast. Does not exercise.    ECHO 08/2013 EF 60-65% Lat S' 10.4 GLS -21.6   FH: Grandfather- MI x3 Paternal 74 Uncles with CAD.   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 Has occasional alcohol.   Review of Systems:     Cardiac Review of Systems: {Y] = yes [ ]  = no  Chest Pain [    ]  Resting SOB [   ] Exertional SOB  [  ]  Orthopnea [  ]   Pedal Edema [   ]    Palpitations [  ] Syncope  [  ]   Presyncope [   ]  General Review of Systems: [Y] = yes [  ]=no Constitional: recent weight change [  ]; anorexia [  ]; fatigue [ Y ]; nausea [  ]; night sweats [  ]; fever [  ]; or chills [  ];                                                                                                                                          Dental: poor dentition[  ]; Last Dentist visit:  Eye : blurred vision [  ]; diplopia [   ]; vision changes [  ];  Amaurosis fugax[  ]; Resp: cough [  ];  wheezing[  ];  hemoptysis[  ]; shortness of breath[  ]; paroxysmal nocturnal dyspnea[  ]; dyspnea on exertion[  ]; or orthopnea[  ];  GI:  gallstones[  ],  vomiting[  ];  dysphagia[  ]; melena[  ];  hematochezia [  ]; heartburn[  ];   Hx of  Colonoscopy[  ]; GU: kidney stones [  ]; hematuria[  ];   dysuria [  ];  nocturia[  ];  history of     obstruction [  ];                 Skin: rash, swelling[  ];, hair loss[  ];  peripheral edema[  ];  or itching[  ]; Musculosketetal:  myalgias[  ];  joint swelling[  ];  joint erythema[  ];  joint pain[  ];  back pain[Y  ];  Heme/Lymph: bruising[  ];  bleeding[  ];  anemia[  ];  Neuro: TIA[  ];  headaches[  ];  stroke[  ];  vertigo[  ];  seizures[  ];   paresthesias[  ];  difficulty walking[  ];  Psych:depression[  ]; anxiety[  ];  Endocrine: diabetes[  ];  thyroid dysfunction[  ];  Immunizations: Flu [  ]; Pneumococcal[  ];  Other: Occasional palpitations.    Past Medical History  Diagnosis Date  . Anxiety   . Wears contact lenses   . Snores   . Malignant neoplasm of breast (female), unspecified site     Current Outpatient Prescriptions  Medication Sig Dispense Refill  . acetaminophen (TYLENOL) 160 MG/5ML liquid Take 20.3 mLs (650 mg total) by mouth once.  120 mL  0  . Calcium-Vitamin D-Vitamin K (CALCIUM SOFT CHEWS PO) Take by mouth daily.      . cholestyramine (QUESTRAN) 4 G packet Take 1 packet (4 g total) by mouth 3 (three) times daily with meals.  60 each  12  . dexamethasone (DECADRON) 4 MG tablet Take 2 tablets (8 mg total) by mouth 2 (two) times daily. Start the day before Taxotere. Then again the day after chemo for 3 days.  30 tablet  1  . hydrocortisone (ANUSOL-HC) 25 MG suppository Place 1 suppository (25 mg total) rectally 2 (two) times daily.  12 suppository  0  . lidocaine-prilocaine (EMLA) cream Apply 1 application topically as needed. Apply over port site 1-2 hours before chemo, then cove with plastic wrap  30 g  0  . loratadine (CLARITIN) 10 MG tablet Take 10 mg by mouth daily as needed for allergies.      Marland Kitchen LORazepam (ATIVAN) 0.5 MG tablet Take 1 tablet (0.5 mg total) by mouth at  bedtime as needed (Nausea or vomiting).  30 tablet  0  . Multiple Vitamins-Minerals (MULTI ADULT GUMMIES PO) Take 2 each by mouth daily.      . ondansetron (ZOFRAN) 8 MG tablet Take 1 tablet (8 mg total) by mouth 2 (two) times daily. Start the day after chemo for 3 days. Then take as needed for nausea or vomiting.  30 tablet  1  . oxyCODONE-acetaminophen (ROXICET) 5-325 MG per tablet Take 1 tablet by mouth every 4 (four) hours as needed.  30 tablet  0  . prochlorperazine (COMPAZINE) 10 MG tablet Take 1 tablet (10 mg total) by mouth every 6 (six) hours as needed (Nausea or vomiting).  30 tablet  1  . UNABLE TO FIND Apply 1 each topically once. Med Name: Dispense per medical necessity cranial prothesis for this patient with alopecia secondary to chemotherapy due to breast cancer history.  1 each  0   No current facility-administered medications for this encounter.     Allergies  Allergen Reactions  . Amoxicillin Rash    History   Social History  . Marital Status: Single    Spouse Name: N/A    Number of Children: N/A  . Years of Education: N/A   Occupational History  . Not on file.   Social History Main Topics  . Smoking status: Former Smoker    Quit date: 02/20/2013  . Smokeless tobacco: Not on file  . Alcohol Use: Yes     Comment: 1/week  . Drug Use: No  . Sexual Activity: Not on  file   Other Topics Concern  . Not on file   Social History Narrative  . No narrative on file    Family History  Problem Relation Age of Onset  . Uterine cancer Mother 79    TAH/BSO  . Colon cancer Father 2    deceased 17  . Leukemia Maternal Grandmother     deceased 72  . Skin cancer Paternal Grandmother   . Prostate cancer Other     PHYSICAL EXAM: Filed Vitals:   10/03/13 1157  BP: 100/70  Pulse: 100   General:  Well appearing. No respiratory difficulty HEENT: normal Neck: supple. no JVD. Carotids 2+ bilat; no bruits. No lymphadenopathy or thryomegaly appreciated. Cor: PMI  nondisplaced. Regular rate & rhythm. No rubs, gallops or murmurs. Lungs: clear Abdomen: soft, nontender, nondistended. No hepatosplenomegaly. No bruits or masses. Good bowel sounds. Extremities: no cyanosis, clubbing, rash, R and LLL edema. 1+ primarily ankles.  Neuro: alert & oriented x 3, cranial nerves grossly intact. moves all 4 extremities w/o difficulty. Affect pleasant.    No results found for this or any previous visit (from the past 24 hour(s)). No results found.   ASSESSMENT & PLAN:  1.  R Breast Cancer- Triple + invasive ductal carcinoma, grade 2.  Completed 2/6 cycles  carboplatin, docetaxel, and trastuzumab. Intolerant pertuzumab Explained the purpose of the cardio-onc clinic to include that there is ~10% chance of cardio toxicity while receiving Herceptin. Dr Aundra Dubin discussed and reviewed ECHO.  Plan to follow ECHO every 3 months 2. Lower Extremity edema- May need prn lasix  3. Palpitations-Intermittent not lasting long.  If persists will place 24 hour holter monitor   Follow up in 3 months with MD  With an ECHO   CLEGG,AMY NP-C  12:28 PM  Patient seen with NP, agree with the above note.  Echo was reviewed today, looks ok to continue Herceptin.  Will repeat echo in 3 months with office visit.   Loralie Champagne 10/03/2013 1:36 PM

## 2013-10-03 NOTE — Patient Instructions (Addendum)
Follow up in 3 months with an ECHO 

## 2013-10-05 ENCOUNTER — Ambulatory Visit (HOSPITAL_BASED_OUTPATIENT_CLINIC_OR_DEPARTMENT_OTHER): Payer: 59 | Admitting: Nurse Practitioner

## 2013-10-05 ENCOUNTER — Ambulatory Visit: Payer: 59

## 2013-10-05 ENCOUNTER — Other Ambulatory Visit: Payer: Self-pay | Admitting: *Deleted

## 2013-10-05 ENCOUNTER — Encounter: Payer: Self-pay | Admitting: Nurse Practitioner

## 2013-10-05 ENCOUNTER — Other Ambulatory Visit (HOSPITAL_BASED_OUTPATIENT_CLINIC_OR_DEPARTMENT_OTHER): Payer: 59

## 2013-10-05 ENCOUNTER — Telehealth: Payer: Self-pay | Admitting: Oncology

## 2013-10-05 VITALS — BP 120/53 | HR 93 | Temp 98.1°F | Resp 18 | Ht 65.0 in | Wt 309.0 lb

## 2013-10-05 DIAGNOSIS — C50419 Malignant neoplasm of upper-outer quadrant of unspecified female breast: Secondary | ICD-10-CM

## 2013-10-05 DIAGNOSIS — Z171 Estrogen receptor negative status [ER-]: Secondary | ICD-10-CM

## 2013-10-05 DIAGNOSIS — C50411 Malignant neoplasm of upper-outer quadrant of right female breast: Secondary | ICD-10-CM

## 2013-10-05 DIAGNOSIS — T451X5A Adverse effect of antineoplastic and immunosuppressive drugs, initial encounter: Secondary | ICD-10-CM | POA: Insufficient documentation

## 2013-10-05 DIAGNOSIS — R739 Hyperglycemia, unspecified: Secondary | ICD-10-CM

## 2013-10-05 DIAGNOSIS — R110 Nausea: Secondary | ICD-10-CM

## 2013-10-05 DIAGNOSIS — R198 Other specified symptoms and signs involving the digestive system and abdomen: Secondary | ICD-10-CM | POA: Insufficient documentation

## 2013-10-05 DIAGNOSIS — K649 Unspecified hemorrhoids: Secondary | ICD-10-CM

## 2013-10-05 DIAGNOSIS — T380X5A Adverse effect of glucocorticoids and synthetic analogues, initial encounter: Secondary | ICD-10-CM

## 2013-10-05 LAB — CBC WITH DIFFERENTIAL/PLATELET
BASO%: 0.3 % (ref 0.0–2.0)
Basophils Absolute: 0.1 10*3/uL (ref 0.0–0.1)
EOS%: 0.4 % (ref 0.0–7.0)
Eosinophils Absolute: 0.1 10*3/uL (ref 0.0–0.5)
HCT: 36.1 % (ref 34.8–46.6)
HGB: 11.2 g/dL — ABNORMAL LOW (ref 11.6–15.9)
LYMPH#: 5.1 10*3/uL — AB (ref 0.9–3.3)
LYMPH%: 25.3 % (ref 14.0–49.7)
MCH: 26.1 pg (ref 25.1–34.0)
MCHC: 31.2 g/dL — AB (ref 31.5–36.0)
MCV: 83.8 fL (ref 79.5–101.0)
MONO#: 1.8 10*3/uL — AB (ref 0.1–0.9)
MONO%: 8.7 % (ref 0.0–14.0)
NEUT#: 13.2 10*3/uL — ABNORMAL HIGH (ref 1.5–6.5)
NEUT%: 65.3 % (ref 38.4–76.8)
PLATELETS: 210 10*3/uL (ref 145–400)
RBC: 4.3 10*6/uL (ref 3.70–5.45)
RDW: 16.7 % — ABNORMAL HIGH (ref 11.2–14.5)
WBC: 20.3 10*3/uL — ABNORMAL HIGH (ref 3.9–10.3)

## 2013-10-05 MED ORDER — HYDROCORTISONE ACETATE 25 MG RE SUPP: 25.0000 mg | Freq: Two times a day (BID) | RECTAL | Status: DC

## 2013-10-05 NOTE — Progress Notes (Signed)
[place]  [phone] [phone]) [phone]     ID: [name] DOB: 1970-10-11  MR#: [id number]  TIR#:[id number]  Patient Care Team: Horatio Pel, MD as PCP - General (Internal Medicine) Erroll Luna, MD as Consulting Physician (General Surgery) Chauncey Cruel, MD as Consulting Physician (Oncology) Allena Katz, MD as Consulting Physician (Obstetrics and Gynecology)  CHIEF COMPLAINT: Newly diagnosed triple positive breast cancer  CURRENT TREATMENT: neoadjuvant chemotherapy  BREAST CANCER HISTORY: From the original intake note:  The patient herself palpated a mass in her right breast mid July. She brought it to Dr. Sherlynn Stalls attention and he set the patient up for bilateral diagnostic mammography and right breast ultrasonography of the breast Center 07/28/2013. Right mammogram showed an irregular mass in the upper outer quadrant measuring 1.4 cm. There was some associated pleomorphic microcalcifications. This was palpable, at the 10:30 position 11 cm from the nipple. By palpation it measured approximately 1.5 cm. Ultrasound confirmed an hypoechoic mass with irregular borders measuring 1.1 cm. The right axilla showed a few adjacent deep lymph nodes with mild cortical thickening.  On 08/08/2013 the patient underwent biopsy of the breast mass in question. The pathology ([id number]) showed an invasive ductal carcinoma, grade 2, estrogen receptor 49% positive, progesterone receptor 47% positive, both with moderate staining intensity, with an MIB-1 of 52%, and HER-2 amplification with a signals ratio of 8.1. The right axillary lymph node biopsied at the same time was benign this was felt to be possibly concordant.  And 08/15/2013 the patient underwent bilateral breast MRI showing a 2 cm irregular enhancing mass in the upper outer quadrant of the right breast. There were no additional changes no abnormal appearing lymph nodes and no masses or  abnormalities in the left breast.  The patient's subsequent history is as detailed below  INTERVAL HISTORY: [name] returns today for follow up of her breast cancer, accompanied by her mother. Today is day 8, cycle 2 of taxotere, carboplatin, and herceptin, with neulasta on day 2 for graulocyte support. The perjeta was removed because of intense diarrhea issues. [name]. She has delayed nausea up to 5-6 days after chemo, but manages this well with compazine and lorazepam at night. Her appetite is stifled as she has increasingly noted a change in her taste sensation. She is challenged by oscillating constipation and diarrhea. She is down to using half doses of questran.   REVIEW OF SYSTEMS: She denies shortness of breath, chest pain, palpitations, cough, or fatigue. She is currently on her period, the first one since treatment started, and the best that she can describe it is "weird." Maybe lighter than previously but that is yet to be determined. A detailed review of systems was otherwise noncontributory.   PAST MEDICAL HISTORY: Past Medical History  Diagnosis Date  . Anxiety   . Wears contact lenses   . Snores   . Malignant neoplasm of breast (female), unspecified site     PAST SURGICAL HISTORY: Past Surgical History  Procedure Laterality Date  . Wisdom tooth extraction    . Portacath placement N/A 08/23/2013    Procedure: INSERTION PORT-A-CATH WITH ULTRA SOUND;  Surgeon: Joyice Faster. Cornett, MD;  Location: [facility];  Service: General;  Laterality: N/A;    FAMILY HISTORY Family History  Problem Relation Age of Onset  . Uterine cancer Mother 58    TAH/BSO  . Colon cancer Father 81    deceased 24  . Leukemia Maternal  Grandmother     deceased 21  . Skin cancer Paternal Grandmother   . Prostate cancer Other    the patient's father died from colon cancer the age of 88. It had been diagnosed 3 years prior. The patient's mother was  diagnosed with cancer of the uterus at age 48. She is 44 years old as of August 2015. The patient had no brothers or sisters. There is a history of prostate cancer leukemia at in the family as well as melanoma, but there is no history of breast or ovarian cancer in the family.  GYNECOLOGIC HISTORY:  No LMP recorded. Menarche age 14. The patient is GX P0. She is still having regular periods. 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 She lives alone, with no pets.    ADVANCED DIRECTIVES: Not in place. At her 08/16/2013 visit the patient was given the upper. Documents 2 complete and notarize so she may name her mother, [name] as her healthcare power of attorney, which is the patient's intention. Ms. [name] can be reached at [phone]. She currently lives in [place] but is planning to move to [place]: History  Substance Use Topics  . Smoking status: Former Smoker    Quit date: 02/20/2013  . Smokeless tobacco: Not on file  . Alcohol Use: Yes     Comment: 1/week     Colonoscopy: Never  PAP: July 2015 Bone density: Never  Lipid panel:  Allergies  Allergen Reactions  . Amoxicillin Rash    Current Outpatient Prescriptions  Medication Sig Dispense Refill  . acetaminophen (TYLENOL) 160 MG/5ML liquid Take 20.3 mLs (650 mg total) by mouth once.  120 mL  0  . Calcium-Vitamin D-Vitamin K (CALCIUM SOFT CHEWS PO) Take by mouth daily.      . cholestyramine (QUESTRAN) 4 G packet Take 1 packet (4 g total) by mouth 3 (three) times daily with meals.  60 each  12  . dexamethasone (DECADRON) 4 MG tablet Take 2 tablets (8 mg total) by mouth 2 (two) times daily. Start the day before Taxotere. Then again the day after chemo for 3 days.  30 tablet  1  . lidocaine-prilocaine (EMLA) cream Apply 1 application topically as needed. Apply over port site 1-2  hours before chemo, then cove with plastic wrap  30 g  0  . loratadine (CLARITIN) 10 MG tablet Take 10 mg by mouth daily as needed for allergies.      Marland Kitchen LORazepam (ATIVAN) 0.5 MG tablet Take 1 tablet (0.5 mg total) by mouth at bedtime as needed (Nausea or vomiting).  30 tablet  0  . Multiple Vitamins-Minerals (MULTI ADULT GUMMIES PO) Take 2 each by mouth daily.      . ondansetron (ZOFRAN) 8 MG tablet Take 1 tablet (8 mg total) by mouth 2 (two) times daily. Start the day after chemo for 3 days. Then take as needed for nausea or vomiting.  30 tablet  1  . oxyCODONE-acetaminophen (ROXICET) 5-325 MG per tablet Take 1 tablet by mouth every 4 (four) hours as needed.  30 tablet  0  . prochlorperazine (COMPAZINE) 10 MG tablet Take 1 tablet (10 mg total) by mouth every 6 (six) hours as needed (Nausea or vomiting).  30 tablet  1  . UNABLE TO FIND Apply 1 each topically once. Med Name: Dispense per medical necessity cranial  prothesis for this patient with alopecia secondary to chemotherapy due to breast cancer history.  1 each  0  . hydrocortisone (ANUSOL-HC) 25 MG suppository Place 1 suppository (25 mg total) rectally 2 (two) times daily.  12 suppository  1   No current facility-administered medications for this visit.    OBJECTIVE: Middle-aged [place] American woman who appears stated age 58 Vitals:   10/05/13 1125  BP: 120/53  Pulse: 93  Temp: 98.1 F (36.7 C)  Resp: 18     Body mass index is 51.42 kg/(m^2).     ECOG FS:1 - Symptomatic but completely ambulatory  Skin: warm, dry, diffuse acne on t-zone and cheeks, nail beds hyperpigmented HEENT: sclerae anicteric, conjunctivae pink, oropharynx clear. No thrush or mucositis.  Lymph Nodes: No cervical or supraclavicular lymphadenopathy  Lungs: clear to auscultation bilaterally, no rales, wheezes, or rhonci  Heart: regular rate and rhythm  Abdomen: round, soft, non tender, positive bowel sounds  Musculoskeletal: No [name] spinal tenderness, no  peripheral edema  Neuro: non [name], well oriented, positive affect  Breasts: deferred  LAB RESULTS:  CMP     Component Value Date/Time   NA 139 09/28/2013 0940   K 4.3 09/28/2013 0940   CO2 19* 09/28/2013 0940   GLUCOSE 174* 09/28/2013 0940   BUN 8.8 09/28/2013 0940   CREATININE 0.9 09/28/2013 0940   CALCIUM 9.3 09/28/2013 0940   PROT 7.4 09/28/2013 0940   ALBUMIN 3.2* 09/28/2013 0940   AST 15 09/28/2013 0940   ALT 21 09/28/2013 0940   ALKPHOS 106 09/28/2013 0940   BILITOT 0.20 09/28/2013 0940    I No results found for this basename: SPEP,  UPEP,   kappa and lambda light chains    Lab Results  Component Value Date   WBC 20.3* 10/05/2013   NEUTROABS 13.2* 10/05/2013   HGB 11.2* 10/05/2013   HCT 36.1 10/05/2013   MCV 83.8 10/05/2013   PLT 210 10/05/2013      Chemistry      Component Value Date/Time   NA 139 09/28/2013 0940   K 4.3 09/28/2013 0940   CO2 19* 09/28/2013 0940   BUN 8.8 09/28/2013 0940   CREATININE 0.9 09/28/2013 0940      Component Value Date/Time   CALCIUM 9.3 09/28/2013 0940   ALKPHOS 106 09/28/2013 0940   AST 15 09/28/2013 0940   ALT 21 09/28/2013 0940   BILITOT 0.20 09/28/2013 0940       No results found for this basename: LABCA2    No components found with this basename: LABCA125    No results found for this basename: INR,  in the last 168 hours  Urinalysis No results found for this basename: colorurine,  appearanceur,  labspec,  phurine,  glucoseu,  hgbur,  bilirubinur,  ketonesur,  proteinur,  urobilinogen,  nitrite,  leukocytesur    STUDIES: No results found.  ASSESSMENT: 43 y.o. [place] woman status post right breast upper outer quadrant biopsy 08/08/2013 for a triple positive invasive ductal carcinoma, grade 2, with an MIB-1 of 52%, and a suspicious lymph node biopsied at the same time pathologically benign.  (1) genetics testing pending  (2) neoadjuvant chemotherapy to consist of carboplatin, docetaxel, trastuzumab and pertuzumab for 6 cycles,  starting 09/07/2013, pertuzumab removed after heavy diarrhea after cycle 1  (3) Echocardiogram on 08/25/13 demonstrated a LVEF of 60-65%.    PLAN: [name] is doing well today. The CBC was reviewed in detail with her and was completely stable. A CMET was not drawn,  however the patient states that she has taken her diet seriously and has cut down on her sugar and carbohydrate intake. We will keep the dexamethasone in her anti-emetic regimen at this time because of prolonged nausea. She will continue to balance her questran and imodium use with her irregular bowels as needed.  [name] will return in 2 weeks for the start of cycle 3. We will repeat an MRI after the following week. She understands and agrees with this plan. She has been encouraged to call with any issues that might arise before her next visit here.   Marcelino Duster, NP 10/05/2013 12:03 PM

## 2013-10-05 NOTE — Telephone Encounter (Signed)
, °

## 2013-10-06 ENCOUNTER — Telehealth: Payer: Self-pay | Admitting: *Deleted

## 2013-10-06 NOTE — Telephone Encounter (Signed)
Per staff message and POF I have scheduled appts. Advised scheduler of appts. JMW  

## 2013-10-19 ENCOUNTER — Other Ambulatory Visit (HOSPITAL_BASED_OUTPATIENT_CLINIC_OR_DEPARTMENT_OTHER): Payer: 59

## 2013-10-19 ENCOUNTER — Encounter: Payer: Self-pay | Admitting: Nurse Practitioner

## 2013-10-19 ENCOUNTER — Other Ambulatory Visit: Payer: Self-pay | Admitting: Oncology

## 2013-10-19 ENCOUNTER — Ambulatory Visit (HOSPITAL_BASED_OUTPATIENT_CLINIC_OR_DEPARTMENT_OTHER): Payer: 59

## 2013-10-19 ENCOUNTER — Ambulatory Visit (HOSPITAL_BASED_OUTPATIENT_CLINIC_OR_DEPARTMENT_OTHER): Payer: 59 | Admitting: Nurse Practitioner

## 2013-10-19 VITALS — BP 126/69 | HR 86 | Temp 98.5°F | Resp 20 | Ht 65.0 in | Wt 308.6 lb

## 2013-10-19 DIAGNOSIS — C50919 Malignant neoplasm of unspecified site of unspecified female breast: Secondary | ICD-10-CM

## 2013-10-19 DIAGNOSIS — C50411 Malignant neoplasm of upper-outer quadrant of right female breast: Secondary | ICD-10-CM

## 2013-10-19 DIAGNOSIS — Z170 Estrogen receptor positive status [ER+]: Secondary | ICD-10-CM

## 2013-10-19 DIAGNOSIS — Z5111 Encounter for antineoplastic chemotherapy: Secondary | ICD-10-CM

## 2013-10-19 DIAGNOSIS — Z5112 Encounter for antineoplastic immunotherapy: Secondary | ICD-10-CM

## 2013-10-19 DIAGNOSIS — G4700 Insomnia, unspecified: Secondary | ICD-10-CM

## 2013-10-19 LAB — COMPREHENSIVE METABOLIC PANEL (CC13)
ALBUMIN: 3.4 g/dL — AB (ref 3.5–5.0)
ALT: 28 U/L (ref 0–55)
ANION GAP: 8 meq/L (ref 3–11)
AST: 15 U/L (ref 5–34)
Alkaline Phosphatase: 99 U/L (ref 40–150)
BUN: 12.7 mg/dL (ref 7.0–26.0)
CALCIUM: 9.8 mg/dL (ref 8.4–10.4)
CO2: 23 meq/L (ref 22–29)
CREATININE: 0.8 mg/dL (ref 0.6–1.1)
Chloride: 109 mEq/L (ref 98–109)
Glucose: 125 mg/dl (ref 70–140)
Potassium: 4.2 mEq/L (ref 3.5–5.1)
Sodium: 140 mEq/L (ref 136–145)
TOTAL PROTEIN: 7.4 g/dL (ref 6.4–8.3)
Total Bilirubin: 0.21 mg/dL (ref 0.20–1.20)

## 2013-10-19 LAB — CBC WITH DIFFERENTIAL/PLATELET
BASO%: 0.1 % (ref 0.0–2.0)
Basophils Absolute: 0 10*3/uL (ref 0.0–0.1)
EOS%: 0 % (ref 0.0–7.0)
Eosinophils Absolute: 0 10*3/uL (ref 0.0–0.5)
HEMATOCRIT: 34.5 % — AB (ref 34.8–46.6)
HGB: 11.1 g/dL — ABNORMAL LOW (ref 11.6–15.9)
LYMPH%: 17.9 % (ref 14.0–49.7)
MCH: 26.7 pg (ref 25.1–34.0)
MCHC: 32.2 g/dL (ref 31.5–36.0)
MCV: 83.1 fL (ref 79.5–101.0)
MONO#: 0.2 10*3/uL (ref 0.1–0.9)
MONO%: 2.5 % (ref 0.0–14.0)
NEUT#: 7.4 10*3/uL — ABNORMAL HIGH (ref 1.5–6.5)
NEUT%: 79.5 % — AB (ref 38.4–76.8)
Platelets: 374 10*3/uL (ref 145–400)
RBC: 4.15 10*6/uL (ref 3.70–5.45)
RDW: 17.4 % — ABNORMAL HIGH (ref 11.2–14.5)
WBC: 9.3 10*3/uL (ref 3.9–10.3)
lymph#: 1.7 10*3/uL (ref 0.9–3.3)

## 2013-10-19 MED ORDER — SODIUM CHLORIDE 0.9 % IJ SOLN
10.0000 mL | INTRAMUSCULAR | Status: DC | PRN
  Administered 2013-10-19: 10 mL
  Filled 2013-10-19: qty 10

## 2013-10-19 MED ORDER — DIPHENHYDRAMINE HCL 25 MG PO CAPS
25.0000 mg | ORAL_CAPSULE | Freq: Once | ORAL | Status: AC
  Administered 2013-10-19: 25 mg via ORAL

## 2013-10-19 MED ORDER — DOCETAXEL CHEMO INJECTION 160 MG/16ML
75.0000 mg/m2 | Freq: Once | INTRAVENOUS | Status: AC
  Administered 2013-10-19: 190 mg via INTRAVENOUS
  Filled 2013-10-19: qty 19

## 2013-10-19 MED ORDER — HEPARIN SOD (PORK) LOCK FLUSH 100 UNIT/ML IV SOLN
500.0000 [IU] | Freq: Once | INTRAVENOUS | Status: AC | PRN
  Administered 2013-10-19: 500 [IU]
  Filled 2013-10-19: qty 5

## 2013-10-19 MED ORDER — ONDANSETRON 16 MG/50ML IVPB (CHCC)
INTRAVENOUS | Status: AC
  Filled 2013-10-19: qty 16

## 2013-10-19 MED ORDER — ONDANSETRON 16 MG/50ML IVPB (CHCC)
16.0000 mg | Freq: Once | INTRAVENOUS | Status: AC
  Administered 2013-10-19: 16 mg via INTRAVENOUS

## 2013-10-19 MED ORDER — DEXAMETHASONE SODIUM PHOSPHATE 20 MG/5ML IJ SOLN
20.0000 mg | Freq: Once | INTRAMUSCULAR | Status: AC
  Administered 2013-10-19: 20 mg via INTRAVENOUS

## 2013-10-19 MED ORDER — ACETAMINOPHEN 325 MG PO TABS
ORAL_TABLET | ORAL | Status: AC
  Filled 2013-10-19: qty 2

## 2013-10-19 MED ORDER — DIPHENHYDRAMINE HCL 25 MG PO CAPS
ORAL_CAPSULE | ORAL | Status: AC
  Filled 2013-10-19: qty 1

## 2013-10-19 MED ORDER — SODIUM CHLORIDE 0.9 % IV SOLN
750.0000 mg | Freq: Once | INTRAVENOUS | Status: AC
  Administered 2013-10-19: 750 mg via INTRAVENOUS
  Filled 2013-10-19: qty 75

## 2013-10-19 MED ORDER — SODIUM CHLORIDE 0.9 % IV SOLN
Freq: Once | INTRAVENOUS | Status: AC
  Administered 2013-10-19: 10:00:00 via INTRAVENOUS

## 2013-10-19 MED ORDER — DEXAMETHASONE SODIUM PHOSPHATE 20 MG/5ML IJ SOLN
INTRAMUSCULAR | Status: AC
  Filled 2013-10-19: qty 5

## 2013-10-19 MED ORDER — ACETAMINOPHEN 325 MG PO TABS
650.0000 mg | ORAL_TABLET | Freq: Once | ORAL | Status: AC
  Administered 2013-10-19: 650 mg via ORAL

## 2013-10-19 MED ORDER — TRASTUZUMAB CHEMO INJECTION 440 MG
6.0000 mg/kg | Freq: Once | INTRAVENOUS | Status: AC
  Administered 2013-10-19: 861 mg via INTRAVENOUS
  Filled 2013-10-19: qty 41

## 2013-10-19 NOTE — Progress Notes (Signed)
[name]  Telephone:(336) [phone] Fax:(336) [phone]     ID: [name] DOB: Dec 28, 1970  MR#: [id number]  ZES#:[id number]  Patient Care Team: Horatio Pel, MD as PCP - General (Internal Medicine) Erroll Luna, MD as Consulting Physician (General Surgery) Chauncey Cruel, MD as Consulting Physician (Oncology) Allena Katz, MD as Consulting Physician (Obstetrics and Gynecology)  CHIEF COMPLAINT: Newly diagnosed triple positive breast cancer  CURRENT TREATMENT: neoadjuvant chemotherapy  BREAST CANCER HISTORY: From the original intake note:  The patient herself palpated a mass in her right breast mid July. She brought it to Dr. Sherlynn Stalls attention and he set the patient up for bilateral diagnostic mammography and right breast ultrasonography of the breast Center 07/28/2013. Right mammogram showed an irregular mass in the upper outer quadrant measuring 1.4 cm. There was some associated pleomorphic microcalcifications. This was palpable, at the 10:30 position 11 cm from the nipple. By palpation it measured approximately 1.5 cm. Ultrasound confirmed an hypoechoic mass with irregular borders measuring 1.1 cm. The right axilla showed a few adjacent deep lymph nodes with mild cortical thickening.  On 08/08/2013 the patient underwent biopsy of the breast mass in question. The pathology ([id number]) showed an invasive ductal carcinoma, grade 2, estrogen receptor 49% positive, progesterone receptor 47% positive, both with moderate staining intensity, with an MIB-1 of 52%, and HER-2 amplification with a signals ratio of 8.1. The right axillary lymph node biopsied at the same time was benign this was felt to be possibly concordant.  And 08/15/2013 the patient underwent bilateral breast MRI showing a 2 cm irregular enhancing mass in the upper outer quadrant of the right breast. There were no additional changes no abnormal appearing lymph nodes and no masses or  abnormalities in the left breast.  The patient's subsequent history is as detailed below  INTERVAL HISTORY: [name] returns today for follow up of her breast cancer, accompanied by her mother. Today is day 1, cycle 3 of taxotere, carboplatin, and herceptin, with neulasta on day 2 for graulocyte support. The perjeta was removed because of intense diarrhea issues early on in treatment. [name] has tolerated this regimen moderately well. She has had no more nausea, managed well with compazine and zofran. Her appetite is healthy, though her taste changes acutely after treatment. She continues to battle some diarrhea starting on day 4-6, but this is relieved with imodium use. She is not using questran at this time.   REVIEW OF SYSTEMS: [name] denies fevers or chills. She has no rash, mouth sores, or peripheral neuropathy symptoms. She denies shortness of breath, chest pain, palpitations, or cough. She has some fatigue, but mainly has trouble sleeping on the days that she is taking dexamethasone. She has rid herself of concentrated sweets in order to manage her blood sugar. A detailed review of systems is otherwise stable at this time.   PAST MEDICAL HISTORY: Past Medical History  Diagnosis Date  . Anxiety   . Wears contact lenses   . Snores   . Malignant neoplasm of breast (female), unspecified site     PAST SURGICAL HISTORY: Past Surgical History  Procedure Laterality Date  . Wisdom tooth extraction    . Portacath placement N/A 08/23/2013    Procedure: INSERTION PORT-A-CATH WITH ULTRA SOUND;  Surgeon: Joyice Faster. Cornett, MD;  Location: [facility];  Service: General;  Laterality: N/A;    FAMILY HISTORY Family History  Problem Relation Age of Onset  . Uterine cancer Mother 40  TAH/BSO  . Colon cancer Father 79    deceased 19  . Leukemia Maternal Grandmother     deceased 33  . Skin cancer Paternal Grandmother   . Prostate cancer Other    the patient's father died from  colon cancer the age of 42. It had been diagnosed 3 years prior. The patient's mother was diagnosed with cancer of the uterus at age 72. She is 44 years old as of August 2015. The patient had no brothers or sisters. There is a history of prostate cancer leukemia at in the family as well as melanoma, but there is no history of breast or ovarian cancer in the family.  GYNECOLOGIC HISTORY:  No LMP recorded. Menarche age 60. The patient is GX P0. She is still having regular periods. 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 She lives alone, with no pets.    ADVANCED DIRECTIVES: Not in place. At her 08/16/2013 visit the patient was given the upper. Documents 2 complete and notarize so she may name her mother, [name] as her healthcare power of attorney, which is the patient's intention. Ms. [name] can be reached at [phone]. She currently lives in [place] but is planning to move to [place]: History  Substance Use Topics  . Smoking status: Former Smoker    Quit date: 02/20/2013  . Smokeless tobacco: Not on file  . Alcohol Use: Yes     Comment: 1/week     Colonoscopy: Never  PAP: July 2015 Bone density: Never  Lipid panel:  Allergies  Allergen Reactions  . Amoxicillin Rash    Current Outpatient Prescriptions  Medication Sig Dispense Refill  . acetaminophen (TYLENOL) 160 MG/5ML liquid Take 20.3 mLs (650 mg total) by mouth once.  120 mL  0  . Calcium-Vitamin D-Vitamin K (CALCIUM SOFT CHEWS PO) Take by mouth daily.      . cholestyramine (QUESTRAN) 4 G packet Take 1 packet (4 g total) by mouth 3 (three) times daily with meals.  60 each  12  . dexamethasone (DECADRON) 4 MG tablet Take 2 tablets (8 mg total) by mouth 2 (two) times daily. Start the day before Taxotere. Then again the day after chemo for 3 days.  30 tablet  1  .  hydrocortisone (ANUSOL-HC) 25 MG suppository Place 1 suppository (25 mg total) rectally 2 (two) times daily.  12 suppository  1  . lidocaine-prilocaine (EMLA) cream Apply 1 application topically as needed. Apply over port site 1-2 hours before chemo, then cove with plastic wrap  30 g  0  . loratadine (CLARITIN) 10 MG tablet Take 10 mg by mouth daily as needed for allergies.      Marland Kitchen LORazepam (ATIVAN) 0.5 MG tablet Take 1 tablet (0.5 mg total) by mouth at bedtime as needed (Nausea or vomiting).  30 tablet  0  . Multiple Vitamins-Minerals (MULTI ADULT GUMMIES PO) Take 2 each by mouth daily.      . ondansetron (ZOFRAN) 8 MG tablet Take 1 tablet (8 mg total) by mouth 2 (two) times daily. Start the day after chemo for 3 days. Then take as needed for nausea or vomiting.  30 tablet  1  . oxyCODONE-acetaminophen (ROXICET) 5-325 MG per tablet Take 1 tablet by mouth every 4 (four) hours as needed.  30 tablet  0  . prochlorperazine (COMPAZINE) 10 MG tablet  Take 1 tablet (10 mg total) by mouth every 6 (six) hours as needed (Nausea or vomiting).  30 tablet  1  . UNABLE TO FIND Apply 1 each topically once. Med Name: Dispense per medical necessity cranial prothesis for this patient with alopecia secondary to chemotherapy due to breast cancer history.  1 each  0   No current facility-administered medications for this visit.   Facility-Administered Medications Ordered in Other Visits  Medication Dose Route Frequency Provider Last Rate Last Dose  . CARBOplatin (PARAPLATIN) 750 mg in sodium chloride 0.9 % 250 mL chemo infusion  750 mg Intravenous Once Chauncey Cruel, MD      . DOCEtaxel (TAXOTERE) 190 mg in dextrose 5 % 500 mL chemo infusion  75 mg/m2 (Treatment Plan Actual) Intravenous Once Chauncey Cruel, MD      . heparin lock flush 100 unit/mL  500 Units Intracatheter Once PRN Chauncey Cruel, MD      . ondansetron (ZOFRAN) IVPB 16 mg  16 mg Intravenous Once Chauncey Cruel, MD   16 mg at 10/19/13 1034    . sodium chloride 0.9 % injection 10 mL  10 mL Intracatheter PRN Chauncey Cruel, MD      . trastuzumab (HERCEPTIN) 861 mg in sodium chloride 0.9 % 250 mL chemo infusion  6 mg/kg (Treatment Plan Actual) Intravenous Once Chauncey Cruel, MD        OBJECTIVE: Middle-aged [place] American woman who appears stated age 43 Vitals:   10/19/13 0913  BP: 126/69  Pulse: 86  Temp: 98.5 F (36.9 C)  Resp: 20     Body mass index is 51.35 kg/(m^2).     ECOG FS:1 - Symptomatic but completely ambulatory  Nail beds hyperpigmented  Sclerae unicteric, pupils equal and reactive Oropharynx clear and moist-- no thrush No cervical or supraclavicular adenopathy Lungs no rales or rhonchi Heart regular rate and rhythm Abd soft, nontender, positive bowel sounds MSK no [name] spinal tenderness, no upper extremity lymphedema Neuro: nonfocal, well oriented, appropriate affect Breasts: deferred  LAB RESULTS:  CMP     Component Value Date/Time   NA 140 10/19/2013 0831   K 4.2 10/19/2013 0831   CO2 23 10/19/2013 0831   GLUCOSE 125 10/19/2013 0831   BUN 12.7 10/19/2013 0831   CREATININE 0.8 10/19/2013 0831   CALCIUM 9.8 10/19/2013 0831   PROT 7.4 10/19/2013 0831   ALBUMIN 3.4* 10/19/2013 0831   AST 15 10/19/2013 0831   ALT 28 10/19/2013 0831   ALKPHOS 99 10/19/2013 0831   BILITOT 0.21 10/19/2013 0831    I No results found for this basename: SPEP,  UPEP,   kappa and lambda light chains    Lab Results  Component Value Date   WBC 9.3 10/19/2013   NEUTROABS 7.4* 10/19/2013   HGB 11.1* 10/19/2013   HCT 34.5* 10/19/2013   MCV 83.1 10/19/2013   PLT 374 10/19/2013      Chemistry      Component Value Date/Time   NA 140 10/19/2013 0831   K 4.2 10/19/2013 0831   CO2 23 10/19/2013 0831   BUN 12.7 10/19/2013 0831   CREATININE 0.8 10/19/2013 0831      Component Value Date/Time   CALCIUM 9.8 10/19/2013 0831   ALKPHOS 99 10/19/2013 0831   AST 15 10/19/2013 0831   ALT 28 10/19/2013 0831   BILITOT 0.21 10/19/2013  0831       No results found for this basename: LABCA2    No  components found with this basename: LABCA125    No results found for this basename: INR,  in the last 168 hours  Urinalysis No results found for this basename: colorurine,  appearanceur,  labspec,  phurine,  glucoseu,  hgbur,  bilirubinur,  ketonesur,  proteinur,  urobilinogen,  nitrite,  leukocytesur    STUDIES: No results found.  ASSESSMENT: 43 y.o. [place] woman status post right breast upper outer quadrant biopsy 08/08/2013 for a triple positive invasive ductal carcinoma, grade 2, with an MIB-1 of 52%, and a suspicious lymph node biopsied at the same time pathologically benign.  (1) genetics testing pending  (2) neoadjuvant chemotherapy to consist of carboplatin, docetaxel, trastuzumab and pertuzumab for 6 cycles, starting 09/07/2013, pertuzumab removed after heavy diarrhea after cycle 1  (3) Echocardiogram on 08/25/13 demonstrated a LVEF of 60-65%.    PLAN: [name] is doing well today. The labs were reviewed in detail and were stable. Her glucose today was 125 and she was pleased to hear this. She will proceed with day 1, cycle 3 today and neulasta tomorrow. Next week she will return for her day 8 visit.  She will restart taking the lorazepam at night for insomnia.   [name] will call to schedule her MRI near the end of October for her midpoint scans, the orders have already been placed. She will meet with Dr. Jana Hakim before the start of cycle 4 to review these scans and make the necessary adjustments. [name] understands and agrees with this plan. She has been encouraged to call with any issues that might arise before her next visit here.   Marcelino Duster, NP 10/19/2013 10:37 AM

## 2013-10-19 NOTE — Patient Instructions (Signed)
[facility] Discharge Instructions for Patients Receiving Chemotherapy  Today you received the following chemotherapy agents Herceptin/Taxotere/Carboplatin To help prevent nausea and vomiting after your treatment, we encourage you to take your nausea medication as prescribed. If you develop nausea and vomiting that is not controlled by your nausea medication, call the clinic.   BELOW ARE SYMPTOMS THAT SHOULD BE REPORTED IMMEDIATELY:  *FEVER GREATER THAN 100.5 F  *CHILLS WITH OR WITHOUT FEVER  NAUSEA AND VOMITING THAT IS NOT CONTROLLED WITH YOUR NAUSEA MEDICATION  *UNUSUAL SHORTNESS OF BREATH  *UNUSUAL BRUISING OR BLEEDING  TENDERNESS IN MOUTH AND THROAT WITH OR WITHOUT PRESENCE OF ULCERS  *URINARY PROBLEMS  *BOWEL PROBLEMS  UNUSUAL RASH Items with * indicate a potential emergency and should be followed up as soon as possible.  Feel free to call the clinic you have any questions or concerns. The clinic phone number is [phone].

## 2013-10-20 ENCOUNTER — Ambulatory Visit (HOSPITAL_BASED_OUTPATIENT_CLINIC_OR_DEPARTMENT_OTHER): Payer: 59

## 2013-10-20 VITALS — BP 123/67 | HR 87 | Temp 98.4°F

## 2013-10-20 DIAGNOSIS — C50919 Malignant neoplasm of unspecified site of unspecified female breast: Secondary | ICD-10-CM

## 2013-10-20 DIAGNOSIS — Z5189 Encounter for other specified aftercare: Secondary | ICD-10-CM

## 2013-10-20 DIAGNOSIS — C50411 Malignant neoplasm of upper-outer quadrant of right female breast: Secondary | ICD-10-CM

## 2013-10-20 MED ORDER — PEGFILGRASTIM INJECTION 6 MG/0.6ML
6.0000 mg | Freq: Once | SUBCUTANEOUS | Status: AC
  Administered 2013-10-20: 6 mg via SUBCUTANEOUS
  Filled 2013-10-20: qty 0.6

## 2013-10-23 ENCOUNTER — Other Ambulatory Visit: Payer: Self-pay | Admitting: Oncology

## 2013-10-23 DIAGNOSIS — C50411 Malignant neoplasm of upper-outer quadrant of right female breast: Secondary | ICD-10-CM

## 2013-10-23 DIAGNOSIS — C50911 Malignant neoplasm of unspecified site of right female breast: Secondary | ICD-10-CM

## 2013-10-26 ENCOUNTER — Encounter: Payer: Self-pay | Admitting: Nurse Practitioner

## 2013-10-26 ENCOUNTER — Telehealth: Payer: Self-pay | Admitting: *Deleted

## 2013-10-26 ENCOUNTER — Other Ambulatory Visit (HOSPITAL_BASED_OUTPATIENT_CLINIC_OR_DEPARTMENT_OTHER): Payer: 59

## 2013-10-26 ENCOUNTER — Ambulatory Visit (HOSPITAL_BASED_OUTPATIENT_CLINIC_OR_DEPARTMENT_OTHER): Payer: 59 | Admitting: Nurse Practitioner

## 2013-10-26 ENCOUNTER — Telehealth: Payer: Self-pay | Admitting: Nurse Practitioner

## 2013-10-26 VITALS — BP 101/66 | HR 105 | Temp 98.6°F | Resp 18 | Ht 65.0 in | Wt 302.6 lb

## 2013-10-26 DIAGNOSIS — R198 Other specified symptoms and signs involving the digestive system and abdomen: Secondary | ICD-10-CM

## 2013-10-26 DIAGNOSIS — Z170 Estrogen receptor positive status [ER+]: Secondary | ICD-10-CM

## 2013-10-26 DIAGNOSIS — R110 Nausea: Secondary | ICD-10-CM

## 2013-10-26 DIAGNOSIS — T451X5A Adverse effect of antineoplastic and immunosuppressive drugs, initial encounter: Secondary | ICD-10-CM

## 2013-10-26 DIAGNOSIS — C50411 Malignant neoplasm of upper-outer quadrant of right female breast: Secondary | ICD-10-CM

## 2013-10-26 LAB — CBC WITH DIFFERENTIAL/PLATELET
BASO%: 0.4 % (ref 0.0–2.0)
Basophils Absolute: 0.1 10*3/uL (ref 0.0–0.1)
EOS%: 0.1 % (ref 0.0–7.0)
Eosinophils Absolute: 0 10*3/uL (ref 0.0–0.5)
HCT: 36.9 % (ref 34.8–46.6)
HGB: 11.4 g/dL — ABNORMAL LOW (ref 11.6–15.9)
LYMPH#: 6.1 10*3/uL — AB (ref 0.9–3.3)
LYMPH%: 27.9 % (ref 14.0–49.7)
MCH: 26.1 pg (ref 25.1–34.0)
MCHC: 31 g/dL — ABNORMAL LOW (ref 31.5–36.0)
MCV: 83.9 fL (ref 79.5–101.0)
MONO#: 1.9 10*3/uL — ABNORMAL HIGH (ref 0.1–0.9)
MONO%: 8.8 % (ref 0.0–14.0)
NEUT#: 13.8 10*3/uL — ABNORMAL HIGH (ref 1.5–6.5)
NEUT%: 62.8 % (ref 38.4–76.8)
Platelets: 276 10*3/uL (ref 145–400)
RBC: 4.39 10*6/uL (ref 3.70–5.45)
RDW: 17.3 % — AB (ref 11.2–14.5)
WBC: 22 10*3/uL — ABNORMAL HIGH (ref 3.9–10.3)

## 2013-10-26 LAB — COMPREHENSIVE METABOLIC PANEL (CC13)
ALBUMIN: 3.4 g/dL — AB (ref 3.5–5.0)
ALK PHOS: 129 U/L (ref 40–150)
ALT: 22 U/L (ref 0–55)
AST: 16 U/L (ref 5–34)
Anion Gap: 10 mEq/L (ref 3–11)
BUN: 17 mg/dL (ref 7.0–26.0)
CO2: 23 mEq/L (ref 22–29)
Calcium: 9 mg/dL (ref 8.4–10.4)
Chloride: 104 mEq/L (ref 98–109)
Creatinine: 1.1 mg/dL (ref 0.6–1.1)
Glucose: 108 mg/dl (ref 70–140)
POTASSIUM: 3.7 meq/L (ref 3.5–5.1)
SODIUM: 137 meq/L (ref 136–145)
TOTAL PROTEIN: 6.9 g/dL (ref 6.4–8.3)
Total Bilirubin: <0.2 mg/dL (ref 0.20–1.20)

## 2013-10-26 NOTE — Progress Notes (Signed)
[name]  Telephone:(336) [phone] Fax:(336) [phone]     ID: [name] DOB: Jan 31, 1970  MR#: [id number]  ATF#:[id number]  Patient Care Team: Linda Pel, MD as PCP - General (Internal Medicine) Linda Luna, MD as Consulting Physician (General Surgery) Linda Cruel, MD as Consulting Physician (Oncology) Linda Katz, MD as Consulting Physician (Obstetrics and Gynecology)  CHIEF COMPLAINT: Newly diagnosed triple positive breast cancer  CURRENT TREATMENT: neoadjuvant chemotherapy  BREAST CANCER HISTORY: From the original intake note:  The patient herself palpated a mass in her right breast mid July. She brought it to Dr. Sherlynn [name] attention and he set the patient up for bilateral diagnostic mammography and right breast ultrasonography of the breast Center 07/28/2013. Right mammogram showed an irregular mass in the upper outer quadrant measuring 1.4 cm. There was some associated pleomorphic microcalcifications. This was palpable, at the 10:30 position 11 cm from the nipple. By palpation it measured approximately 1.5 cm. Ultrasound confirmed an hypoechoic mass with irregular borders measuring 1.1 cm. The right axilla showed a few adjacent deep lymph nodes with mild cortical thickening.  On 08/08/2013 the patient underwent biopsy of the breast mass in question. The pathology (SAA [id number]) showed an invasive ductal carcinoma, grade 2, estrogen receptor 49% positive, progesterone receptor 47% positive, both with moderate staining intensity, with an MIB-1 of 52%, and HER-2 amplification with a signals ratio of 8.1. The right axillary lymph node biopsied at the same time was benign this was felt to be possibly concordant.  And 08/15/2013 the patient underwent bilateral breast MRI showing a 2 cm irregular enhancing mass in the upper outer quadrant of the right breast. There were no additional changes no abnormal appearing lymph nodes and no masses or  abnormalities in the left breast.  The patient's subsequent history is as detailed below  INTERVAL HISTORY: [name] returns today for follow up of her breast cancer, accompanied by her mother. Today is day 8, cycle 3 of taxotere, carboplatin, and herceptin, with neulasta on day 2 for graulocyte support. The perjeta was removed because of intense diarrhea issues early on in treatment. [name] continues to struggle with intermittent diarrhea. She didn't have a bowel movement at all Sunday through Tuesday, then suddenly had 5 in one day. She uses imodium PRN as instructed and this helps. She has questran powder available, but does not use this often for fear of constipation. She has not had a bowel movement today. She vomited once this past week, but generally compazine keeps her nausea at bay. She tries to keep hydrated with at least 48-64oz of water per day. Her appetite is decreased because "nothing tastes good." Presently she is lying on the exam table because she is fatigued. She finds herself sleeping more.   REVIEW OF SYSTEMS: [name] denies fevers or chills. She has no rash, mouth sores, or peripheral neuropathy symptoms. She denies shortness of breath, chest pain, palpitations, or cough. She has some fatigue, but mainly has trouble sleeping on the days that she is taking dexamethasone. She takes lorazepam PRN for sleep. She has rid herself of concentrated sweets in order to manage her blood sugar. A detailed review of systems is otherwise stable at this time.   PAST MEDICAL HISTORY: Past Medical History  Diagnosis Date  . Anxiety   . Wears contact lenses   . Snores   . Malignant neoplasm of breast (female), unspecified site     PAST SURGICAL HISTORY: Past Surgical History  Procedure Laterality Date  .  Wisdom tooth extraction    . Portacath placement N/A 08/23/2013    Procedure: INSERTION PORT-A-CATH WITH ULTRA SOUND;  Surgeon: Linda Faster. Cornett, MD;  Location: [facility];   Service: General;  Laterality: N/A;    FAMILY HISTORY Family History  Problem Relation Age of Onset  . Uterine cancer Mother 30    TAH/BSO  . Colon cancer Father 87    deceased 34  . Leukemia Maternal Grandmother     deceased 38  . Skin cancer Paternal Grandmother   . Prostate cancer Other    the patient's father died from colon cancer the age of 97. It had been diagnosed 3 years prior. The patient's mother was diagnosed with cancer of the uterus at age 25. She is 44 years old as of August 2015. The patient had no brothers or sisters. There is a history of prostate cancer leukemia at in the family as well as melanoma, but there is no history of breast or ovarian cancer in the family.  GYNECOLOGIC HISTORY:  No LMP recorded. Menarche age 21. The patient is GX P0. She is still having regular periods. 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 She lives alone, with no pets.    ADVANCED DIRECTIVES: Not in place. At her 08/16/2013 visit the patient was given the upper. Documents 2 complete and notarize so she may name her mother, [name] [name] as her healthcare power of attorney, which is the patient's intention. [name] can be reached at [phone]. She currently lives in [place] but is planning to move to [place]: History  Substance Use Topics  . Smoking status: Former Smoker    Quit date: 02/20/2013  . Smokeless tobacco: Not on file  . Alcohol Use: Yes     Comment: 1/week     Colonoscopy: Never  PAP: July 2015 Bone density: Never  Lipid panel:  Allergies  Allergen Reactions  . Amoxicillin Rash    Current Outpatient Prescriptions  Medication Sig Dispense Refill  . acetaminophen (TYLENOL) 160 MG/5ML liquid Take 20.3 mLs (650 mg total) by mouth once.  120 mL  0  . Calcium-Vitamin D-Vitamin K (CALCIUM SOFT CHEWS PO) Take by  mouth daily.      . cholestyramine (QUESTRAN) 4 G packet Take 1 packet (4 g total) by mouth 3 (three) times daily with meals.  60 each  12  . dexamethasone (DECADRON) 4 MG tablet Take 2 tablets (8 mg total) by mouth 2 (two) times daily. Start the day before Taxotere. Then again the day after chemo for 3 days.  30 tablet  1  . hydrocortisone (ANUSOL-HC) 25 MG suppository Place 1 suppository (25 mg total) rectally 2 (two) times daily.  12 suppository  1  . lidocaine-prilocaine (EMLA) cream Apply 1 application topically as needed. Apply over port site 1-2 hours before chemo, then cove with plastic wrap  30 g  0  . loratadine (CLARITIN) 10 MG tablet Take 10 mg by mouth daily as needed for allergies.      Marland Kitchen LORazepam (ATIVAN) 0.5 MG tablet TAKE 1 TABLET BY MOUTH AT BEDTIME AS NEEDED (NAUSEA OR VOMITING)  30 tablet  0  . Multiple Vitamins-Minerals (MULTI ADULT GUMMIES PO) Take 2 each by mouth daily.      . ondansetron (ZOFRAN) 8 MG tablet Take 1 tablet (8 mg total) by  mouth 2 (two) times daily. Start the day after chemo for 3 days. Then take as needed for nausea or vomiting.  30 tablet  1  . oxyCODONE-acetaminophen (ROXICET) 5-325 MG per tablet Take 1 tablet by mouth every 4 (four) hours as needed.  30 tablet  0  . prochlorperazine (COMPAZINE) 10 MG tablet Take 1 tablet (10 mg total) by mouth every 6 (six) hours as needed (Nausea or vomiting).  30 tablet  1  . UNABLE TO FIND Apply 1 each topically once. Med Name: Dispense per medical necessity cranial prothesis for this patient with alopecia secondary to chemotherapy due to breast cancer history.  1 each  0   No current facility-administered medications for this visit.    OBJECTIVE: Middle-aged [place] American woman who appears stated age 43 Vitals:   10/26/13 0913  BP: 101/66  Pulse: 105  Temp:   Resp:      Body mass index is 50.36 kg/(m^2).     ECOG FS:1 - Symptomatic but completely ambulatory  Skin: warm, dry. nail beds hyperpigmented at  bases HEENT: sclerae anicteric, conjunctivae pink, oropharynx clear. No thrush or mucositis.  Lymph Nodes: No cervical or supraclavicular lymphadenopathy  Lungs: clear to auscultation bilaterally, no rales, wheezes, or rhonci  Heart: regular rate and rhythm  Abdomen: obsese, soft, non tender, positive bowel sounds  Musculoskeletal: No [name] spinal tenderness, no peripheral edema  Neuro: non [name], well oriented, positive affect  Breasts: deferred   LAB RESULTS:  CMP     Component Value Date/Time   NA 137 10/26/2013 0844   K 3.7 10/26/2013 0844   CO2 23 10/26/2013 0844   GLUCOSE 108 10/26/2013 0844   BUN 17.0 10/26/2013 0844   CREATININE 1.1 10/26/2013 0844   CALCIUM 9.0 10/26/2013 0844   PROT 6.9 10/26/2013 0844   ALBUMIN 3.4* 10/26/2013 0844   AST 16 10/26/2013 0844   ALT 22 10/26/2013 0844   ALKPHOS 129 10/26/2013 0844   BILITOT <0.20 10/26/2013 0844    I No results found for this basename: SPEP,  UPEP,   kappa and lambda light chains    Lab Results  Component Value Date   WBC 22.0* 10/26/2013   NEUTROABS 13.8* 10/26/2013   HGB 11.4* 10/26/2013   HCT 36.9 10/26/2013   MCV 83.9 10/26/2013   PLT 276 10/26/2013      Chemistry      Component Value Date/Time   NA 137 10/26/2013 0844   K 3.7 10/26/2013 0844   CO2 23 10/26/2013 0844   BUN 17.0 10/26/2013 0844   CREATININE 1.1 10/26/2013 0844      Component Value Date/Time   CALCIUM 9.0 10/26/2013 0844   ALKPHOS 129 10/26/2013 0844   AST 16 10/26/2013 0844   ALT 22 10/26/2013 0844   BILITOT <0.20 10/26/2013 0844       No results found for this basename: LABCA2    No components found with this basename: LABCA125    No results found for this basename: INR,  in the last 168 hours  Urinalysis No results found for this basename: colorurine,  appearanceur,  labspec,  phurine,  glucoseu,  hgbur,  bilirubinur,  ketonesur,  proteinur,  urobilinogen,  nitrite,  leukocytesur    STUDIES: No results  found.  ASSESSMENT: 43 y.o. [place] woman status post right breast upper outer quadrant biopsy 08/08/2013 for a triple positive invasive ductal carcinoma, grade 2, with an MIB-1 of 52%, and a suspicious lymph node biopsied at the same time pathologically  benign.  (1) genetics testing pending  (2) neoadjuvant chemotherapy to consist of carboplatin, docetaxel, trastuzumab and pertuzumab for 6 cycles, starting 09/07/2013, pertuzumab removed after heavy diarrhea after cycle 1  (3) Echocardiogram on 08/25/13 demonstrated a LVEF of 60-65%.    PLAN: [name] is doing ok today. The labs were reviewed in detail and were stable. At present she is having no nausea or diarrhea, and her electrolytes remain WNL. The episodes this week were fleeting, but seem to happen with every cycle at about the same time. She feels like she is managing well.   She is headed for a breast MRI tomorrow for her midpoint scans. Abcde will return in 2 weeks for the start of cycle 4 with Dr. Jana Hakim. She understands and agrees with this plan. She knows the goal of treatment in her case is cure. She has been encouraged to call with any issues that might arise before her next visit here.   Linda Duster, NP 10/26/2013 10:09 AM

## 2013-10-26 NOTE — Telephone Encounter (Signed)
Per staff message and POF I have scheduled appts. Advised scheduler of appts. JMW  

## 2013-10-26 NOTE — Telephone Encounter (Signed)
, °

## 2013-10-27 ENCOUNTER — Ambulatory Visit
Admission: RE | Admit: 2013-10-27 | Discharge: 2013-10-27 | Disposition: A | Discharge status: Home / Self Care | Payer: 59 | Source: Ambulatory Visit | Attending: Oncology | Admitting: Oncology

## 2013-10-27 DIAGNOSIS — C50411 Malignant neoplasm of upper-outer quadrant of right female breast: Secondary | ICD-10-CM

## 2013-10-27 DIAGNOSIS — C50919 Malignant neoplasm of unspecified site of unspecified female breast: Secondary | ICD-10-CM

## 2013-10-27 MED ORDER — GADOBENATE DIMEGLUMINE 529 MG/ML IV SOLN
20.0000 mL | Freq: Once | INTRAVENOUS | Status: AC | PRN
  Administered 2013-10-27: 20 mL via INTRAVENOUS

## 2013-11-01 ENCOUNTER — Telehealth: Payer: Self-pay | Admitting: Oncology

## 2013-11-01 NOTE — Telephone Encounter (Signed)
per vm left pt wanted appt for 11/5-adv no order for 11/5 appt-adv to mention to GM on next appt-pt understood

## 2013-11-09 ENCOUNTER — Other Ambulatory Visit: Payer: Self-pay | Admitting: Oncology

## 2013-11-09 ENCOUNTER — Other Ambulatory Visit (HOSPITAL_BASED_OUTPATIENT_CLINIC_OR_DEPARTMENT_OTHER): Payer: 59

## 2013-11-09 ENCOUNTER — Ambulatory Visit (HOSPITAL_BASED_OUTPATIENT_CLINIC_OR_DEPARTMENT_OTHER): Payer: 59 | Admitting: Oncology

## 2013-11-09 ENCOUNTER — Ambulatory Visit (HOSPITAL_BASED_OUTPATIENT_CLINIC_OR_DEPARTMENT_OTHER): Payer: 59

## 2013-11-09 ENCOUNTER — Telehealth: Payer: Self-pay | Admitting: Oncology

## 2013-11-09 VITALS — BP 128/64 | HR 82 | Temp 98.3°F | Resp 18 | Ht 65.0 in | Wt 313.7 lb

## 2013-11-09 DIAGNOSIS — Z5111 Encounter for antineoplastic chemotherapy: Secondary | ICD-10-CM

## 2013-11-09 DIAGNOSIS — R110 Nausea: Secondary | ICD-10-CM

## 2013-11-09 DIAGNOSIS — C50411 Malignant neoplasm of upper-outer quadrant of right female breast: Secondary | ICD-10-CM

## 2013-11-09 DIAGNOSIS — T451X5A Adverse effect of antineoplastic and immunosuppressive drugs, initial encounter: Secondary | ICD-10-CM

## 2013-11-09 DIAGNOSIS — Z170 Estrogen receptor positive status [ER+]: Secondary | ICD-10-CM

## 2013-11-09 DIAGNOSIS — R739 Hyperglycemia, unspecified: Secondary | ICD-10-CM

## 2013-11-09 DIAGNOSIS — Z5112 Encounter for antineoplastic immunotherapy: Secondary | ICD-10-CM

## 2013-11-09 DIAGNOSIS — T380X5A Adverse effect of glucocorticoids and synthetic analogues, initial encounter: Secondary | ICD-10-CM

## 2013-11-09 LAB — CBC WITH DIFFERENTIAL/PLATELET
BASO%: 0.1 % (ref 0.0–2.0)
Basophils Absolute: 0 10*3/uL (ref 0.0–0.1)
EOS ABS: 0 10*3/uL (ref 0.0–0.5)
EOS%: 0 % (ref 0.0–7.0)
HCT: 34.5 % — ABNORMAL LOW (ref 34.8–46.6)
HGB: 11.1 g/dL — ABNORMAL LOW (ref 11.6–15.9)
LYMPH%: 15.6 % (ref 14.0–49.7)
MCH: 26.9 pg (ref 25.1–34.0)
MCHC: 32.2 g/dL (ref 31.5–36.0)
MCV: 83.7 fL (ref 79.5–101.0)
MONO#: 0.5 10*3/uL (ref 0.1–0.9)
MONO%: 4.3 % (ref 0.0–14.0)
NEUT#: 8.4 10*3/uL — ABNORMAL HIGH (ref 1.5–6.5)
NEUT%: 80 % — AB (ref 38.4–76.8)
PLATELETS: 324 10*3/uL (ref 145–400)
RBC: 4.12 10*6/uL (ref 3.70–5.45)
RDW: 18.8 % — ABNORMAL HIGH (ref 11.2–14.5)
WBC: 10.5 10*3/uL — ABNORMAL HIGH (ref 3.9–10.3)
lymph#: 1.6 10*3/uL (ref 0.9–3.3)

## 2013-11-09 LAB — COMPREHENSIVE METABOLIC PANEL (CC13)
ALBUMIN: 3.4 g/dL — AB (ref 3.5–5.0)
ALT: 24 U/L (ref 0–55)
AST: 12 U/L (ref 5–34)
Alkaline Phosphatase: 98 U/L (ref 40–150)
Anion Gap: 10 mEq/L (ref 3–11)
BUN: 8.9 mg/dL (ref 7.0–26.0)
CALCIUM: 9.9 mg/dL (ref 8.4–10.4)
CHLORIDE: 108 meq/L (ref 98–109)
CO2: 22 meq/L (ref 22–29)
Creatinine: 0.8 mg/dL (ref 0.6–1.1)
Glucose: 132 mg/dl (ref 70–140)
POTASSIUM: 4 meq/L (ref 3.5–5.1)
Sodium: 140 mEq/L (ref 136–145)
Total Bilirubin: 0.21 mg/dL (ref 0.20–1.20)
Total Protein: 7.2 g/dL (ref 6.4–8.3)

## 2013-11-09 MED ORDER — DEXAMETHASONE SODIUM PHOSPHATE 20 MG/5ML IJ SOLN
INTRAMUSCULAR | Status: AC
  Filled 2013-11-09: qty 5

## 2013-11-09 MED ORDER — HEPARIN SOD (PORK) LOCK FLUSH 100 UNIT/ML IV SOLN
500.0000 [IU] | Freq: Once | INTRAVENOUS | Status: DC | PRN
  Filled 2013-11-09: qty 5

## 2013-11-09 MED ORDER — DIPHENHYDRAMINE HCL 25 MG PO CAPS
25.0000 mg | ORAL_CAPSULE | Freq: Once | ORAL | Status: AC
  Administered 2013-11-09: 25 mg via ORAL

## 2013-11-09 MED ORDER — SODIUM CHLORIDE 0.9 % IJ SOLN
10.0000 mL | INTRAMUSCULAR | Status: DC | PRN
  Filled 2013-11-09: qty 10

## 2013-11-09 MED ORDER — HEPARIN SOD (PORK) LOCK FLUSH 100 UNIT/ML IV SOLN
500.0000 [IU] | Freq: Once | INTRAVENOUS | Status: AC | PRN
  Administered 2013-11-09: 500 [IU]
  Filled 2013-11-09: qty 5

## 2013-11-09 MED ORDER — ACETAMINOPHEN 325 MG PO TABS
650.0000 mg | ORAL_TABLET | Freq: Once | ORAL | Status: AC
  Administered 2013-11-09: 650 mg via ORAL

## 2013-11-09 MED ORDER — ACETAMINOPHEN 160 MG/5ML PO SOLN
650.0000 mg | Freq: Once | ORAL | Status: DC
  Filled 2013-11-09: qty 20.3

## 2013-11-09 MED ORDER — ONDANSETRON 16 MG/50ML IVPB (CHCC)
16.0000 mg | Freq: Once | INTRAVENOUS | Status: AC
  Administered 2013-11-09: 16 mg via INTRAVENOUS

## 2013-11-09 MED ORDER — TRASTUZUMAB CHEMO INJECTION 440 MG
6.0000 mg/kg | Freq: Once | INTRAVENOUS | Status: AC
  Administered 2013-11-09: 861 mg via INTRAVENOUS
  Filled 2013-11-09: qty 41

## 2013-11-09 MED ORDER — ACETAMINOPHEN 325 MG PO TABS
ORAL_TABLET | ORAL | Status: AC
  Filled 2013-11-09: qty 2

## 2013-11-09 MED ORDER — SODIUM CHLORIDE 0.9 % IV SOLN
Freq: Once | INTRAVENOUS | Status: AC
  Administered 2013-11-09: 15:00:00 via INTRAVENOUS

## 2013-11-09 MED ORDER — DOCETAXEL CHEMO INJECTION 160 MG/16ML
75.0000 mg/m2 | Freq: Once | INTRAVENOUS | Status: AC
  Administered 2013-11-09: 190 mg via INTRAVENOUS
  Filled 2013-11-09: qty 19

## 2013-11-09 MED ORDER — SODIUM CHLORIDE 0.9 % IJ SOLN
10.0000 mL | INTRAMUSCULAR | Status: DC | PRN
  Administered 2013-11-09: 10 mL
  Filled 2013-11-09: qty 10

## 2013-11-09 MED ORDER — SODIUM CHLORIDE 0.9 % IV SOLN
750.0000 mg | Freq: Once | INTRAVENOUS | Status: AC
  Administered 2013-11-09: 750 mg via INTRAVENOUS
  Filled 2013-11-09: qty 75

## 2013-11-09 MED ORDER — DIPHENHYDRAMINE HCL 25 MG PO CAPS
ORAL_CAPSULE | ORAL | Status: AC
  Filled 2013-11-09: qty 1

## 2013-11-09 MED ORDER — ONDANSETRON 16 MG/50ML IVPB (CHCC)
INTRAVENOUS | Status: AC
  Filled 2013-11-09: qty 16

## 2013-11-09 MED ORDER — DEXAMETHASONE SODIUM PHOSPHATE 20 MG/5ML IJ SOLN
20.0000 mg | Freq: Once | INTRAMUSCULAR | Status: AC
Start: 2013-11-09 — End: 2013-11-09
  Administered 2013-11-09: 20 mg via INTRAVENOUS

## 2013-11-09 NOTE — Telephone Encounter (Signed)
, °

## 2013-11-09 NOTE — Patient Instructions (Signed)
[facility] Discharge Instructions for Patients Receiving Chemotherapy  Today you received the following chemotherapy agents: Herceptin, Taxotere, Carboplatin.  To help prevent nausea and vomiting after your treatment, we encourage you to take your nausea medication: Compazine 10mg  every 6 hours, Zofran 8 mg every 12 hours. As needed/directed.   If you develop nausea and vomiting that is not controlled by your nausea medication, call the clinic.   BELOW ARE SYMPTOMS THAT SHOULD BE REPORTED IMMEDIATELY:  *FEVER GREATER THAN 100.5 F  *CHILLS WITH OR WITHOUT FEVER  NAUSEA AND VOMITING THAT IS NOT CONTROLLED WITH YOUR NAUSEA MEDICATION  *UNUSUAL SHORTNESS OF BREATH  *UNUSUAL BRUISING OR BLEEDING  TENDERNESS IN MOUTH AND THROAT WITH OR WITHOUT PRESENCE OF ULCERS  *URINARY PROBLEMS  *BOWEL PROBLEMS  UNUSUAL RASH Items with * indicate a potential emergency and should be followed up as soon as possible.  Feel free to call the clinic you have any questions or concerns. The clinic phone number is [phone].

## 2013-11-09 NOTE — Progress Notes (Signed)
[name]  [phone] [phone]     ID: [name] DOB: 05-10-1970  MR#: [id number]  NFA#:[id number]  Patient Care Team: Horatio Pel, MD as PCP - General (Internal Medicine) Erroll Luna, MD as Consulting Physician (General Surgery) Chauncey Cruel, MD as Consulting Physician (Oncology) Allena Katz, MD as Consulting Physician (Obstetrics and Gynecology)  CHIEF COMPLAINT: Newly diagnosed triple positive breast cancer  CURRENT TREATMENT: neoadjuvant chemotherapy  BREAST CANCER HISTORY: From the original intake note:  The patient herself palpated a mass in her right breast mid July. She brought it to Dr. Sherlynn Stalls attention and he set the patient up for bilateral diagnostic mammography and right breast ultrasonography of the breast Center 07/28/2013. Right mammogram showed an irregular mass in the upper outer quadrant measuring 1.4 cm. There was some associated pleomorphic microcalcifications. This was palpable, at the 10:30 position 11 cm from the nipple. By palpation it measured approximately 1.5 cm. Ultrasound confirmed an hypoechoic mass with irregular borders measuring 1.1 cm. The right axilla showed a few adjacent deep lymph nodes with mild cortical thickening.  On 08/08/2013 the patient underwent biopsy of the breast mass in question. The pathology ([id number]) showed an invasive ductal carcinoma, grade 2, estrogen receptor 49% positive, progesterone receptor 47% positive, both with moderate staining intensity, with an MIB-1 of 52%, and HER-2 amplification with a signals ratio of 8.1. The right axillary lymph node biopsied at the same time was benign this was felt to be possibly concordant.  And 08/15/2013 the patient underwent bilateral breast MRI showing a 2 cm irregular enhancing mass in the upper outer quadrant of the right breast. There were no additional changes no abnormal appearing lymph nodes and no masses or  abnormalities in the left breast.  The patient's subsequent history is as detailed below  INTERVAL HISTORY: [name] returns today for follow up of her breast cancer, accompanied by her mother. Today is day 1, cycle 4 of taxotere, carboplatin, and herceptin, with neulasta on day 2 for graulocyte support. The perjeta was removed because of intense diarrhea issues early on in treatment.   REVIEW OF SYSTEMS: [name] generally does well on the day of treatment. On the second day she loses her sense of taste and that goes on for about a week. On day 5 she develops diarrhea. She can have up to 5 bowel movements daily. They are not as liquid or as numerous as when she was on the pertuzumab. 2 or 3 days later PRETTY much gone. She then has a good week or 10 days. She has had no neuropathy but she does have numbness in the back of her left heel. She denies any trauma to that in the past. She has decreased her steroids to only a 1 tablet twice daily because of concerns regarding her sugars. She is also being careful with her diet. A detailed review of systems today was otherwise stable.  PAST MEDICAL HISTORY: Past Medical History  Diagnosis Date  . Anxiety   . Wears contact lenses   . Snores   . Malignant neoplasm of breast (female), unspecified site     PAST SURGICAL HISTORY: Past Surgical History  Procedure Laterality Date  . Wisdom tooth extraction    . Portacath placement N/A 08/23/2013    Procedure: INSERTION PORT-A-CATH WITH ULTRA SOUND;  Surgeon: Joyice Faster. Cornett, MD;  Location: [facility];  Service: General;  Laterality: N/A;    FAMILY HISTORY Family History  Problem Relation  Age of Onset  . Uterine cancer Mother 58    TAH/BSO  . Colon cancer Father 61    deceased 59  . Leukemia Maternal Grandmother     deceased 27  . Skin cancer Paternal Grandmother   . Prostate cancer Other    the patient's father died from colon cancer the age of 73. It had been diagnosed 3 years  prior. The patient's mother was diagnosed with cancer of the uterus at age 11. She is 44 years old as of August 2015. The patient had no brothers or sisters. There is a history of prostate cancer leukemia at in the family as well as melanoma, but there is no history of breast or ovarian cancer in the family.  GYNECOLOGIC HISTORY:  Patient's last menstrual period was 10/01/2013. Menarche age 56. The patient is GX P0. She is still having regular periods. 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 She lives alone, with no pets.    ADVANCED DIRECTIVES: Not in place. At her 08/16/2013 visit the patient was given the upper. Documents 2 complete and notarize so she may name her mother, [name] focal as her healthcare power of attorney, which is the patient's intention. Ms. focal can be reached at [phone]. She currently lives in [place] but is planning to move to [place]: History  Substance Use Topics  . Smoking status: Former Smoker    Quit date: 02/20/2013  . Smokeless tobacco: Not on file  . Alcohol Use: Yes     Comment: 1/week     Colonoscopy: Never  PAP: July 2015 Bone density: Never  Lipid panel:  Allergies  Allergen Reactions  . Amoxicillin Rash    Current Outpatient Prescriptions  Medication Sig Dispense Refill  . acetaminophen (TYLENOL) 160 MG/5ML liquid Take 20.3 mLs (650 mg total) by mouth once.  120 mL  0  . Calcium-Vitamin D-Vitamin K (CALCIUM SOFT CHEWS PO) Take by mouth daily.      . cholestyramine (QUESTRAN) 4 G packet Take 1 packet (4 g total) by mouth 3 (three) times daily with meals.  60 each  12  . dexamethasone (DECADRON) 4 MG tablet Take 2 tablets (8 mg total) by mouth 2 (two) times daily. Start the day before Taxotere. Then again the day after chemo for 3 days.  30 tablet  1  . hydrocortisone (ANUSOL-HC) 25 MG  suppository Place 1 suppository (25 mg total) rectally 2 (two) times daily.  12 suppository  1  . lidocaine-prilocaine (EMLA) cream Apply 1 application topically as needed. Apply over port site 1-2 hours before chemo, then cove with plastic wrap  30 g  0  . loratadine (CLARITIN) 10 MG tablet Take 10 mg by mouth daily as needed for allergies.      Marland Kitchen LORazepam (ATIVAN) 0.5 MG tablet TAKE 1 TABLET BY MOUTH AT BEDTIME AS NEEDED (NAUSEA OR VOMITING)  30 tablet  0  . Multiple Vitamins-Minerals (MULTI ADULT GUMMIES PO) Take 2 each by mouth daily.      . ondansetron (ZOFRAN) 8 MG tablet Take 1 tablet (8 mg total) by mouth 2 (two) times daily. Start the day after chemo for 3 days. Then take as needed for nausea or vomiting.  30 tablet  1  . oxyCODONE-acetaminophen (ROXICET) 5-325 MG per tablet Take 1 tablet by mouth every 4 (four) hours as needed.  30 tablet  0  . prochlorperazine (COMPAZINE) 10 MG tablet Take 1 tablet (10 mg total) by mouth every 6 (six) hours as needed (Nausea or vomiting).  30 tablet  1  . UNABLE TO FIND Apply 1 each topically once. Med Name: Dispense per medical necessity cranial prothesis for this patient with alopecia secondary to chemotherapy due to breast cancer history.  1 each  0   No current facility-administered medications for this visit.    OBJECTIVE: Middle-aged [place] American woman who appears stated age 72 Vitals:   11/09/13 0912  BP: 128/64  Pulse: 82  Temp: 98.3 F (36.8 C)  Resp: 18     Body mass index is 52.2 kg/(m^2).     ECOG FS:1 - Symptomatic but completely ambulatory  Sclerae unicteric, pupils equal and reactive Oropharynx clear and moist-- no thrush No cervical or supraclavicular adenopathy Lungs no rales or rhonchi Heart regular rate and rhythm Abd soft, obese, nontender, positive bowel sounds MSK no focal spinal tenderness, no upper extremity lymphedema Neuro: nonfocal, well oriented, positive affect Breasts: I can feel a little firm area in  the upper outer quadrant of the right breast, which is going to be the breast mass. It feels about 1-1-1/2 cm. It is easily movable. There is no skin involvement. There is no nipple retraction. The right axilla is benign per the left breast is unremarkable   LAB RESULTS:  CMP     Component Value Date/Time   NA 137 10/26/2013 0844   K 3.7 10/26/2013 0844   CO2 23 10/26/2013 0844   GLUCOSE 108 10/26/2013 0844   BUN 17.0 10/26/2013 0844   CREATININE 1.1 10/26/2013 0844   CALCIUM 9.0 10/26/2013 0844   PROT 6.9 10/26/2013 0844   ALBUMIN 3.4* 10/26/2013 0844   AST 16 10/26/2013 0844   ALT 22 10/26/2013 0844   ALKPHOS 129 10/26/2013 0844   BILITOT <0.20 10/26/2013 0844    I No results found for this basename: SPEP,  UPEP,   kappa and lambda light chains    Lab Results  Component Value Date   WBC 10.5* 11/09/2013   NEUTROABS 8.4* 11/09/2013   HGB 11.1* 11/09/2013   HCT 34.5* 11/09/2013   MCV 83.7 11/09/2013   PLT 324 11/09/2013      Chemistry      Component Value Date/Time   NA 137 10/26/2013 0844   K 3.7 10/26/2013 0844   CO2 23 10/26/2013 0844   BUN 17.0 10/26/2013 0844   CREATININE 1.1 10/26/2013 0844      Component Value Date/Time   CALCIUM 9.0 10/26/2013 0844   ALKPHOS 129 10/26/2013 0844   AST 16 10/26/2013 0844   ALT 22 10/26/2013 0844   BILITOT <0.20 10/26/2013 0844       No results found for this basename: LABCA2    No components found with this basename: LABCA125    No results found for this basename: INR,  in the last 168 hours  Urinalysis No results found for this basename: colorurine,  appearanceur,  labspec,  phurine,  glucoseu,  hgbur,  bilirubinur,  ketonesur,  proteinur,  urobilinogen,  nitrite,  leukocytesur    STUDIES: Mr Breast Bilateral W Wo Contrast  10/30/2013   CLINICAL DATA:  43 year old female with biopsy proven invasive mammary carcinoma in the upper-outer quadrant of the right breast. The patient had a right axillary lymph  node biopsied which showed benign lymphoid tissue with no evidence of malignancy. Correlation with sentinel lymph node surgical biopsy  was recommended. Assess response to neoadjuvant chemotherapy.  LABS:  None obtained at the time of imaging.  EXAM: BILATERAL BREAST MRI WITH AND WITHOUT CONTRAST  TECHNIQUE: Multiplanar, multisequence MR images of both breasts were obtained prior to and following the intravenous administration of 32m of MultiHance.  THREE-DIMENSIONAL MR IMAGE RENDERING ON INDEPENDENT WORKSTATION:  Three-dimensional MR images were rendered by post-processing of the original MR data on an independent workstation. The three-dimensional MR images were interpreted, and findings are reported in the following complete MRI report for this study. Three dimensional images were evaluated at the independent DynaCad workstation  COMPARISON:  Mammograms dated 08/08/2013 and 07/28/2013. Prior MRI dated 08/15/2013.  FINDINGS: Breast composition: b.  Scattered fibroglandular tissue.  Background parenchymal enhancement: Mild  Right breast: The irregular enhancing mass in the middle third of the upper outer quadrant of the right breast has decreased in the intensity of enhancement in size. The mass currently measures 2.0 x 1.0 x 0.8 cm. It is associated with a signal void artifact corresponding with the biopsy clip. On the prior MRI dated 08/15/2013 it measured 2.0 x 1.4 x 1.4 cm.  Left breast: No mass or abnormal enhancement.  Lymph nodes: No abnormal appearing lymph nodes.  Ancillary findings:  None.  IMPRESSION: Interval reduction in the size of the irregular enhancing mass in the upper-outer quadrant of the right breast corresponding to response to neoadjuvant chemotherapy.  RECOMMENDATION: Treatment plan.  BI-RADS CATEGORY  6: Known biopsy-proven malignancy.   Electronically Signed   By: DLillia MountainM.D.   On: 10/30/2013 09:51    ASSESSMENT: 43y.o. BRCA negative [place] woman status post right breast upper  outer quadrant biopsy 08/08/2013 for a triple positive invasive ductal carcinoma, grade 2, with an MIB-1 of 52%, and a suspicious lymph node biopsied at the same time pathologically benign.  (1) genetics testing found no mutations in ATM, BARD1, BRCA1, BRCA2, BRIP1, CDH1, CHEK2, MRE11A, MUTYH, NBN, NF1, PALB2, PTEN, RAD50, RAD51C, RAD51D, and TP53  (2) neoadjuvant chemotherapy to consist of carboplatin, docetaxel, trastuzumab and pertuzumab for 6 cycles, starting 09/07/2013,  (a) pertuzumab discontinued after cycle 1 because of severe diarrhea  (3) trastuzumab to continue to complete one year (through August 2016); Echocardiogram on 08/25/13 demonstrated a LVEF of 60-65%.    (4) definitive surgery to follow chemotherapy  (5) adjuvant radiation to follow surgery  (6) anti-estrogens to follow radiation.  PLAN: RShanaviais tolerating the chemotherapy well. She will receive the fourth of 6 planned cycles today. At the midcourse, there has been some evidence of response, which is much more impressive if one actually looks of the MRI as opposed to reading the report. We did review those images today.  I have encouraged her to do some exercise. She does not like swimming, and the problem with her left heel holds her back. Nevertheless if she can start a walking program at this point she would do better long-term.  She will return in one week for follow-up and in 3 weeks for her fifth cycle. We will continue to see her a week after each chemotherapy dose for counts follow-up and side effect troubleshooting. She will complete her chemotherapy December 10. She could if she wishes to have her surgery as early as the last week in December.  RMagabyhas a good understanding of the overall plan. She agrees with it. She knows the goal of treatment in her case is cure. She will call with any problems that may develop before her next  visit here.   Chauncey Cruel, MD 11/09/2013 9:20 AM

## 2013-11-10 ENCOUNTER — Ambulatory Visit (HOSPITAL_BASED_OUTPATIENT_CLINIC_OR_DEPARTMENT_OTHER): Payer: 59

## 2013-11-10 VITALS — BP 129/76 | HR 85 | Temp 96.7°F | Resp 20

## 2013-11-10 DIAGNOSIS — Z5189 Encounter for other specified aftercare: Secondary | ICD-10-CM

## 2013-11-10 DIAGNOSIS — C50411 Malignant neoplasm of upper-outer quadrant of right female breast: Secondary | ICD-10-CM

## 2013-11-10 MED ORDER — PEGFILGRASTIM INJECTION 6 MG/0.6ML
6.0000 mg | Freq: Once | SUBCUTANEOUS | Status: AC
  Administered 2013-11-10: 6 mg via SUBCUTANEOUS
  Filled 2013-11-10: qty 0.6

## 2013-11-16 ENCOUNTER — Other Ambulatory Visit: Payer: Self-pay

## 2013-11-16 ENCOUNTER — Other Ambulatory Visit (HOSPITAL_BASED_OUTPATIENT_CLINIC_OR_DEPARTMENT_OTHER): Payer: 59

## 2013-11-16 ENCOUNTER — Encounter: Payer: Self-pay | Admitting: Nurse Practitioner

## 2013-11-16 ENCOUNTER — Ambulatory Visit (HOSPITAL_BASED_OUTPATIENT_CLINIC_OR_DEPARTMENT_OTHER): Payer: 59 | Admitting: Nurse Practitioner

## 2013-11-16 VITALS — BP 97/61 | HR 110 | Temp 97.9°F | Resp 18 | Ht 65.0 in | Wt 308.1 lb

## 2013-11-16 DIAGNOSIS — C50411 Malignant neoplasm of upper-outer quadrant of right female breast: Secondary | ICD-10-CM

## 2013-11-16 DIAGNOSIS — Z170 Estrogen receptor positive status [ER+]: Secondary | ICD-10-CM

## 2013-11-16 DIAGNOSIS — R000 Tachycardia, unspecified: Secondary | ICD-10-CM

## 2013-11-16 DIAGNOSIS — R002 Palpitations: Secondary | ICD-10-CM

## 2013-11-16 LAB — COMPREHENSIVE METABOLIC PANEL (CC13)
ALT: 22 U/L (ref 0–55)
AST: 13 U/L (ref 5–34)
Albumin: 3.3 g/dL — ABNORMAL LOW (ref 3.5–5.0)
Alkaline Phosphatase: 100 U/L (ref 40–150)
Anion Gap: 9 mEq/L (ref 3–11)
BILIRUBIN TOTAL: 0.28 mg/dL (ref 0.20–1.20)
BUN: 13.5 mg/dL (ref 7.0–26.0)
CO2: 25 mEq/L (ref 22–29)
Calcium: 9.1 mg/dL (ref 8.4–10.4)
Chloride: 102 mEq/L (ref 98–109)
Creatinine: 0.9 mg/dL (ref 0.6–1.1)
GLUCOSE: 98 mg/dL (ref 70–140)
Potassium: 3.9 mEq/L (ref 3.5–5.1)
SODIUM: 136 meq/L (ref 136–145)
TOTAL PROTEIN: 6.4 g/dL (ref 6.4–8.3)

## 2013-11-16 LAB — CBC WITH DIFFERENTIAL/PLATELET
BASO%: 0.3 % (ref 0.0–2.0)
Basophils Absolute: 0 10*3/uL (ref 0.0–0.1)
EOS ABS: 0 10*3/uL (ref 0.0–0.5)
EOS%: 0.2 % (ref 0.0–7.0)
HEMATOCRIT: 34 % — AB (ref 34.8–46.6)
HGB: 10.7 g/dL — ABNORMAL LOW (ref 11.6–15.9)
LYMPH%: 30.6 % (ref 14.0–49.7)
MCH: 26.9 pg (ref 25.1–34.0)
MCHC: 31.4 g/dL — ABNORMAL LOW (ref 31.5–36.0)
MCV: 85.6 fL (ref 79.5–101.0)
MONO#: 1.8 10*3/uL — AB (ref 0.1–0.9)
MONO%: 11.2 % (ref 0.0–14.0)
NEUT%: 57.7 % (ref 38.4–76.8)
NEUTROS ABS: 9.1 10*3/uL — AB (ref 1.5–6.5)
PLATELETS: 165 10*3/uL (ref 145–400)
RBC: 3.97 10*6/uL (ref 3.70–5.45)
RDW: 18.6 % — ABNORMAL HIGH (ref 11.2–14.5)
WBC: 15.8 10*3/uL — AB (ref 3.9–10.3)
lymph#: 4.8 10*3/uL — ABNORMAL HIGH (ref 0.9–3.3)

## 2013-11-16 NOTE — Progress Notes (Signed)
[facility]  [phone] [phone]     ID: [name] DOB: Sep 27, 1970  MR#: [id number]  ENI#:[id number]  Patient Care Team: Horatio Pel, MD as PCP - General (Internal Medicine) Erroll Luna, MD as Consulting Physician (General Surgery) Chauncey Cruel, MD as Consulting Physician (Oncology) Allena Katz, MD as Consulting Physician (Obstetrics and Gynecology)  CHIEF COMPLAINT: Newly diagnosed triple positive breast cancer  CURRENT TREATMENT: neoadjuvant chemotherapy  BREAST CANCER HISTORY: From the original intake note:  The patient herself palpated a mass in her right breast mid July. She brought it to Dr. Sherlynn Stalls attention and he set the patient up for bilateral diagnostic mammography and right breast ultrasonography of the breast Center 07/28/2013. Right mammogram showed an irregular mass in the upper outer quadrant measuring 1.4 cm. There was some associated pleomorphic microcalcifications. This was palpable, at the 10:30 position 11 cm from the nipple. By palpation it measured approximately 1.5 cm. Ultrasound confirmed an hypoechoic mass with irregular borders measuring 1.1 cm. The right axilla showed a few adjacent deep lymph nodes with mild cortical thickening.  On 08/08/2013 the patient underwent biopsy of the breast mass in question. The pathology ([id number]) showed an invasive ductal carcinoma, grade 2, estrogen receptor 49% positive, progesterone receptor 47% positive, both with moderate staining intensity, with an MIB-1 of 52%, and HER-2 amplification with a signals ratio of 8.1. The right axillary lymph node biopsied at the same time was benign this was felt to be possibly concordant.  And 08/15/2013 the patient underwent bilateral breast MRI showing a 2 cm irregular enhancing mass in the upper outer quadrant of the right breast. There were no additional changes no abnormal appearing lymph nodes and no masses or  abnormalities in the left breast.  The patient's subsequent history is as detailed below  INTERVAL HISTORY: [name] returns today for follow up of her breast cancer, accompanied by her mother. Today is day 8, cycle 4 of taxotere, carboplatin, and herceptin, with neulasta on day 2 for graulocyte support. The perjeta was removed because of intense diarrhea issues early on in treatment. At her midcourse evaluation, the MRI showed a decent response to this chemotherapy regimen and she will continue to complete 6 total cycles.   REVIEW OF SYSTEMS: This most recent cycle of TCH proved to be different than the previous 3. She had no diarrhea whatsoever with this round, which routinely occurs on days 5-7. In addition, for the past 3 days she has experienced her "heart racing" for several minutes at a time. They occur at rest and are not associated chest pain or shortness of breath. She stays still until they resolve. She has been staying hydrated and is eating well, denying nausea. She denies headaches, dizziness, mouth sores, or rashes. She has some numbness to the back of left left heel, that is stable. A detailed review of systems is otherwise stable.  PAST MEDICAL HISTORY: Past Medical History  Diagnosis Date  . Anxiety   . Wears contact lenses   . Snores   . Malignant neoplasm of breast (female), unspecified site     PAST SURGICAL HISTORY: Past Surgical History  Procedure Laterality Date  . Wisdom tooth extraction    . Portacath placement N/A 08/23/2013    Procedure: INSERTION PORT-A-CATH WITH ULTRA SOUND;  Surgeon: Joyice Faster. Cornett, MD;  Location: [facility];  Service: General;  Laterality: N/A;    FAMILY HISTORY Family History  Problem Relation Age of Onset  .  Uterine cancer Mother 70    TAH/BSO  . Colon cancer Father 6    deceased 31  . Leukemia Maternal Grandmother     deceased 91  . Skin cancer Paternal Grandmother   . Prostate cancer Other    the patient's  father died from colon cancer the age of 41. It had been diagnosed 3 years prior. The patient's mother was diagnosed with cancer of the uterus at age 19. She is 44 years old as of August 2015. The patient had no brothers or sisters. There is a history of prostate cancer leukemia at in the family as well as melanoma, but there is no history of breast or ovarian cancer in the family.  GYNECOLOGIC HISTORY:  Patient's last menstrual period was 10/01/2013. Menarche age 53. The patient is GX P0. She is still having regular periods. 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 She lives alone, with no pets.    ADVANCED DIRECTIVES: Not in place. At her 08/16/2013 visit the patient was given the upper. Documents 2 complete and notarize so she may name her mother, [name] focal as her healthcare power of attorney, which is the patient's intention. Ms. focal can be reached at [phone]. She currently lives in [place] but is planning to move to [place]: History  Substance Use Topics  . Smoking status: Former Smoker    Quit date: 02/20/2013  . Smokeless tobacco: Not on file  . Alcohol Use: Yes     Comment: 1/week     Colonoscopy: Never  PAP: July 2015 Bone density: Never  Lipid panel:  Allergies  Allergen Reactions  . Amoxicillin Rash    Current Outpatient Prescriptions  Medication Sig Dispense Refill  . acetaminophen (TYLENOL) 160 MG/5ML liquid Take 20.3 mLs (650 mg total) by mouth once. 120 mL 0  . Calcium-Vitamin D-Vitamin K (CALCIUM SOFT CHEWS PO) Take by mouth daily.    . cholestyramine (QUESTRAN) 4 G packet Take 1 packet (4 g total) by mouth 3 (three) times daily with meals. 60 each 12  . dexamethasone (DECADRON) 4 MG tablet Take 2 tablets (8 mg total) by mouth 2 (two) times daily. Start the day before Taxotere. Then again the day after  chemo for 3 days. 30 tablet 1  . hydrocortisone (ANUSOL-HC) 25 MG suppository Place 1 suppository (25 mg total) rectally 2 (two) times daily. 12 suppository 1  . lidocaine-prilocaine (EMLA) cream Apply 1 application topically as needed. Apply over port site 1-2 hours before chemo, then cove with plastic wrap 30 g 0  . loratadine (CLARITIN) 10 MG tablet Take 10 mg by mouth daily as needed for allergies.    Marland Kitchen LORazepam (ATIVAN) 0.5 MG tablet TAKE 1 TABLET BY MOUTH AT BEDTIME AS NEEDED (NAUSEA OR VOMITING) 30 tablet 0  . Multiple Vitamins-Minerals (MULTI ADULT GUMMIES PO) Take 2 each by mouth daily.    . ondansetron (ZOFRAN) 8 MG tablet Take 1 tablet (8 mg total) by mouth 2 (two) times daily. Start the day after chemo for 3 days. Then take as needed for nausea or vomiting. 30 tablet 1  . oxyCODONE-acetaminophen (ROXICET) 5-325 MG per tablet Take 1 tablet by mouth every 4 (four) hours as needed. 30 tablet 0  . prochlorperazine (COMPAZINE) 10 MG tablet Take 1 tablet (10 mg total) by mouth every 6 (six) hours as needed (Nausea  or vomiting). 30 tablet 1  . UNABLE TO FIND Apply 1 each topically once. Med Name: Dispense per medical necessity cranial prothesis for this patient with alopecia secondary to chemotherapy due to breast cancer history. 1 each 0   No current facility-administered medications for this visit.    OBJECTIVE: Middle-aged [place] American woman who appears stated age 89 Vitals:   11/16/13 1336  BP: 97/61  Pulse: 110  Temp: 97.9 F (36.6 C)  Resp: 18     Body mass index is 51.27 kg/(m^2).     ECOG FS:1 - Symptomatic but completely ambulatory  Skin: warm, dry  HEENT: sclerae anicteric, conjunctivae pink, oropharynx clear. No thrush or mucositis.  Lymph Nodes: No cervical or supraclavicular lymphadenopathy  Lungs: clear to auscultation bilaterally, no rales, wheezes, or rhonci  Heart: systolic murmur easily ausculted, likely tachycardic rate Abdomen: round, soft, non tender,  positive bowel sounds  Musculoskeletal: No focal spinal tenderness, no peripheral edema  Neuro: non focal, well oriented, positive affect  Breasts: deferred  LAB RESULTS:  CMP     Component Value Date/Time   NA 136 11/16/2013 1317   K 3.9 11/16/2013 1317   CO2 25 11/16/2013 1317   GLUCOSE 98 11/16/2013 1317   BUN 13.5 11/16/2013 1317   CREATININE 0.9 11/16/2013 1317   CALCIUM 9.1 11/16/2013 1317   PROT 6.4 11/16/2013 1317   ALBUMIN 3.3* 11/16/2013 1317   AST 13 11/16/2013 1317   ALT 22 11/16/2013 1317   ALKPHOS 100 11/16/2013 1317   BILITOT 0.28 11/16/2013 1317    I No results found for: SPEP  Lab Results  Component Value Date   WBC 15.8* 11/16/2013   NEUTROABS 9.1* 11/16/2013   HGB 10.7* 11/16/2013   HCT 34.0* 11/16/2013   MCV 85.6 11/16/2013   PLT 165 11/16/2013      Chemistry      Component Value Date/Time   NA 136 11/16/2013 1317   K 3.9 11/16/2013 1317   CO2 25 11/16/2013 1317   BUN 13.5 11/16/2013 1317   CREATININE 0.9 11/16/2013 1317      Component Value Date/Time   CALCIUM 9.1 11/16/2013 1317   ALKPHOS 100 11/16/2013 1317   AST 13 11/16/2013 1317   ALT 22 11/16/2013 1317   BILITOT 0.28 11/16/2013 1317       No results found for: LABCA2  No components found for: LABCA125  No results for input(s): INR in the last 168 hours.  Urinalysis No results found for: COLORURINE  STUDIES: Mr Breast Bilateral W Wo Contrast  10/30/2013   CLINICAL DATA:  43 year old female with biopsy proven invasive mammary carcinoma in the upper-outer quadrant of the right breast. The patient had a right axillary lymph node biopsied which showed benign lymphoid tissue with no evidence of malignancy. Correlation with sentinel lymph node surgical biopsy was recommended. Assess response to neoadjuvant chemotherapy.  LABS:  None obtained at the time of imaging.  EXAM: BILATERAL BREAST MRI WITH AND WITHOUT CONTRAST  TECHNIQUE: Multiplanar, multisequence MR images of both  breasts were obtained prior to and following the intravenous administration of 58m of MultiHance.  THREE-DIMENSIONAL MR IMAGE RENDERING ON INDEPENDENT WORKSTATION:  Three-dimensional MR images were rendered by post-processing of the original MR data on an independent workstation. The three-dimensional MR images were interpreted, and findings are reported in the following complete MRI report for this study. Three dimensional images were evaluated at the independent DynaCad workstation  COMPARISON:  Mammograms dated 08/08/2013 and 07/28/2013. Prior MRI dated 08/15/2013.  FINDINGS: Breast composition: b.  Scattered fibroglandular tissue.  Background parenchymal enhancement: Mild  Right breast: The irregular enhancing mass in the middle third of the upper outer quadrant of the right breast has decreased in the intensity of enhancement in size. The mass currently measures 2.0 x 1.0 x 0.8 cm. It is associated with a signal void artifact corresponding with the biopsy clip. On the prior MRI dated 08/15/2013 it measured 2.0 x 1.4 x 1.4 cm.  Left breast: No mass or abnormal enhancement.  Lymph nodes: No abnormal appearing lymph nodes.  Ancillary findings:  None.  IMPRESSION: Interval reduction in the size of the irregular enhancing mass in the upper-outer quadrant of the right breast corresponding to response to neoadjuvant chemotherapy.  RECOMMENDATION: Treatment plan.  BI-RADS CATEGORY  6: Known biopsy-proven malignancy.   Electronically Signed   By: Lillia Mountain M.D.   On: 10/30/2013 09:51    ASSESSMENT: 43 y.o. BRCA negative [place] woman status post right breast upper outer quadrant biopsy 08/08/2013 for a triple positive invasive ductal carcinoma, grade 2, with an MIB-1 of 52%, and a suspicious lymph node biopsied at the same time pathologically benign.  (1) genetics testing found no mutations in ATM, BARD1, BRCA1, BRCA2, BRIP1, CDH1, CHEK2, MRE11A, MUTYH, NBN, NF1, PALB2, PTEN, RAD50, RAD51C, RAD51D, and  TP53  (2) neoadjuvant chemotherapy to consist of carboplatin, docetaxel, trastuzumab and pertuzumab for 6 cycles, starting 09/07/2013,  (a) pertuzumab discontinued after cycle 1 because of severe diarrhea  (3) trastuzumab to continue to complete one year (through August 2016); Echocardiogram on 08/25/13 demonstrated a LVEF of 60-65%.    (4) definitive surgery to follow chemotherapy  (5) adjuvant radiation to follow surgery  (6) anti-estrogens to follow radiation.  PLAN: We had an EKG performed in the office after discussing her cardiac symptoms and auscultating a murmur on physical exam. The EKG showed sinus tachycardia, but was a normal exam, otherwise. Her next echocardiogram was originally scheduled for 11/24, but I called and had this moved up to 11/17 in order to have results before she is next due for chemotherapy.  I advised [name] to proceed to the emergency department if her palpitations ever occur with chest pain, shortness of breath, or dizziness. She will return to 2 weeks for labs, and a follow up visit. If the echocardiogram is normal, she will also proceed with her 5th cycle of TCH. [name] understands and agrees with this plan. She knows the goal of treatment in her case is cure.     Marcelino Duster, NP 11/16/2013 4:21 PM

## 2013-11-17 ENCOUNTER — Telehealth: Payer: Self-pay | Admitting: Nurse Practitioner

## 2013-11-17 NOTE — Telephone Encounter (Signed)
S/w pt advised appt chg from 12/17 (APP Pal) to 12/16 @ 11.15am. Pt verbalized understanding and will collect a revised appt calendar at next visit.

## 2013-11-28 ENCOUNTER — Ambulatory Visit (HOSPITAL_BASED_OUTPATIENT_CLINIC_OR_DEPARTMENT_OTHER)
Admission: RE | Admit: 2013-11-28 | Discharge: 2013-11-28 | Disposition: A | Discharge status: Home / Self Care | Payer: 59 | Source: Ambulatory Visit | Attending: Cardiology | Admitting: Cardiology

## 2013-11-28 ENCOUNTER — Encounter (HOSPITAL_COMMUNITY): Payer: Self-pay

## 2013-11-28 ENCOUNTER — Ambulatory Visit (HOSPITAL_COMMUNITY)
Admission: RE | Admit: 2013-11-28 | Discharge: 2013-11-28 | Disposition: A | Discharge status: Home / Self Care | Payer: 59 | Source: Ambulatory Visit | Attending: Internal Medicine | Admitting: Internal Medicine

## 2013-11-28 VITALS — BP 110/78 | HR 104 | Resp 20 | Wt 311.5 lb

## 2013-11-28 DIAGNOSIS — R000 Tachycardia, unspecified: Secondary | ICD-10-CM | POA: Diagnosis not present

## 2013-11-28 DIAGNOSIS — I519 Heart disease, unspecified: Secondary | ICD-10-CM

## 2013-11-28 DIAGNOSIS — Z170 Estrogen receptor positive status [ER+]: Secondary | ICD-10-CM | POA: Diagnosis not present

## 2013-11-28 DIAGNOSIS — R002 Palpitations: Secondary | ICD-10-CM

## 2013-11-28 DIAGNOSIS — Z79899 Other long term (current) drug therapy: Secondary | ICD-10-CM | POA: Diagnosis not present

## 2013-11-28 DIAGNOSIS — C50919 Malignant neoplasm of unspecified site of unspecified female breast: Secondary | ICD-10-CM

## 2013-11-28 DIAGNOSIS — Z87891 Personal history of nicotine dependence: Secondary | ICD-10-CM | POA: Diagnosis not present

## 2013-11-28 DIAGNOSIS — C50411 Malignant neoplasm of upper-outer quadrant of right female breast: Secondary | ICD-10-CM | POA: Insufficient documentation

## 2013-11-28 DIAGNOSIS — R600 Localized edema: Secondary | ICD-10-CM | POA: Diagnosis not present

## 2013-11-28 NOTE — Patient Instructions (Signed)
Your physician recommends that you schedule a follow-up appointment in: 3 month with echocardiogram  

## 2013-11-28 NOTE — Progress Notes (Signed)
  Echocardiogram 2D Echocardiogram has been performed.  [name] 11/28/2013, 3:33 PM

## 2013-11-29 NOTE — Progress Notes (Signed)
Patient ID: [name], female   DOB: 09-25-1970, 43 y.o.   MRN: [id number] PCP: Dr Aggie Hacker, MD as Consulting Physician (General Surgery)  Chauncey Cruel, MD as Consulting Physician (Oncology)  Shon Millet II, MD as Consulting Physician (Obstetrics and Gynecology) Refrring MD: Dr Jana Hakim  HPI: Ms Rhae Hammock is a 43 year old right breast upper outer quadrant biopsy 08/08/2013 for a triple positive invasive ductal carcinoma, grade 2, with an MIB-1 of 52%, and a suspicious lymph node biopsied at the same time pathologically benign referred to cardio onc clininc by Dr Jana Hakim.  She had neoadjuvant chemotherapy with carboplatin, docetaxel, trastuzumab and pertuzumab for 6 cycles, started 09/07/2013.  Now getting Herceptin every 3 wks x 1 year total.   Over she is feeling ok. Denies SOB/Orthopnea. Feels palpitations and feels like HR is elevated for a few days after Herceptin treatment.  Mild sinus tachycardia today.  ECHO 08/2013 EF 60-65% Lat S' 10.4 GLS -21.6% ECHO 11/15 EF 65-70%, lateral S' 16.8, GLS -23.3%  FH: Grandfather- MI x3 Paternal 65 Uncles with CAD.   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 Has occasional alcohol.   Review of Systems: All systems reviewed and negative except as per HPI.   Past Medical History  Diagnosis Date  . Anxiety   . Wears contact lenses   . Snores   . Malignant neoplasm of breast (female), unspecified site     Current Outpatient Prescriptions  Medication Sig Dispense Refill  . acetaminophen (TYLENOL) 160 MG/5ML liquid Take 20.3 mLs (650 mg total) by mouth once. 120 mL 0  . Calcium-Vitamin D-Vitamin K (CALCIUM SOFT CHEWS PO) Take by mouth daily.    . cholestyramine (QUESTRAN) 4 G packet Take 1 packet (4 g total) by mouth 3 (three) times daily with meals. 60 each 12  . dexamethasone (DECADRON) 4 MG tablet Take 2 tablets (8 mg total) by mouth 2 (two) times daily. Start the day before Taxotere. Then again the day after  chemo for 3 days. 30 tablet 1  . hydrocortisone (ANUSOL-HC) 25 MG suppository Place 1 suppository (25 mg total) rectally 2 (two) times daily. 12 suppository 1  . lidocaine-prilocaine (EMLA) cream Apply 1 application topically as needed. Apply over port site 1-2 hours before chemo, then cove with plastic wrap 30 g 0  . loratadine (CLARITIN) 10 MG tablet Take 10 mg by mouth daily as needed for allergies.    Marland Kitchen LORazepam (ATIVAN) 0.5 MG tablet TAKE 1 TABLET BY MOUTH AT BEDTIME AS NEEDED (NAUSEA OR VOMITING) 30 tablet 0  . Multiple Vitamins-Minerals (MULTI ADULT GUMMIES PO) Take 2 each by mouth daily.    . ondansetron (ZOFRAN) 8 MG tablet Take 1 tablet (8 mg total) by mouth 2 (two) times daily. Start the day after chemo for 3 days. Then take as needed for nausea or vomiting. 30 tablet 1  . prochlorperazine (COMPAZINE) 10 MG tablet Take 1 tablet (10 mg total) by mouth every 6 (six) hours as needed (Nausea or vomiting). 30 tablet 1  . UNABLE TO FIND Apply 1 each topically once. Med Name: Dispense per medical necessity cranial prothesis for this patient with alopecia secondary to chemotherapy due to breast cancer history. 1 each 0  . oxyCODONE-acetaminophen (ROXICET) 5-325 MG per tablet Take 1 tablet by mouth every 4 (four) hours as needed. 30 tablet 0   No current facility-administered medications for this encounter.     Allergies  Allergen Reactions  . Amoxicillin Rash  History   Social History  . Marital Status: Single    Spouse Name: N/A    Number of Children: N/A  . Years of Education: N/A   Occupational History  . Not on file.   Social History Main Topics  . Smoking status: Former Smoker    Quit date: 02/20/2013  . Smokeless tobacco: Not on file  . Alcohol Use: Yes     Comment: 1/week  . Drug Use: No  . Sexual Activity: Not on file   Other Topics Concern  . Not on file   Social History Narrative    Family History  Problem Relation Age of Onset  . Uterine cancer Mother  56    TAH/BSO  . Colon cancer Father 105    deceased 76  . Leukemia Maternal Grandmother     deceased 44  . Skin cancer Paternal Grandmother   . Prostate cancer Other     PHYSICAL EXAM: Filed Vitals:   11/28/13 1611  BP: 110/78  Pulse: 104  Resp: 20   General:  Well appearing. No respiratory difficulty HEENT: normal Neck: supple. no JVD. Carotids 2+ bilat; no bruits. No lymphadenopathy or thryomegaly appreciated. Cor: PMI nondisplaced. Regular rate & rhythm. No rubs, gallops or murmurs. Lungs: clear Abdomen: soft, nontender, nondistended. No hepatosplenomegaly. No bruits or masses. Good bowel sounds. Extremities: no cyanosis, clubbing, rash, R and LLL edema. 1+ primarily ankles.  Neuro: alert & oriented x 3, cranial nerves grossly intact. moves all 4 extremities w/o difficulty. Affect pleasant.  ASSESSMENT & PLAN:  1.  R Breast Cancer- Triple + invasive ductal carcinoma. Completed neoadjuvant chemo with carboplatin, docetaxel, and trastuzumab. Intolerant pertuzumab.  Now getting Herceptin x 1 year.  - I reviewed today's echo: EF, strain, lateral S' are all stable.  She will continue Herceptin as planned. - Followup 3 months with echo and office visit.  2. Lower extremity edema: Stable.  3. Palpitations: Intermittent sinus tachycardia primarily around time of getting Herceptin.  May be anxiety response.  Not sure holter would be helpful (likely to show sinus tachycardia) but if symptoms worsen can get.   Loralie Champagne 11/29/2013 10:02 AM

## 2013-11-30 ENCOUNTER — Other Ambulatory Visit (HOSPITAL_BASED_OUTPATIENT_CLINIC_OR_DEPARTMENT_OTHER): Payer: 59

## 2013-11-30 ENCOUNTER — Ambulatory Visit: Payer: 59 | Admitting: Nurse Practitioner

## 2013-11-30 ENCOUNTER — Ambulatory Visit (HOSPITAL_BASED_OUTPATIENT_CLINIC_OR_DEPARTMENT_OTHER): Payer: 59

## 2013-11-30 ENCOUNTER — Ambulatory Visit (HOSPITAL_BASED_OUTPATIENT_CLINIC_OR_DEPARTMENT_OTHER): Payer: 59 | Admitting: Nurse Practitioner

## 2013-11-30 ENCOUNTER — Encounter: Payer: Self-pay | Admitting: Nurse Practitioner

## 2013-11-30 ENCOUNTER — Ambulatory Visit: Payer: 59

## 2013-11-30 ENCOUNTER — Telehealth: Payer: Self-pay | Admitting: Nurse Practitioner

## 2013-11-30 VITALS — BP 124/74 | HR 96 | Temp 98.0°F | Resp 18 | Ht 65.0 in | Wt 316.4 lb

## 2013-11-30 DIAGNOSIS — Z5112 Encounter for antineoplastic immunotherapy: Secondary | ICD-10-CM

## 2013-11-30 DIAGNOSIS — Z170 Estrogen receptor positive status [ER+]: Secondary | ICD-10-CM

## 2013-11-30 DIAGNOSIS — T7840XA Allergy, unspecified, initial encounter: Secondary | ICD-10-CM

## 2013-11-30 DIAGNOSIS — C50411 Malignant neoplasm of upper-outer quadrant of right female breast: Secondary | ICD-10-CM

## 2013-11-30 DIAGNOSIS — Z5111 Encounter for antineoplastic chemotherapy: Secondary | ICD-10-CM

## 2013-11-30 LAB — COMPREHENSIVE METABOLIC PANEL (CC13)
ALBUMIN: 3.4 g/dL — AB (ref 3.5–5.0)
ALK PHOS: 85 U/L (ref 40–150)
ALT: 28 U/L (ref 0–55)
ANION GAP: 8 meq/L (ref 3–11)
AST: 17 U/L (ref 5–34)
BUN: 13.6 mg/dL (ref 7.0–26.0)
CO2: 23 meq/L (ref 22–29)
Calcium: 9.5 mg/dL (ref 8.4–10.4)
Chloride: 107 mEq/L (ref 98–109)
Creatinine: 0.8 mg/dL (ref 0.6–1.1)
Glucose: 101 mg/dl (ref 70–140)
Potassium: 4.1 mEq/L (ref 3.5–5.1)
Sodium: 138 mEq/L (ref 136–145)
Total Protein: 7.1 g/dL (ref 6.4–8.3)

## 2013-11-30 LAB — CBC WITH DIFFERENTIAL/PLATELET
BASO%: 0.1 % (ref 0.0–2.0)
Basophils Absolute: 0 10*3/uL (ref 0.0–0.1)
EOS ABS: 0 10*3/uL (ref 0.0–0.5)
EOS%: 0 % (ref 0.0–7.0)
HEMATOCRIT: 32.5 % — AB (ref 34.8–46.6)
HGB: 10.5 g/dL — ABNORMAL LOW (ref 11.6–15.9)
LYMPH#: 2.3 10*3/uL (ref 0.9–3.3)
LYMPH%: 21.8 % (ref 14.0–49.7)
MCH: 27.4 pg (ref 25.1–34.0)
MCHC: 32.3 g/dL (ref 31.5–36.0)
MCV: 84.9 fL (ref 79.5–101.0)
MONO#: 0.7 10*3/uL (ref 0.1–0.9)
MONO%: 7 % (ref 0.0–14.0)
NEUT#: 7.4 10*3/uL — ABNORMAL HIGH (ref 1.5–6.5)
NEUT%: 71.1 % (ref 38.4–76.8)
Platelets: 374 10*3/uL (ref 145–400)
RBC: 3.83 10*6/uL (ref 3.70–5.45)
RDW: 19.6 % — ABNORMAL HIGH (ref 11.2–14.5)
WBC: 10.3 10*3/uL (ref 3.9–10.3)

## 2013-11-30 MED ORDER — ONDANSETRON 16 MG/50ML IVPB (CHCC)
16.0000 mg | Freq: Once | INTRAVENOUS | Status: AC
  Administered 2013-11-30: 16 mg via INTRAVENOUS

## 2013-11-30 MED ORDER — HEPARIN SOD (PORK) LOCK FLUSH 100 UNIT/ML IV SOLN
500.0000 [IU] | Freq: Once | INTRAVENOUS | Status: AC | PRN
  Administered 2013-11-30: 500 [IU]
  Filled 2013-11-30: qty 5

## 2013-11-30 MED ORDER — DEXAMETHASONE SODIUM PHOSPHATE 20 MG/5ML IJ SOLN
INTRAMUSCULAR | Status: AC
  Filled 2013-11-30: qty 5

## 2013-11-30 MED ORDER — ACETAMINOPHEN 325 MG PO TABS
ORAL_TABLET | ORAL | Status: AC
  Filled 2013-11-30: qty 2

## 2013-11-30 MED ORDER — TRASTUZUMAB CHEMO INJECTION 440 MG
6.0000 mg/kg | Freq: Once | INTRAVENOUS | Status: AC
  Administered 2013-11-30: 861 mg via INTRAVENOUS
  Filled 2013-11-30: qty 41

## 2013-11-30 MED ORDER — SODIUM CHLORIDE 0.9 % IJ SOLN
10.0000 mL | INTRAMUSCULAR | Status: DC | PRN
  Administered 2013-11-30: 10 mL via INTRAVENOUS
  Filled 2013-11-30: qty 10

## 2013-11-30 MED ORDER — DIPHENHYDRAMINE HCL 50 MG/ML IJ SOLN
25.0000 mg | Freq: Once | INTRAMUSCULAR | Status: AC
  Administered 2013-11-30: 25 mg via INTRAVENOUS

## 2013-11-30 MED ORDER — SODIUM CHLORIDE 0.9 % IJ SOLN
10.0000 mL | INTRAMUSCULAR | Status: DC | PRN
  Administered 2013-11-30: 10 mL
  Filled 2013-11-30: qty 10

## 2013-11-30 MED ORDER — METHYLPREDNISOLONE SODIUM SUCC 125 MG IJ SOLR
125.0000 mg | Freq: Once | INTRAMUSCULAR | Status: AC
  Administered 2013-11-30: 125 mg via INTRAVENOUS

## 2013-11-30 MED ORDER — DIPHENHYDRAMINE HCL 25 MG PO CAPS
25.0000 mg | ORAL_CAPSULE | Freq: Once | ORAL | Status: AC
  Administered 2013-11-30: 25 mg via ORAL

## 2013-11-30 MED ORDER — DEXTROSE 5 % IV SOLN
75.0000 mg/m2 | Freq: Once | INTRAVENOUS | Status: AC
  Administered 2013-11-30: 190 mg via INTRAVENOUS
  Filled 2013-11-30: qty 19

## 2013-11-30 MED ORDER — FAMOTIDINE IN NACL 20-0.9 MG/50ML-% IV SOLN
20.0000 mg | Freq: Once | INTRAVENOUS | Status: AC
  Administered 2013-11-30: 20 mg via INTRAVENOUS

## 2013-11-30 MED ORDER — ONDANSETRON 16 MG/50ML IVPB (CHCC)
INTRAVENOUS | Status: AC
  Filled 2013-11-30: qty 16

## 2013-11-30 MED ORDER — SODIUM CHLORIDE 0.9 % IV SOLN
750.0000 mg | Freq: Once | INTRAVENOUS | Status: AC
  Administered 2013-11-30: 750 mg via INTRAVENOUS
  Filled 2013-11-30: qty 75

## 2013-11-30 MED ORDER — DEXAMETHASONE SODIUM PHOSPHATE 20 MG/5ML IJ SOLN
20.0000 mg | Freq: Once | INTRAMUSCULAR | Status: AC
  Administered 2013-11-30: 20 mg via INTRAVENOUS

## 2013-11-30 MED ORDER — ACETAMINOPHEN 160 MG/5ML PO SOLN
650.0000 mg | Freq: Once | ORAL | Status: AC
  Administered 2013-11-30: 650 mg via ORAL
  Filled 2013-11-30: qty 20.3

## 2013-11-30 MED ORDER — DIPHENHYDRAMINE HCL 25 MG PO CAPS
ORAL_CAPSULE | ORAL | Status: AC
  Filled 2013-11-30: qty 1

## 2013-11-30 MED ORDER — SODIUM CHLORIDE 0.9 % IV SOLN
Freq: Once | INTRAVENOUS | Status: AC
  Administered 2013-11-30: 13:00:00 via INTRAVENOUS

## 2013-11-30 NOTE — Patient Instructions (Signed)
[facility] Discharge Instructions for Patients Receiving Chemotherapy  Today you received the following chemotherapy agents: Herceptin, Taxotere, Carboplatin   To help prevent nausea and vomiting after your treatment, we encourage you to take your nausea medication as prescribed.    If you develop nausea and vomiting that is not controlled by your nausea medication, call the clinic.   BELOW ARE SYMPTOMS THAT SHOULD BE REPORTED IMMEDIATELY:  *FEVER GREATER THAN 100.5 F  *CHILLS WITH OR WITHOUT FEVER  NAUSEA AND VOMITING THAT IS NOT CONTROLLED WITH YOUR NAUSEA MEDICATION  *UNUSUAL SHORTNESS OF BREATH  *UNUSUAL BRUISING OR BLEEDING  TENDERNESS IN MOUTH AND THROAT WITH OR WITHOUT PRESENCE OF ULCERS  *URINARY PROBLEMS  *BOWEL PROBLEMS  UNUSUAL RASH Items with * indicate a potential emergency and should be followed up as soon as possible.  Feel free to call the clinic you have any questions or concerns. The clinic phone number is [phone].

## 2013-11-30 NOTE — Progress Notes (Addendum)
Patient deaccessed at 1551 and ambulated to the bathroom independently. Upon return to collect belongings to leave, pt reports "my tongue is itching." tongue appears to be swelling and patient remains alert and oriented, talkative. PAC reaccessed, hypersensitivity medications given, Cyndee Bacon,NP.  1558 PAC accessed, NS wide open, Benadryl 25 mg IV given 1600 125 solumedrol IV given 1601 pepcid 20 mg given IVP.  1410 patient to remain until 1635 and wait for NP to reassess before discharge.  1615 patient reports improvement of "tongue itching" but not completely resolved. Report given to Kathe Becton, RN to monitor.

## 2013-11-30 NOTE — Progress Notes (Signed)
[facility]  [phone] [phone]     ID: [name] DOB: [birth year]  MR#: [id number]  JEH#:[id number]  Patient Care Team: Horatio Pel, MD as PCP - General (Internal Medicine) Erroll Luna, MD as Consulting Physician (General Surgery) Chauncey Cruel, MD as Consulting Physician (Oncology) Allena Katz, MD as Consulting Physician (Obstetrics and Gynecology)  CHIEF COMPLAINT: Newly diagnosed triple positive breast cancer  CURRENT TREATMENT: neoadjuvant chemotherapy  BREAST CANCER HISTORY: From the original intake note:  The patient herself palpated a mass in her right breast mid July. She brought it to Dr. Sherlynn Stalls attention and he set the patient up for bilateral diagnostic mammography and right breast ultrasonography of the breast Center 07/28/2013. Right mammogram showed an irregular mass in the upper outer quadrant measuring 1.4 cm. There was some associated pleomorphic microcalcifications. This was palpable, at the 10:30 position 11 cm from the nipple. By palpation it measured approximately 1.5 cm. Ultrasound confirmed an hypoechoic mass with irregular borders measuring 1.1 cm. The right axilla showed a few adjacent deep lymph nodes with mild cortical thickening.  On 08/08/2013 the patient underwent biopsy of the breast mass in question. The pathology (SAA [id number]) showed an invasive ductal carcinoma, grade 2, estrogen receptor 44% positive, progesterone receptor 44% positive, both with moderate staining intensity, with an MIB-1 of 52%, and HER-2 amplification with a signals ratio of 8.1. The right axillary lymph node biopsied at the same time was benign this was felt to be possibly concordant.  And 08/15/2013 the patient underwent bilateral breast MRI showing a 2 cm irregular enhancing mass in the upper outer quadrant of the right breast. There were no additional changes no abnormal appearing lymph nodes and no masses or  abnormalities in the left breast.  The patient's subsequent history is as detailed below  INTERVAL HISTORY: Atiana returns today for follow up of her breast cancer, accompanied by her mother. Today is day 44, cycle 5 of taxotere, carboplatin, and herceptin, with neulasta on day 2 for graulocyte support. The perjeta was removed because of intense diarrhea issues early on in treatment. At her midcourse evaluation, the MRI 44 showed a decent response to this chemotherapy regimen and she will continue to complete 6 total cycles.   On her last visit here Serafina complained of palpitations and a distinct murmur was auscultated. She visited Dr. Aundra Dubin and her echo was normal. The palpitations have resolved and an murmur was not heard with his observation. The recommendation was to continue with herceptin as scheduled.   REVIEW OF SYSTEMS: Tonica denies fevers, chills, nausea, or vomiting. She typically experiences diarrhea after chemo, but she has had only 1-2 episodes since her last cycle. Her appetite is healthy and she is staying well hydrated. She denies mouth sores or rashes. She has no shortness of breath, chest pain, cough, palpitations, or fatigue. A detailed review of systems is otherwise noncontributory.   PAST MEDICAL HISTORY: Past Medical History  Diagnosis Date  . Anxiety   . Wears contact lenses   . Snores   . Malignant neoplasm of breast (female), unspecified site     PAST SURGICAL HISTORY: Past Surgical History  Procedure Laterality Date  . Wisdom tooth extraction    . Portacath placement N/A 08/23/2013    Procedure: INSERTION PORT-A-CATH WITH ULTRA SOUND;  Surgeon: Joyice Faster. Cornett, MD;  Location: [facility];  Service: General;  Laterality: N/A;    FAMILY HISTORY Family History  Problem Relation Age of Onset  .  Uterine cancer Mother 23    TAH/BSO  . Colon cancer Father 20    deceased 22  . Leukemia Maternal Grandmother     deceased 28  . Skin cancer Paternal  Grandmother   . Prostate cancer Other    the patient's father died from colon cancer the age of 34. It had been diagnosed 3 years prior. The patient's mother was diagnosed with cancer of the uterus at age 34. She is 44 years old as of August 2015. The patient had no brothers or sisters. There is a history of prostate cancer leukemia at in the family as well as melanoma, but there is no history of breast or ovarian cancer in the family.  GYNECOLOGIC HISTORY:  No LMP recorded. Menarche age 43. The patient is GX P0. She is still having regular periods. 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 She lives alone, with no pets.    ADVANCED DIRECTIVES: Not in place. At her 08/16/2013 visit the patient was given the upper. Documents 2 complete and notarize so she may name her mother, [name] focal as her healthcare power of attorney, which is the patient's intention. Ms. focal can be reached at [phone]. She currently lives in [place] but is planning to move to [place]: History  Substance Use Topics  . Smoking status: Former Smoker    Quit date: 02/20/2013  . Smokeless tobacco: Not on file  . Alcohol Use: Yes     Comment: 1/week     Colonoscopy: Never  PAP: July 2015 Bone density: Never  Lipid panel:  Allergies  Allergen Reactions  . Amoxicillin Rash    Current Outpatient Prescriptions  Medication Sig Dispense Refill  . acetaminophen (TYLENOL) 160 MG/5ML liquid Take 20.3 mLs (650 mg total) by mouth once. 120 mL 0  . Calcium-Vitamin D-Vitamin K (CALCIUM SOFT CHEWS PO) Take by mouth daily.    . cholestyramine (QUESTRAN) 4 G packet Take 1 packet (4 g total) by mouth 3 (three) times daily with meals. 60 each 12  . dexamethasone (DECADRON) 4 MG tablet Take 2 tablets (8 mg total) by mouth 2 (two) times daily. Start the day before Taxotere.  Then again the day after chemo for 3 days. 30 tablet 1  . hydrocortisone (ANUSOL-HC) 25 MG suppository Place 1 suppository (25 mg total) rectally 2 (two) times daily. 12 suppository 1  . lidocaine-prilocaine (EMLA) cream Apply 1 application topically as needed. Apply over port site 1-2 hours before chemo, then cove with plastic wrap 30 g 0  . loratadine (CLARITIN) 10 MG tablet Take 10 mg by mouth daily as needed for allergies.    Marland Kitchen LORazepam (ATIVAN) 0.5 MG tablet TAKE 1 TABLET BY MOUTH AT BEDTIME AS NEEDED (NAUSEA OR VOMITING) 30 tablet 0  . Multiple Vitamins-Minerals (MULTI ADULT GUMMIES PO) Take 2 each by mouth daily.    . ondansetron (ZOFRAN) 8 MG tablet Take 1 tablet (8 mg total) by mouth 2 (two) times daily. Start the day after chemo for 3 days. Then take as needed for nausea or vomiting. 30 tablet 1  . oxyCODONE-acetaminophen (ROXICET) 5-325 MG per tablet Take 1 tablet by mouth every 4 (four) hours as needed. 30 tablet 0  . prochlorperazine (COMPAZINE) 10 MG tablet Take 1 tablet (10 mg total) by mouth every 6 (six) hours as needed (Nausea or vomiting). [place]  tablet 1  . UNABLE TO FIND Apply 1 each topically once. Med Name: Dispense per medical necessity cranial prothesis for this patient with alopecia secondary to chemotherapy due to breast cancer history. 1 each 0   No current facility-administered medications for this visit.   Facility-Administered Medications Ordered in Other Visits  Medication Dose Route Frequency Provider Last Rate Last Dose  . sodium chloride 0.9 % injection 10 mL  10 mL Intravenous PRN Chauncey Cruel, MD   10 mL at 11/30/13 1102    OBJECTIVE: Middle-aged [place] American woman who appears stated age 43 Vitals:   11/30/13 1128  BP: 124/74  Pulse: 96  Temp: 98 F (36.7 C)  Resp: 18     Body mass index is 52.65 kg/(m^2).     ECOG FS:1 - Symptomatic but completely ambulatory  Sclerae unicteric, pupils equal and reactive Oropharynx clear and moist-- no  thrush No cervical or supraclavicular adenopathy Lungs no rales or rhonchi Heart normal rate and rhythm Abd soft, nontender, positive bowel sounds MSK no focal spinal tenderness, no upper extremity lymphedema Neuro: nonfocal, well oriented, appropriate affect Breasts: deferred  LAB RESULTS:  CMP     Component Value Date/Time   NA 136 11/16/2013 1317   K 3.9 11/16/2013 1317   CO2 25 11/16/2013 1317   GLUCOSE 98 11/16/2013 1317   BUN 13.5 11/16/2013 1317   CREATININE 0.9 11/16/2013 1317   CALCIUM 9.1 11/16/2013 1317   PROT 6.4 11/16/2013 1317   ALBUMIN 3.3* 11/16/2013 1317   AST 13 11/16/2013 1317   ALT 22 11/16/2013 1317   ALKPHOS 100 11/16/2013 1317   BILITOT 0.28 11/16/2013 1317    I No results found for: SPEP  Lab Results  Component Value Date   WBC 10.3 11/30/2013   NEUTROABS 7.4* 11/30/2013   HGB 10.5* 11/30/2013   HCT 32.5* 11/30/2013   MCV 84.9 11/30/2013   PLT 374 11/30/2013      Chemistry      Component Value Date/Time   NA 136 11/16/2013 1317   K 3.9 11/16/2013 1317   CO2 25 11/16/2013 1317   BUN 13.5 11/16/2013 1317   CREATININE 0.9 11/16/2013 1317      Component Value Date/Time   CALCIUM 9.1 11/16/2013 1317   ALKPHOS 100 11/16/2013 1317   AST 13 11/16/2013 1317   ALT 22 11/16/2013 1317   BILITOT 0.28 11/16/2013 1317       No results found for: LABCA2  No components found for: LABCA125  No results for input(s): INR in the last 168 hours.  Urinalysis No results found for: COLORURINE  STUDIES: Most recent echocardiogram on 11/28/13 showed an ejection fraction of 65-70%.  ASSESSMENT: 43 y.o. BRCA negative [place] woman status post right breast upper outer quadrant biopsy 08/08/2013 for a triple positive invasive ductal carcinoma, grade 2, with an MIB-1 of 52%, and a suspicious lymph node biopsied at the same time pathologically benign.  (1) genetics testing found no mutations in ATM, BARD1, BRCA1, BRCA2, BRIP1, CDH1, CHEK2, MRE11A,  MUTYH, NBN, NF1, PALB2, PTEN, RAD50, RAD51C, RAD51D, and TP53  (2) neoadjuvant chemotherapy to consist of carboplatin, docetaxel, trastuzumab and pertuzumab for 6 cycles, starting 09/07/2013,  (a) pertuzumab discontinued after cycle 1 because of severe diarrhea  (3) trastuzumab to continue to complete one year (through August 2016); Echocardiogram on 08/25/13 demonstrated a LVEF of 60-65%.    (4) definitive surgery to follow chemotherapy  (5) adjuvant radiation to follow surgery  (6) anti-estrogens to follow radiation.  PLAN: Tira is doing well today. The labs were reviewed in detail and were stable. We will proceed with day 1, cycle 5 of doxcetaxel and carboplatin today.   She will have one more cycle to complete this chemo regimen. I have put in a referral to Dr. Josetta Huddle office for an appointment next month. She would like for surgery to occur as soon as possible after the completion of chemo.   Shareen will return next week for labs and her nadir visit. She understands and agrees with this plan. She knows the goal of treatment in her case is cure. She has been encouraged to call with any issues that might arise before her next visit here.   Marcelino Duster, NP 11/30/2013 11:39 AM

## 2013-11-30 NOTE — Telephone Encounter (Signed)
per pof to sch pt appt w/Cornett-sch & cld pt & left appt time & date

## 2013-11-30 NOTE — Addendum Note (Signed)
Addended by: Alla Feeling on: 11/30/2013 04:18 PM   Modules accepted: Orders

## 2013-12-01 ENCOUNTER — Ambulatory Visit (HOSPITAL_BASED_OUTPATIENT_CLINIC_OR_DEPARTMENT_OTHER): Payer: 59

## 2013-12-01 ENCOUNTER — Other Ambulatory Visit: Payer: Self-pay | Admitting: Oncology

## 2013-12-01 ENCOUNTER — Encounter: Payer: Self-pay | Admitting: Nurse Practitioner

## 2013-12-01 ENCOUNTER — Ambulatory Visit: Payer: 59

## 2013-12-01 DIAGNOSIS — Z5189 Encounter for other specified aftercare: Secondary | ICD-10-CM

## 2013-12-01 DIAGNOSIS — T7840XA Allergy, unspecified, initial encounter: Secondary | ICD-10-CM | POA: Insufficient documentation

## 2013-12-01 DIAGNOSIS — C50411 Malignant neoplasm of upper-outer quadrant of right female breast: Secondary | ICD-10-CM

## 2013-12-01 MED ORDER — PEGFILGRASTIM INJECTION 6 MG/0.6ML ~~LOC~~
6.0000 mg | PREFILLED_SYRINGE | Freq: Once | SUBCUTANEOUS | Status: AC
  Administered 2013-12-01: 6 mg via SUBCUTANEOUS
  Filled 2013-12-01: qty 0.6

## 2013-12-01 NOTE — Assessment & Plan Note (Signed)
Patient had completed all of cycle 5 of her carboplatin/docetaxel chemotherapy this afternoon; and was preparing to be discharged-when she experienced a delayed hypersensitivity reaction which consisted of some tongue swelling and numbness.  Patient was given Benadryl 25 mg IV, Pepcid 20 mg IV, and Solu-Medrol 125 mg IV per protocol.  All of patient's hypersensitivity reactions resolved; and patient was eventually discharged after approximately one hour of close monitoring.

## 2013-12-01 NOTE — Assessment & Plan Note (Signed)
Patient completed cycle 5 of her carboplatin/docetaxel chemotherapy today.  She did experience a mild hypersensitivity reaction which consisted of some tongue swelling and numbness following all of her chemotherapy today.  Hypersensitivity reaction was managed per hypersensitivity protocol; and all symptoms did resolve.  Patient is scheduled to receive her final cycle of chemotherapy on the supratip 2015.  Patient may very well need the carboplatinum portion of her chemotherapy at either reduced or held for this final cycle of chemotherapy do to a hypersensitivity reaction symptoms experienced today following chemotherapy.

## 2013-12-01 NOTE — Progress Notes (Signed)
will   SYMPTOM MANAGEMENT CLINIC   HPI: [name] 43 y.o. female diagnosed with breast cancer.  Currently undergoing carboplatin/docetaxel chemotherapy regimen.  Patient presented to the [facility] today to receive cycle 5 of her carboplatin/docetaxel chemotherapy regimen.  She had asked the completed all of her chemotherapy today; and was preparing to be discharged from the cancer Center-when she experienced a delayed hypersensitivity reaction which consisted of some tongue swelling and tingling.  Patient had no difficulty with swallowing or any airway obstruction.  Hypersensitivity reaction was managed per hypersensitivity protocol medications.  Also comes did quickly resolve; and patient was able to be discharged from the [facility] today.   HPI  CURRENT THERAPY: Upcoming Treatment Dates - BREAST TCH q21d (order Herceptin separately) Days with orders from any treatment category:  12/21/2013      SCHEDULING COMMUNICATION      ondansetron (ZOFRAN) IVPB 16 mg      Dexamethasone Sodium Phosphate (DECADRON) injection 20 mg      DOCEtaxel (TAXOTERE) 190 mg in dextrose 5 % 500 mL chemo infusion      CARBOplatin (PARAPLATIN) 750 mg in sodium chloride 0.9 % 250 mL chemo infusion      lidocaine-prilocaine (EMLA) cream      sodium chloride 0.9 % injection 10 mL      heparin lock flush 100 unit/mL      heparin lock flush 100 unit/mL      alteplase (CATHFLO ACTIVASE) injection 2 mg      sodium chloride 0.9 % injection 3 mL      Cold Pack 1 packet      diphenhydrAMINE (BENADRYL) injection 25 mg      famotidine (PEPCID) IVPB 20 mg      0.9 %  sodium chloride infusion      methylPREDNISolone sodium succinate (SOLU-MEDROL) 125 mg/2 mL injection 125 mg      EPINEPHrine (ADRENALIN) 0.1 MG/ML injection 0.25 mg      EPINEPHrine (ADRENALIN) 0.1 MG/ML injection 0.25 mg      EPINEPHrine (ADRENALIN) injection 0.5 mg      EPINEPHrine (ADRENALIN) injection 0.5 mg      diphenhydrAMINE (BENADRYL)  injection 50 mg      albuterol (PROVENTIL) (2.5 MG/3ML) 0.083% nebulizer solution 2.5 mg      0.9 %  sodium chloride infusion      TREATMENT CONDITIONS 12/22/2013      SCHEDULING COMMUNICATION INJECTION      pegfilgrastim (NEULASTA) injection 6 mg    ROS  Past Medical History  Diagnosis Date  . Anxiety   . Wears contact lenses   . Snores   . Malignant neoplasm of breast (female), unspecified site     Past Surgical History  Procedure Laterality Date  . Wisdom tooth extraction    . Portacath placement N/A 08/23/2013    Procedure: INSERTION PORT-A-CATH WITH ULTRA SOUND;  Surgeon: Joyice Faster. Cornett, MD;  Location: [facility];  Service: General;  Laterality: N/A;    has Breast cancer of upper-outer quadrant of right female breast; Diarrhea; Hemorrhoids; Skin abnormalities; Steroid-induced hyperglycemia; Nail discoloration; Palpitations; Chemotherapy-induced nausea; Irregular bowel habits; Insomnia; and Hypersensitivity reaction on her problem list.     is allergic to amoxicillin.    Medication List       This list is accurate as of: 11/30/13 11:59 PM.  Always use your most recent med list.               acetaminophen 160 MG/5ML  liquid  Commonly known as:  TYLENOL  Take 20.3 mLs (650 mg total) by mouth once.     CALCIUM SOFT CHEWS PO  Take by mouth daily.     cholestyramine 4 G packet  Commonly known as:  QUESTRAN  Take 1 packet (4 g total) by mouth 3 (three) times daily with meals.     dexamethasone 4 MG tablet  Commonly known as:  DECADRON  Take 2 tablets (8 mg total) by mouth 2 (two) times daily. Start the day before Taxotere. Then again the day after chemo for 3 days.     hydrocortisone 25 MG suppository  Commonly known as:  ANUSOL-HC  Place 1 suppository (25 mg total) rectally 2 (two) times daily.     lidocaine-prilocaine cream  Commonly known as:  EMLA  Apply 1 application topically as needed. Apply over port site 1-2 hours before chemo, then  cove with plastic wrap     loratadine 10 MG tablet  Commonly known as:  CLARITIN  Take 10 mg by mouth daily as needed for allergies.     LORazepam 0.5 MG tablet  Commonly known as:  ATIVAN  TAKE 1 TABLET BY MOUTH AT BEDTIME AS NEEDED (NAUSEA OR VOMITING)     MULTI ADULT GUMMIES PO  Take 2 each by mouth daily.     ondansetron 8 MG tablet  Commonly known as:  ZOFRAN  Take 1 tablet (8 mg total) by mouth 2 (two) times daily. Start the day after chemo for 3 days. Then take as needed for nausea or vomiting.     oxyCODONE-acetaminophen 5-325 MG per tablet  Commonly known as:  ROXICET  Take 1 tablet by mouth every 4 (four) hours as needed.     prochlorperazine 10 MG tablet  Commonly known as:  COMPAZINE  Take 1 tablet (10 mg total) by mouth every 6 (six) hours as needed (Nausea or vomiting).     UNABLE TO FIND  Apply 1 each topically once. Med Name: Dispense per medical necessity cranial prothesis for this patient with alopecia secondary to chemotherapy due to breast cancer history.         PHYSICAL EXAMINATION  Vitals: BP 141/81, HR 87 temp 98.1  Physical Exam  Constitutional: She is oriented to person, place, and time and well-developed, well-nourished, and in no distress.  HENT:  Head: Normocephalic and atraumatic.  Mouth/Throat: Oropharynx is clear and moist.  On initial exam patient's tongue was edematous; the airway remained intact and patient was able to manage all of her secretions.  Patient was able to drink orange juice with no difficulty.  Following administration of all hypersensitivity protocol medications-tongue swelling completely resolved.  Eyes: Conjunctivae and EOM are normal. Pupils are equal, round, and reactive to light. Right eye exhibits no discharge. Left eye exhibits no discharge.  Neck: Normal range of motion. Neck supple. No JVD present. No tracheal deviation present. No thyromegaly present.  Cardiovascular: Normal rate, regular rhythm, normal heart  sounds and intact distal pulses.   Pulmonary/Chest: Effort normal and breath sounds normal. No respiratory distress. She has no wheezes. She has no rales.  Abdominal: Soft. Bowel sounds are normal. She exhibits no distension. There is no tenderness. There is no rebound.  Musculoskeletal: Normal range of motion. She exhibits no edema or tenderness.  Lymphadenopathy:    She has no cervical adenopathy.  Neurological: She is alert and oriented to person, place, and time. Gait normal.  Skin: Skin is warm and dry. No rash noted.  No erythema.  Psychiatric: Affect normal.  Nursing note and vitals reviewed.   LABORATORY DATA:. Appointment on 11/30/2013  Component Date Value Ref Range Status  . WBC 11/30/2013 10.3  3.9 - 10.3 10e3/uL Final  . NEUT# 11/30/2013 7.4* 1.5 - 6.5 10e3/uL Final  . HGB 11/30/2013 10.5* 11.6 - 15.9 g/dL Final  . HCT 11/30/2013 32.5* 34.8 - 46.6 % Final  . Platelets 11/30/2013 374  145 - 400 10e3/uL Final  . MCV 11/30/2013 84.9  79.5 - 101.0 fL Final  . MCH 11/30/2013 27.4  25.1 - 34.0 pg Final  . MCHC 11/30/2013 32.3  31.5 - 36.0 g/dL Final  . RBC 11/30/2013 3.83  3.70 - 5.45 10e6/uL Final  . RDW 11/30/2013 19.6* 11.2 - 14.5 % Final  . lymph# 11/30/2013 2.3  0.9 - 3.3 10e3/uL Final  . MONO# 11/30/2013 0.7  0.1 - 0.9 10e3/uL Final  . Eosinophils Absolute 11/30/2013 0.0  0.0 - 0.5 10e3/uL Final  . Basophils Absolute 11/30/2013 0.0  0.0 - 0.1 10e3/uL Final  . NEUT% 11/30/2013 71.1  38.4 - 76.8 % Final  . LYMPH% 11/30/2013 21.8  14.0 - 49.7 % Final  . MONO% 11/30/2013 7.0  0.0 - 14.0 % Final  . EOS% 11/30/2013 0.0  0.0 - 7.0 % Final  . BASO% 11/30/2013 0.1  0.0 - 2.0 % Final  . Sodium 11/30/2013 138  136 - 145 mEq/L Final  . Potassium 11/30/2013 4.1  3.5 - 5.1 mEq/L Final  . Chloride 11/30/2013 107  98 - 109 mEq/L Final  . CO2 11/30/2013 23  22 - 29 mEq/L Final  . Glucose 11/30/2013 101  70 - 140 mg/dl Final  . BUN 11/30/2013 13.6  7.0 - 26.0 mg/dL Final  .  Creatinine 11/30/2013 0.8  0.6 - 1.1 mg/dL Final  . Total Bilirubin 11/30/2013 <0.20  0.20 - 1.20 mg/dL Final  . Alkaline Phosphatase 11/30/2013 85  40 - 150 U/L Final  . AST 11/30/2013 17  5 - 34 U/L Final  . ALT 11/30/2013 28  0 - 55 U/L Final  . Total Protein 11/30/2013 7.1  6.4 - 8.3 g/dL Final  . Albumin 11/30/2013 3.4* 3.5 - 5.0 g/dL Final  . Calcium 11/30/2013 9.5  8.4 - 10.4 mg/dL Final  . Anion Gap 11/30/2013 8  3 - 11 mEq/L Final     RADIOGRAPHIC STUDIES: No results found.  ASSESSMENT/PLAN:    Hypersensitivity reaction Patient had completed all of cycle 5 of her carboplatin/docetaxel chemotherapy this afternoon; and was preparing to be discharged-when she experienced a delayed hypersensitivity reaction which consisted of some tongue swelling and numbness.  Patient was given Benadryl 25 mg IV, Pepcid 20 mg IV, and Solu-Medrol 125 mg IV per protocol.  All of patient's hypersensitivity reactions resolved; and patient was eventually discharged after approximately one hour of close monitoring.  Breast cancer of upper-outer quadrant of right female breast Patient completed cycle 5 of her carboplatin/docetaxel chemotherapy today.  She did experience a mild hypersensitivity reaction which consisted of some tongue swelling and numbness following all of her chemotherapy today.  Hypersensitivity reaction was managed per hypersensitivity protocol; and all symptoms did resolve.  Patient is scheduled to receive her final cycle of chemotherapy on the supratip 2015.  Patient may very well need the carboplatinum portion of her chemotherapy at either reduced or held for this final cycle of chemotherapy do to a hypersensitivity reaction symptoms experienced today following chemotherapy.   Patient stated understanding of all instructions; and  was in agreement with this plan of care. The patient knows to call the clinic with any problems, questions or concerns.   Review/collaboration with Dr. Jana Hakim  regarding all aspects of patient's visit today.   Total time spent with patient was 25 minutes;  with greater than 80 percent of that time spent in face to face counseling regarding her symptoms, frequent monitoring of patient in the infusion area, and coordination of care and follow up.  Disclaimer: This note was dictated with voice recognition software. Similar sounding words can inadvertently be transcribed and may not be corrected upon review.   Drue Second, NP 12/01/2013

## 2013-12-01 NOTE — Patient Instructions (Signed)
Pegfilgrastim injection What is this medicine? PEGFILGRASTIM (peg fil GRA stim) is a long-acting granulocyte colony-stimulating factor that stimulates the growth of neutrophils, a type of white blood cell important in the body's fight against infection. It is used to reduce the incidence of fever and infection in patients with certain types of cancer who are receiving chemotherapy that affects the bone marrow. This medicine may be used for other purposes; ask your health care provider or pharmacist if you have questions. COMMON BRAND NAME(S): Neulasta What should I tell my health care provider before I take this medicine? They need to know if you have any of these conditions: -latex allergy -ongoing radiation therapy -sickle cell disease -skin reactions to acrylic adhesives (On-Body Injector only) -an unusual or allergic reaction to pegfilgrastim, filgrastim, other medicines, foods, dyes, or preservatives -pregnant or trying to get pregnant -breast-feeding How should I use this medicine? This medicine is for injection under the skin. If you get this medicine at home, you will be taught how to prepare and give the pre-filled syringe or how to use the On-body Injector. Refer to the patient Instructions for Use for detailed instructions. Use exactly as directed. Take your medicine at regular intervals. Do not take your medicine more often than directed. It is important that you put your used needles and syringes in a special sharps container. Do not put them in a trash can. If you do not have a sharps container, call your pharmacist or healthcare provider to get one. Talk to your pediatrician regarding the use of this medicine in children. Special care may be needed. Overdosage: If you think you have taken too much of this medicine contact a poison control center or emergency room at once. NOTE: This medicine is only for you. Do not share this medicine with others. What if I miss a dose? It is  important not to miss your dose. Call your doctor or health care professional if you miss your dose. If you miss a dose due to an On-body Injector failure or leakage, a new dose should be administered as soon as possible using a single prefilled syringe for manual use. What may interact with this medicine? Interactions have not been studied. Give your health care provider a list of all the medicines, herbs, non-prescription drugs, or dietary supplements you use. Also tell them if you smoke, drink alcohol, or use illegal drugs. Some items may interact with your medicine. This list may not describe all possible interactions. Give your health care provider a list of all the medicines, herbs, non-prescription drugs, or dietary supplements you use. Also tell them if you smoke, drink alcohol, or use illegal drugs. Some items may interact with your medicine. What should I watch for while using this medicine? You may need blood work done while you are taking this medicine. If you are going to need a MRI, CT scan, or other procedure, tell your doctor that you are using this medicine (On-Body Injector only). What side effects may I notice from receiving this medicine? Side effects that you should report to your doctor or health care professional as soon as possible: -allergic reactions like skin rash, itching or hives, swelling of the face, lips, or tongue -dizziness -fever -pain, redness, or irritation at site where injected -pinpoint red spots on the skin -shortness of breath or breathing problems -stomach or side pain, or pain at the shoulder -swelling -tiredness -trouble passing urine Side effects that usually do not require medical attention (report to your doctor   or health care professional if they continue or are bothersome): -bone pain -muscle pain This list may not describe all possible side effects. Call your doctor for medical advice about side effects. You may report side effects to FDA at  1-800-FDA-1088. Where should I keep my medicine? Keep out of the reach of children. Store pre-filled syringes in a refrigerator between 2 and 8 degrees C (36 and 46 degrees F). Do not freeze. Keep in carton to protect from light. Throw away this medicine if it is left out of the refrigerator for more than 48 hours. Throw away any unused medicine after the expiration date. NOTE: This sheet is a summary. It may not cover all possible information. If you have questions about this medicine, talk to your doctor, pharmacist, or health care provider.  2015, Elsevier/Gold Standard. (2013-03-30 16:14:05)  

## 2013-12-05 ENCOUNTER — Encounter (HOSPITAL_COMMUNITY): Payer: 59

## 2013-12-05 ENCOUNTER — Ambulatory Visit (HOSPITAL_COMMUNITY): Payer: 59

## 2013-12-06 ENCOUNTER — Encounter: Payer: Self-pay | Admitting: Nurse Practitioner

## 2013-12-06 ENCOUNTER — Other Ambulatory Visit (HOSPITAL_BASED_OUTPATIENT_CLINIC_OR_DEPARTMENT_OTHER): Payer: 59

## 2013-12-06 ENCOUNTER — Ambulatory Visit (HOSPITAL_BASED_OUTPATIENT_CLINIC_OR_DEPARTMENT_OTHER): Payer: 59 | Admitting: Nurse Practitioner

## 2013-12-06 VITALS — BP 112/64 | HR 111 | Temp 98.2°F | Resp 18 | Ht 65.0 in | Wt 310.4 lb

## 2013-12-06 DIAGNOSIS — Z170 Estrogen receptor positive status [ER+]: Secondary | ICD-10-CM

## 2013-12-06 DIAGNOSIS — C50411 Malignant neoplasm of upper-outer quadrant of right female breast: Secondary | ICD-10-CM

## 2013-12-06 DIAGNOSIS — T451X5A Adverse effect of antineoplastic and immunosuppressive drugs, initial encounter: Secondary | ICD-10-CM

## 2013-12-06 DIAGNOSIS — G629 Polyneuropathy, unspecified: Secondary | ICD-10-CM

## 2013-12-06 DIAGNOSIS — G620 Drug-induced polyneuropathy: Secondary | ICD-10-CM | POA: Insufficient documentation

## 2013-12-06 LAB — CBC WITH DIFFERENTIAL/PLATELET
BASO%: 0.2 % (ref 0.0–2.0)
Basophils Absolute: 0 10*3/uL (ref 0.0–0.1)
EOS ABS: 0 10*3/uL (ref 0.0–0.5)
EOS%: 0.3 % (ref 0.0–7.0)
HCT: 34.8 % (ref 34.8–46.6)
HEMOGLOBIN: 11.2 g/dL — AB (ref 11.6–15.9)
LYMPH#: 6.2 10*3/uL — AB (ref 0.9–3.3)
LYMPH%: 42.3 % (ref 14.0–49.7)
MCH: 27.6 pg (ref 25.1–34.0)
MCHC: 32.2 g/dL (ref 31.5–36.0)
MCV: 85.7 fL (ref 79.5–101.0)
MONO#: 1.1 10*3/uL — ABNORMAL HIGH (ref 0.1–0.9)
MONO%: 7.5 % (ref 0.0–14.0)
NEUT%: 49.7 % (ref 38.4–76.8)
NEUTROS ABS: 7.3 10*3/uL — AB (ref 1.5–6.5)
PLATELETS: 218 10*3/uL (ref 145–400)
RBC: 4.06 10*6/uL (ref 3.70–5.45)
RDW: 19.4 % — ABNORMAL HIGH (ref 11.2–14.5)
WBC: 14.6 10*3/uL — ABNORMAL HIGH (ref 3.9–10.3)

## 2013-12-06 LAB — COMPREHENSIVE METABOLIC PANEL (CC13)
ALT: 24 U/L (ref 0–55)
ANION GAP: 11 meq/L (ref 3–11)
AST: 16 U/L (ref 5–34)
Albumin: 3.4 g/dL — ABNORMAL LOW (ref 3.5–5.0)
Alkaline Phosphatase: 117 U/L (ref 40–150)
BILIRUBIN TOTAL: 0.4 mg/dL (ref 0.20–1.20)
BUN: 13.4 mg/dL (ref 7.0–26.0)
CO2: 24 mEq/L (ref 22–29)
CREATININE: 0.9 mg/dL (ref 0.6–1.1)
Calcium: 8.9 mg/dL (ref 8.4–10.4)
Chloride: 102 mEq/L (ref 98–109)
GLUCOSE: 97 mg/dL (ref 70–140)
Potassium: 3.4 mEq/L — ABNORMAL LOW (ref 3.5–5.1)
SODIUM: 138 meq/L (ref 136–145)
TOTAL PROTEIN: 6.6 g/dL (ref 6.4–8.3)

## 2013-12-06 NOTE — Progress Notes (Signed)
[facility]  [phone] [phone]     ID: [name] DOB: May 07, 1970  MR#: [id number]  ZDG#:[id number]  Patient Care Team: Linda Pel, MD as PCP - General (Internal Medicine) Linda Luna, MD as Consulting Physician (General Surgery) Linda Cruel, MD as Consulting Physician (Oncology) Linda Katz, MD as Consulting Physician (Obstetrics and Gynecology)  CHIEF COMPLAINT: Newly diagnosed triple positive breast cancer  CURRENT TREATMENT: neoadjuvant chemotherapy  BREAST CANCER HISTORY: From the original intake note:  The patient herself palpated a mass in her right breast mid July. She brought it to Dr. Sherlynn [name] attention and he set the patient up for bilateral diagnostic mammography and right breast ultrasonography of the breast Center 07/28/2013. Right mammogram showed an irregular mass in the upper outer quadrant measuring 1.4 cm. There was some associated pleomorphic microcalcifications. This was palpable, at the 10:30 position 11 cm from the nipple. By palpation it measured approximately 1.5 cm. Ultrasound confirmed an hypoechoic mass with irregular borders measuring 1.1 cm. The right axilla showed a few adjacent deep lymph nodes with mild cortical thickening.  On 08/08/2013 the patient underwent biopsy of the breast mass in question. The pathology ([id number]) showed an invasive ductal carcinoma, grade 2, estrogen receptor 49% positive, progesterone receptor 47% positive, both with moderate staining intensity, with an MIB-1 of 52%, and HER-2 amplification with a signals ratio of 8.1. The right axillary lymph node biopsied at the same time was benign this was felt to be possibly concordant.  And 08/15/2013 the patient underwent bilateral breast MRI showing a 2 cm irregular enhancing mass in the upper outer quadrant of the right breast. There were no additional changes no abnormal appearing lymph nodes and no masses or  abnormalities in the left breast.  The patient's subsequent history is as detailed below  INTERVAL HISTORY: [name] returns today for follow up of her breast cancer, accompanied by her mother. Today is day 8, cycle 5 of taxotere, carboplatin, and herceptin, with neulasta on day 2 for graulocyte support. Last week she had a mild hypersensitivity reaction which consisted of some tongue swelling and numbness. It was managed per the hypersensitivity protocol and all of the symptoms resolved quickly. Dr. Jana [name] has adjusted her last cycle of chemo to include cyclophosphamide instead of carboplatin.   REVIEW OF SYSTEMS: [name] denies fevers, chills, nausea, or vomiting. She is passing some hard stools, but is afraid to use a stool softener because she typically heads into diarrhea bout a week after treatment. Her appetite is decreased secondary to taste changes but she is eating and drinking as much as she can. She has felt some light palpitations earlier this week, but they were not "panic-inducing" as they have been in the past. New this week is the complaint of numbness to her bilateral finger pads, that is persistent. A detailed review of systems is otherwise noncontributory.   PAST MEDICAL HISTORY: Past Medical History  Diagnosis Date  . Anxiety   . Wears contact lenses   . Snores   . Malignant neoplasm of breast (female), unspecified site     PAST SURGICAL HISTORY: Past Surgical History  Procedure Laterality Date  . Wisdom tooth extraction    . Portacath placement N/A 08/23/2013    Procedure: INSERTION PORT-A-CATH WITH ULTRA SOUND;  Surgeon: Linda Faster. Cornett, MD;  Location: [facility];  Service: General;  Laterality: N/A;    FAMILY HISTORY Family History  Problem Relation Age of Onset  . Uterine  cancer Mother 28    TAH/BSO  . Colon cancer Father 70    deceased 91  . Leukemia Maternal Grandmother     deceased 74  . Skin cancer Paternal Grandmother   . Prostate  cancer Other    the patient's father died from colon cancer the age of 85. It had been diagnosed 3 years prior. The patient's mother was diagnosed with cancer of the uterus at age 62. She is 44 years old as of August 2015. The patient had no brothers or sisters. There is a history of prostate cancer leukemia at in the family as well as melanoma, but there is no history of breast or ovarian cancer in the family.  GYNECOLOGIC HISTORY:  No LMP recorded. Menarche age 1. The patient is GX P0. She is still having regular periods. 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 She lives alone, with no pets.    ADVANCED DIRECTIVES: Not in place. At her 08/16/2013 visit the patient was given the upper. Documents 2 complete and notarize so she may name her mother, [name] focal as her healthcare power of attorney, which is the patient's intention. Ms. focal can be reached at [phone]. She currently lives in [place] but is planning to move to [place]: History  Substance Use Topics  . Smoking status: Former Smoker    Quit date: 02/20/2013  . Smokeless tobacco: Not on file  . Alcohol Use: Yes     Comment: 1/week     Colonoscopy: Never  PAP: July 2015 Bone density: Never  Lipid panel:  Allergies  Allergen Reactions  . Amoxicillin Rash    Current Outpatient Prescriptions  Medication Sig Dispense Refill  . Calcium-Vitamin D-Vitamin K (CALCIUM SOFT CHEWS PO) Take by mouth daily.    Marland Kitchen dexamethasone (DECADRON) 4 MG tablet Take 2 tablets (8 mg total) by mouth 2 (two) times daily. Start the day before Taxotere. Then again the day after chemo for 3 days. 30 tablet 1  . hydrocortisone (ANUSOL-HC) 25 MG suppository Place 1 suppository (25 mg total) rectally 2 (two) times daily. 12 suppository 1  . lidocaine-prilocaine (EMLA) cream Apply 1 application  topically as needed. Apply over port site 1-2 hours before chemo, then cove with plastic wrap 30 g 0  . loratadine (CLARITIN) 10 MG tablet Take 10 mg by mouth daily as needed for allergies.    Marland Kitchen LORazepam (ATIVAN) 0.5 MG tablet TAKE 1 TABLET BY MOUTH AT BEDTIME AS NEEDED (NAUSEA OR VOMITING) 30 tablet 0  . Multiple Vitamins-Minerals (MULTI ADULT GUMMIES PO) Take 2 each by mouth daily.    . ondansetron (ZOFRAN) 8 MG tablet Take 1 tablet (8 mg total) by mouth 2 (two) times daily. Start the day after chemo for 3 days. Then take as needed for nausea or vomiting. 30 tablet 1  . UNABLE TO FIND Apply 1 each topically once. Med Name: Dispense per medical necessity cranial prothesis for this patient with alopecia secondary to chemotherapy due to breast cancer history. 1 each 0  . acetaminophen (TYLENOL) 160 MG/5ML liquid Take 20.3 mLs (650 mg total) by mouth once. 120 mL 0  . cholestyramine (QUESTRAN) 4 G packet Take 1 packet (4 g total) by mouth 3 (three) times daily with meals. (Patient not taking: Reported on 12/06/2013) 60 each 12  . oxyCODONE-acetaminophen (ROXICET) 5-325 MG per tablet  Take 1 tablet by mouth every 4 (four) hours as needed. (Patient not taking: Reported on 12/06/2013) 30 tablet 0  . prochlorperazine (COMPAZINE) 10 MG tablet Take 1 tablet (10 mg total) by mouth every 6 (six) hours as needed (Nausea or vomiting). (Patient not taking: Reported on 12/06/2013) 30 tablet 1   No current facility-administered medications for this visit.   Facility-Administered Medications Ordered in Other Visits  Medication Dose Route Frequency Provider Last Rate Last Dose  . sodium chloride 0.9 % injection 10 mL  10 mL Intravenous PRN Linda Cruel, MD   10 mL at 11/30/13 1102    OBJECTIVE: Middle-aged [place] American woman who appears stated age 58 Vitals:   12/06/13 1145  BP: 112/64  Pulse: 111  Temp: 98.2 F (36.8 C)  Resp: 18     Body mass index is 51.65 kg/(m^2).     ECOG FS:1 -  Symptomatic but completely ambulatory   Sclerae unicteric, pupils equal and reactive Oropharynx clear and moist-- no thrush No cervical or supraclavicular adenopathy Lungs no rales or rhonchi Heart regular rate and rhythm Abd soft, nontender, positive bowel sounds MSK no focal spinal tenderness, no upper extremity lymphedema Neuro: nonfocal, well oriented, appropriate affect Breasts: deferred  LAB RESULTS:  CMP     Component Value Date/Time   NA 138 12/06/2013 1128   K 3.4* 12/06/2013 1128   CO2 24 12/06/2013 1128   GLUCOSE 97 12/06/2013 1128   BUN 13.4 12/06/2013 1128   CREATININE 0.9 12/06/2013 1128   CALCIUM 8.9 12/06/2013 1128   PROT 6.6 12/06/2013 1128   ALBUMIN 3.4* 12/06/2013 1128   AST 16 12/06/2013 1128   ALT 24 12/06/2013 1128   ALKPHOS 117 12/06/2013 1128   BILITOT 0.40 12/06/2013 1128    I No results found for: SPEP  Lab Results  Component Value Date   WBC 14.6* 12/06/2013   NEUTROABS 7.3* 12/06/2013   HGB 11.2* 12/06/2013   HCT 34.8 12/06/2013   MCV 85.7 12/06/2013   PLT 218 12/06/2013      Chemistry      Component Value Date/Time   NA 138 12/06/2013 1128   K 3.4* 12/06/2013 1128   CO2 24 12/06/2013 1128   BUN 13.4 12/06/2013 1128   CREATININE 0.9 12/06/2013 1128      Component Value Date/Time   CALCIUM 8.9 12/06/2013 1128   ALKPHOS 117 12/06/2013 1128   AST 16 12/06/2013 1128   ALT 24 12/06/2013 1128   BILITOT 0.40 12/06/2013 1128       No results found for: LABCA2  No components found for: [id number]  No results for input(s): INR in the last 168 hours.  Urinalysis No results found for: COLORURINE  STUDIES: Most recent echocardiogram on 11/28/13 showed an ejection fraction of 65-70%.  ASSESSMENT: 43 y.o. BRCA negative [place] woman status post right breast upper outer quadrant biopsy 08/08/2013 for a triple positive invasive ductal carcinoma, grade 2, with an MIB-1 of 52%, and a suspicious lymph node biopsied at the same time  pathologically benign.  (1) genetics testing found no mutations in ATM, BARD1, BRCA1, BRCA2, BRIP1, CDH1, CHEK2, MRE11A, MUTYH, NBN, NF1, PALB2, PTEN, RAD50, RAD51C, RAD51D, and TP53  (2) neoadjuvant chemotherapy to consist of carboplatin, docetaxel, trastuzumab and pertuzumab for 6 cycles, starting 09/07/2013,  (a) pertuzumab discontinued after cycle 1 because of severe diarrhea  (b) carboplatin switched for cyclophosphamide because of hypersensitivity reaction on 1119/15  (c) docetaxel removed from final cycle because of peripheral neuropathy symptoms.  (  3) trastuzumab to continue to complete one year (through August 2016); Echocardiogram on 08/25/13 demonstrated a LVEF of 60-65%.    (4) definitive surgery to follow chemotherapy  (5) adjuvant radiation to follow surgery  (6) anti-estrogens to follow radiation.  PLAN: The labs were reviewed in detail and were stable. I reviewed [name]'s new neuropathy symptoms with Dr. Jana [name] as this toxicity is concerning. He suggested we remove with docetaxel, and treat with cyclophosphamide and trastuzumab alone.   [name] has a follow up appointment with Dr. Josetta Huddle office scheduled for this upcoming Monday. She will return here on 12/21/13 for her 6th and final cycle of chemo. She understands and agrees with this plan. She knows the goal of treatment in her case is cure. She has been encouraged to call with any issues that might arise before her next visit here.   Marcelino Duster, NP 12/06/2013 1:39 PM

## 2013-12-11 ENCOUNTER — Ambulatory Visit (INDEPENDENT_AMBULATORY_CARE_PROVIDER_SITE_OTHER): Payer: Self-pay | Admitting: Surgery

## 2013-12-11 DIAGNOSIS — C50911 Malignant neoplasm of unspecified site of right female breast: Secondary | ICD-10-CM

## 2013-12-11 MED ORDER — DEXTROSE 5 % IV SOLN
600.0000 mg | Freq: Once | INTRAVENOUS | Status: AC
  Administered 2014-01-25: 600 mg via INTRAVENOUS

## 2013-12-11 NOTE — H&P (Signed)
[name] 12/11/2013 11:53 AM Location: [facility] Patient #: [id number] DOB: March 11, 1970 Single / Language: [name] / Race: Black or African American Female History of Present Illness [name] A. Linda Bryand MD; 12/11/2013 12:29 PM) Patient words: breast f/u  The patient herself palpated a mass in her right breast mid July. She brought it to Dr. Sherlynn [name] attention and he set the patient up for bilateral diagnostic mammography and right breast ultrasonography of the breast Center 07/28/2013. Right mammogram showed an irregular mass in the upper outer quadrant measuring 1.4 cm. There was some associated pleomorphic microcalcifications. This was palpable, at the 10:30 position 11 cm from the nipple. By palpation it measured approximately 1.5 cm. Ultrasound confirmed an hypoechoic mass with irregular borders measuring 1.1 cm. The right axilla showed a few adjacent deep lymph nodes with mild cortical thickening.  On 08/08/2013 the patient underwent biopsy of the breast mass in question. The pathology (SAA [id number]) showed an invasive ductal carcinoma, grade 2, estrogen receptor 49% positive, progesterone receptor 47% positive, both with moderate staining intensity, with an MIB-1 of 52%, and HER-2 amplification with a signals ratio of 8.1. The right axillary lymph node biopsied at the same time was benign this was felt to be possibly concordant.  And 08/15/2013 the patient underwent bilateral breast MRI showing a 2 cm irregular enhancing mass in the upper outer quadrant of the right breast. There were no additional changes no abnormal appearing lymph nodes and no masses or abnormalities in the left breast.  The patient's subsequent history is as detailed below  INTERVAL HISTORY: [name] returns today for follow up of her breast cancer. She is doing ok. Ready for surgery. cannot feel mass on the right    CLINICAL DATA: 43 year old female with biopsy proven invasive mammary carcinoma in the  upper-outer quadrant of the right breast. The patient had a right axillary lymph node biopsied which showed benign lymphoid tissue with no evidence of malignancy. Correlation with sentinel lymph node surgical biopsy was recommended. Assess response to neoadjuvant chemotherapy.  LABS: None obtained at the time of imaging.  EXAM: BILATERAL BREAST MRI WITH AND WITHOUT CONTRAST  TECHNIQUE: Multiplanar, multisequence MR images of both breasts were obtained prior to and following the intravenous administration of 70m of MultiHance.  THREE-DIMENSIONAL MR IMAGE RENDERING ON INDEPENDENT WORKSTATION:  Three-dimensional MR images were rendered by post-processing of the original MR data on an independent workstation. The three-dimensional MR images were interpreted, and findings are reported in the following complete MRI report for this study. Three dimensional images were evaluated at the independent DynaCad workstation  COMPARISON: Mammograms dated 08/08/2013 and 07/28/2013. Prior MRI dated 08/15/2013.  FINDINGS: Breast composition: b. Scattered fibroglandular tissue.  Background parenchymal enhancement: Mild  Right breast: The irregular enhancing mass in the middle third of the upper outer quadrant of the right breast has decreased in the intensity of enhancement in size. The mass currently measures 2.0 x 1.0 x 0.8 cm. It is associated with a signal void artifact corresponding with the biopsy clip. On the prior MRI dated 08/15/2013 it measured 2.0 x 1.4 x 1.4 cm.  Left breast: No mass or abnormal enhancement.  Lymph nodes: No abnormal appearing lymph nodes.  Ancillary findings: None.  IMPRESSION: Interval reduction in the size of the irregular enhancing mass in the upper-outer quadrant of the right breast corresponding to response to neoadjuvant chemotherapy.  RECOMMENDATION: Treatment plan.  BI-RADS CATEGORY 6: Known biopsy-proven malignancy.   Electronically  Signed By: Linda MountainM.D. On: 10/30/2013 09:51.  The patient is a 43 year old female   Other Problems [name], [place]; 12/11/2013 11:53 AM) Breast Cancer Hemorrhoids Kidney Stone Lump In Breast  Past Surgical History [name], [place]; 12/11/2013 11:53 AM) Breast Biopsy Right. Oral Surgery Sentinel Lymph Node Biopsy  Diagnostic Studies History [name], [place]; 12/11/2013 11:53 AM) Colonoscopy never Mammogram within last year Pap Smear 1-5 years ago  Allergies [name], CMA; 12/11/2013 11:55 AM) Amoxicillin *PENICILLINS*  Medication History ([name], CMA; 12/11/2013 12:01 PM) LORazepam (0.5MG Tablet, Oral) Active. Anucort-HC (25MG Suppository, Rectal) Active. Prochlorperazine Maleate (10MG Tablet, Oral) Active. Dexamethasone (4MG Tablet, Oral) Active. Ondansetron HCl (8MG Tablet, Oral) Active. Lidocaine-Prilocaine (2.5-2.5% Cream, External as needed) Active. Claritin (5MG Tablet Chewable, Oral) Active.  Social History [name], [place]; 12/11/2013 11:53 AM) Alcohol use Occasional alcohol use. No drug use Tobacco use Former smoker.  Family History [name], [place]; 12/11/2013 11:53 AM) Arthritis Father. Colon Cancer Father. Depression Mother. Hypertension Mother. Ovarian Cancer Mother.  Pregnancy / Birth History [name], [place]; 12/11/2013 11:53 AM) Age at menarche 1 years. Contraceptive History Oral contraceptives. Gravida 0 Irregular periods Para 0     Review of Systems ([place]; 12/11/2013 11:53 AM) General Present- Appetite Loss and Fatigue. Not Present- Chills, Fever, Night Sweats, Weight Gain and Weight Loss. Skin Present- Dryness. Not Present- Change in Wart/Mole, Hives, Jaundice, New Lesions, Non-Healing Wounds, Rash and Ulcer. HEENT Present- Nose Bleed and Wears glasses/contact lenses. Not Present- Earache, Hearing Loss, Hoarseness, Oral Ulcers, Ringing in the Ears, Seasonal Allergies, Sinus  Pain, Sore Throat, Visual Disturbances and Yellow Eyes. Respiratory Present- Snoring. Not Present- Bloody sputum, Chronic Cough, Difficulty Breathing and Wheezing. Breast Present- Breast Mass. Not Present- Breast Pain, Nipple Discharge and Skin Changes. Cardiovascular Present- Rapid Heart Rate and Swelling of Extremities. Not Present- Chest Pain, Difficulty Breathing Lying Down, Leg Cramps, Palpitations and Shortness of Breath. Gastrointestinal Present- Bloody Stool, Change in Bowel Habits, Hemorrhoids, Nausea and Rectal Pain. Not Present- Abdominal Pain, Bloating, Chronic diarrhea, Constipation, Difficulty Swallowing, Excessive gas, Gets full quickly at meals, Indigestion and Vomiting. Female Genitourinary Present- Urgency. Not Present- Frequency, Nocturia, Painful Urination and Pelvic Pain. Neurological Present- Numbness and Tingling. Not Present- Decreased Memory, Fainting, Headaches, Seizures, Tremor, Trouble walking and Weakness. Psychiatric Not Present- Anxiety, Bipolar, Change in Sleep Pattern, Depression, Fearful and Frequent crying. Endocrine Present- Hair Changes and Hot flashes. Not Present- Cold Intolerance, Excessive Hunger, Heat Intolerance and New Diabetes. Hematology Not Present- Easy Bruising, Excessive bleeding, Gland problems, HIV and Persistent Infections.  Vitals ([name] CMA; 12/11/2013 11:59 AM) 12/11/2013 11:55 AM Weight: 309 lb Height: 65in Body Surface Area: 2.54 m Body Mass Index: 51.42 kg/m Temp.: 87F(Temporal)  Pulse: 81 (Regular)  BP: 132/80 (Sitting, Left Arm, Standard)     Physical Exam (Melton Walls A. Dellene Mcgroarty MD; 12/11/2013 12:32 PM)  General Mental Status-Alert. General Appearance-Consistent with stated age. Hydration-Well hydrated. Voice-Normal.  Head and Neck Head-normocephalic, atraumatic with no lesions or palpable masses. Trachea-midline. Thyroid Gland Characteristics - normal size and consistency.  Eye Eyeball -  Bilateral-Extraocular movements intact. Sclera/Conjunctiva - Bilateral-No scleral icterus.  Chest and Lung Exam Chest and lung exam reveals -quiet, even and easy respiratory effort with no use of accessory muscles and on auscultation, normal breath sounds, no adventitious sounds and normal vocal resonance. Inspection Chest Wall - Normal. Back - normal.  Breast Note: right breast mass not palpable. left breast normal   Musculoskeletal Normal Exam - Left-Upper Extremity Strength Normal and Lower Extremity Strength Normal. Normal Exam -  Right-Upper Extremity Strength Normal and Lower Extremity Strength Normal.  Lymphatic Axillary  General Axillary Region: Bilateral - Description - Normal. Tenderness - Non Tender.    Assessment & Plan (Jazzmin Newbold A. Coleson Kant MD; 12/11/2013 12:30 PM)  BREAST CANCER, RIGHT (174.9  C50.911) Impression: pt has completed chemotherapy and is ready for surgery. she is a good candidate for right breast lumpectomy and SLN mapping. Risk of lumpectomy include bleeding, infection, seroma, more surgery, use of seed/wire, wound care, cosmetic deformity and the need for other treatments, death , blood clots, death. Pt agrees to proceed. Risk of sentinel lymph node mapping include bleeding, infection, lymphedema, shoulder pain. stiffness, dye allergy. cosmetic deformity , blood clots, death, need for more surgery. Pt agres to proceed.  Current Plans Pt Education - [facility] Breast Biopsy Instructions (FLB): discussed with patient and provided information. Pt Education - [facility] Breast Biopsy HCI Schedule for Surgery [facility] Consent - BASIC ([place]ross): discussed with patient and provided information.

## 2013-12-18 ENCOUNTER — Other Ambulatory Visit (INDEPENDENT_AMBULATORY_CARE_PROVIDER_SITE_OTHER): Payer: Self-pay | Admitting: Surgery

## 2013-12-18 DIAGNOSIS — C50911 Malignant neoplasm of unspecified site of right female breast: Secondary | ICD-10-CM

## 2013-12-21 ENCOUNTER — Ambulatory Visit: Payer: 59

## 2013-12-21 ENCOUNTER — Telehealth: Payer: Self-pay | Admitting: Nurse Practitioner

## 2013-12-21 ENCOUNTER — Ambulatory Visit (HOSPITAL_BASED_OUTPATIENT_CLINIC_OR_DEPARTMENT_OTHER): Payer: 59 | Admitting: Nurse Practitioner

## 2013-12-21 ENCOUNTER — Encounter: Payer: Self-pay | Admitting: Nurse Practitioner

## 2013-12-21 ENCOUNTER — Ambulatory Visit: Payer: 59 | Admitting: Nutrition

## 2013-12-21 ENCOUNTER — Other Ambulatory Visit (HOSPITAL_BASED_OUTPATIENT_CLINIC_OR_DEPARTMENT_OTHER): Payer: 59

## 2013-12-21 ENCOUNTER — Ambulatory Visit (HOSPITAL_BASED_OUTPATIENT_CLINIC_OR_DEPARTMENT_OTHER): Payer: 59

## 2013-12-21 VITALS — BP 110/65 | HR 88 | Temp 98.7°F | Resp 19 | Ht 65.0 in | Wt 311.5 lb

## 2013-12-21 DIAGNOSIS — Z5112 Encounter for antineoplastic immunotherapy: Secondary | ICD-10-CM

## 2013-12-21 DIAGNOSIS — Z5111 Encounter for antineoplastic chemotherapy: Secondary | ICD-10-CM

## 2013-12-21 DIAGNOSIS — C50411 Malignant neoplasm of upper-outer quadrant of right female breast: Secondary | ICD-10-CM

## 2013-12-21 DIAGNOSIS — Z95828 Presence of other vascular implants and grafts: Secondary | ICD-10-CM

## 2013-12-21 DIAGNOSIS — Z170 Estrogen receptor positive status [ER+]: Secondary | ICD-10-CM

## 2013-12-21 LAB — COMPREHENSIVE METABOLIC PANEL (CC13)
ALT: 26 U/L (ref 0–55)
ANION GAP: 11 meq/L (ref 3–11)
AST: 16 U/L (ref 5–34)
Albumin: 3.4 g/dL — ABNORMAL LOW (ref 3.5–5.0)
Alkaline Phosphatase: 77 U/L (ref 40–150)
BILIRUBIN TOTAL: 0.21 mg/dL (ref 0.20–1.20)
BUN: 10.2 mg/dL (ref 7.0–26.0)
CO2: 22 meq/L (ref 22–29)
CREATININE: 0.9 mg/dL (ref 0.6–1.1)
Calcium: 9.6 mg/dL (ref 8.4–10.4)
Chloride: 106 mEq/L (ref 98–109)
GLUCOSE: 103 mg/dL (ref 70–140)
Potassium: 4.2 mEq/L (ref 3.5–5.1)
Sodium: 139 mEq/L (ref 136–145)
Total Protein: 6.7 g/dL (ref 6.4–8.3)

## 2013-12-21 LAB — CBC WITH DIFFERENTIAL/PLATELET
BASO%: 0.4 % (ref 0.0–2.0)
BASOS ABS: 0 10*3/uL (ref 0.0–0.1)
EOS%: 0 % (ref 0.0–7.0)
Eosinophils Absolute: 0 10*3/uL (ref 0.0–0.5)
HEMATOCRIT: 31.4 % — AB (ref 34.8–46.6)
HGB: 9.9 g/dL — ABNORMAL LOW (ref 11.6–15.9)
LYMPH%: 24.9 % (ref 14.0–49.7)
MCH: 27.5 pg (ref 25.1–34.0)
MCHC: 31.5 g/dL (ref 31.5–36.0)
MCV: 87.1 fL (ref 79.5–101.0)
MONO#: 0.7 10*3/uL (ref 0.1–0.9)
MONO%: 8.5 % (ref 0.0–14.0)
NEUT#: 5.4 10*3/uL (ref 1.5–6.5)
NEUT%: 66.2 % (ref 38.4–76.8)
PLATELETS: 329 10*3/uL (ref 145–400)
RBC: 3.6 10*6/uL — ABNORMAL LOW (ref 3.70–5.45)
RDW: 20.3 % — ABNORMAL HIGH (ref 11.2–14.5)
WBC: 8.2 10*3/uL (ref 3.9–10.3)
lymph#: 2 10*3/uL (ref 0.9–3.3)

## 2013-12-21 MED ORDER — DEXAMETHASONE SODIUM PHOSPHATE 20 MG/5ML IJ SOLN
20.0000 mg | Freq: Once | INTRAMUSCULAR | Status: AC
  Administered 2013-12-21: 20 mg via INTRAVENOUS

## 2013-12-21 MED ORDER — DIPHENHYDRAMINE HCL 25 MG PO CAPS
25.0000 mg | ORAL_CAPSULE | Freq: Once | ORAL | Status: AC
  Administered 2013-12-21: 25 mg via ORAL

## 2013-12-21 MED ORDER — TRASTUZUMAB CHEMO INJECTION 440 MG
6.0000 mg/kg | Freq: Once | INTRAVENOUS | Status: AC
  Administered 2013-12-21: 861 mg via INTRAVENOUS
  Filled 2013-12-21: qty 41

## 2013-12-21 MED ORDER — SODIUM CHLORIDE 0.9 % IV SOLN
Freq: Once | INTRAVENOUS | Status: AC
  Administered 2013-12-21: 11:00:00 via INTRAVENOUS

## 2013-12-21 MED ORDER — ACETAMINOPHEN 160 MG/5ML PO SOLN
650.0000 mg | Freq: Once | ORAL | Status: AC
  Administered 2013-12-21: 650 mg via ORAL
  Filled 2013-12-21: qty 20.3

## 2013-12-21 MED ORDER — SODIUM CHLORIDE 0.9 % IJ SOLN
10.0000 mL | INTRAMUSCULAR | Status: DC | PRN
  Administered 2013-12-21: 10 mL
  Filled 2013-12-21: qty 10

## 2013-12-21 MED ORDER — ONDANSETRON 16 MG/50ML IVPB (CHCC)
16.0000 mg | Freq: Once | INTRAVENOUS | Status: AC
  Administered 2013-12-21: 16 mg via INTRAVENOUS

## 2013-12-21 MED ORDER — DIPHENHYDRAMINE HCL 25 MG PO CAPS
ORAL_CAPSULE | ORAL | Status: AC
  Filled 2013-12-21: qty 1

## 2013-12-21 MED ORDER — ONDANSETRON 16 MG/50ML IVPB (CHCC)
INTRAVENOUS | Status: AC
  Filled 2013-12-21: qty 16

## 2013-12-21 MED ORDER — SODIUM CHLORIDE 0.9 % IV SOLN
500.0000 mg/m2 | Freq: Once | INTRAVENOUS | Status: AC
  Administered 2013-12-21: 1280 mg via INTRAVENOUS
  Filled 2013-12-21: qty 64

## 2013-12-21 MED ORDER — ACETAMINOPHEN 325 MG PO TABS
ORAL_TABLET | ORAL | Status: AC
  Filled 2013-12-21: qty 2

## 2013-12-21 MED ORDER — HEPARIN SOD (PORK) LOCK FLUSH 100 UNIT/ML IV SOLN
500.0000 [IU] | Freq: Once | INTRAVENOUS | Status: AC | PRN
  Administered 2013-12-21: 500 [IU]
  Filled 2013-12-21: qty 5

## 2013-12-21 MED ORDER — DEXAMETHASONE SODIUM PHOSPHATE 20 MG/5ML IJ SOLN
INTRAMUSCULAR | Status: AC
  Filled 2013-12-21: qty 5

## 2013-12-21 MED ORDER — SODIUM CHLORIDE 0.9 % IJ SOLN
10.0000 mL | INTRAMUSCULAR | Status: DC | PRN
  Administered 2013-12-21: 10 mL via INTRAVENOUS
  Filled 2013-12-21: qty 10

## 2013-12-21 NOTE — Telephone Encounter (Signed)
per pof to sch pt appt-sent MW email to sch pt trmt-sent Vaughan Basta email to pre-cert ECHO-Will call pt after reply

## 2013-12-21 NOTE — Addendum Note (Signed)
Addended by: Marcelino Duster on: 12/21/2013 02:24 PM   Modules accepted: Orders

## 2013-12-21 NOTE — Progress Notes (Signed)
[place]  [phone] [phone]) [phone]     ID: [name] DOB: [birth year]  MR#: [id number]  KDX#:[id number]  Patient Care Team: Horatio Pel, MD as PCP - General (Internal Medicine) Erroll Luna, MD as Consulting Physician (General Surgery) Chauncey Cruel, MD as Consulting Physician (Oncology) Allena Katz, MD as Consulting Physician (Obstetrics and Gynecology)  CHIEF COMPLAINT: Newly diagnosed triple positive breast cancer  CURRENT TREATMENT: neoadjuvant chemotherapy  BREAST CANCER HISTORY: From the original intake note:  The patient herself palpated a mass in her right breast mid July. She brought it to Dr. Sherlynn Stalls attention and he set the patient up for bilateral diagnostic mammography and right breast ultrasonography of the breast Center 07/28/2013. Right mammogram showed an irregular mass in the upper outer quadrant measuring 1.4 cm. There was some associated pleomorphic microcalcifications. This was palpable, at the 10:30 position 11 cm from the nipple. By palpation it measured approximately 1.5 cm. Ultrasound confirmed an hypoechoic mass with irregular borders measuring 1.1 cm. The right axilla showed a few adjacent deep lymph nodes with mild cortical thickening.  On 08/08/2013 the patient underwent biopsy of the breast mass in question. The pathology ([id number]) showed an invasive ductal carcinoma, grade 2, estrogen receptor 49% positive, progesterone receptor 47% positive, both with moderate staining intensity, with an MIB-1 of 52%, and HER-2 amplification with a signals ratio of 8.1. The right axillary lymph node biopsied at the same time was benign this was felt to be possibly concordant.  And 08/15/2013 the patient underwent bilateral breast MRI showing a 2 cm irregular enhancing mass in the upper outer quadrant of the right breast. There were no additional changes no abnormal appearing lymph nodes and no masses or  abnormalities in the left breast.  The patient's subsequent history is as detailed below  INTERVAL HISTORY: [name] returns today for follow up of her breast cancer, accompanied by her mother. Today is day 1, cycle 6 of cyclophosphamide and herceptin, with neulasta on day 2 for graulocyte support. The cyclophosphamide is replacing the carboplatin because of hypersensitivity concerns and the docetaxel was removed because of persistent neuropathy. The numbness to her fingertips is still present but resolving slowly.   REVIEW OF SYSTEMS: [name] denies fevers, chills, nausea, or vomiting. She is managing her irregular bowel habits on her own. Her appetite is fair, though she can not taste much. She denies mouth sores or rashes. She has no shortness of breath, chest pain, cough, or palpitations. Her energy level seems to decrease with every cycle of chemo. A detailed review of systems is otherwise negative.   PAST MEDICAL HISTORY: Past Medical History  Diagnosis Date  . Anxiety   . Wears contact lenses   . Snores   . Malignant neoplasm of breast (female), unspecified site     PAST SURGICAL HISTORY: Past Surgical History  Procedure Laterality Date  . Wisdom tooth extraction    . Portacath placement N/A 08/23/2013    Procedure: INSERTION PORT-A-CATH WITH ULTRA SOUND;  Surgeon: Joyice Faster. Cornett, MD;  Location: [facility];  Service: General;  Laterality: N/A;    FAMILY HISTORY Family History  Problem Relation Age of Onset  . Uterine cancer Mother 58    TAH/BSO  . Colon cancer Father 61    deceased 64  . Leukemia Maternal Grandmother     deceased 60  . Skin cancer Paternal Grandmother   . Prostate cancer Other    the patient's father died from  colon cancer the age of 12. It had been diagnosed 3 years prior. The patient's mother was diagnosed with cancer of the uterus at age 38. She is 44 years old as of August 2015. The patient had no brothers or sisters. There is a history  of prostate cancer leukemia at in the family as well as melanoma, but there is no history of breast or ovarian cancer in the family.  GYNECOLOGIC HISTORY:  No LMP recorded. Menarche age 64. The patient is GX P0. She is still having regular periods. 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 She lives alone, with no pets.    ADVANCED DIRECTIVES: Not in place. At her 08/16/2013 visit the patient was given the upper. Documents 2 complete and notarize so she may name her mother, [name] focal as her healthcare power of attorney, which is the patient's intention. Ms. focal can be reached at [phone]. She currently lives in [place] but is planning to move to [place]: History  Substance Use Topics  . Smoking status: Former Smoker    Quit date: 02/20/2013  . Smokeless tobacco: Not on file  . Alcohol Use: Yes     Comment: 1/week     Colonoscopy: Never  PAP: July 2015 Bone density: Never  Lipid panel:  Allergies  Allergen Reactions  . Amoxicillin Rash    Current Outpatient Prescriptions  Medication Sig Dispense Refill  . acetaminophen (TYLENOL) 160 MG/5ML liquid Take 20.3 mLs (650 mg total) by mouth once. 120 mL 0  . Calcium-Vitamin D-Vitamin K (CALCIUM SOFT CHEWS PO) Take by mouth daily.    Marland Kitchen dexamethasone (DECADRON) 4 MG tablet Take 2 tablets (8 mg total) by mouth 2 (two) times daily. Start the day before Taxotere. Then again the day after chemo for 3 days. 30 tablet 1  . hydrocortisone (ANUSOL-HC) 25 MG suppository Place 1 suppository (25 mg total) rectally 2 (two) times daily. 12 suppository 1  . lidocaine-prilocaine (EMLA) cream Apply 1 application topically as needed. Apply over port site 1-2 hours before chemo, then cove with plastic wrap 30 g 0  . loratadine (CLARITIN) 10 MG tablet Take 10 mg by mouth daily as needed for  allergies.    Marland Kitchen LORazepam (ATIVAN) 0.5 MG tablet TAKE 1 TABLET BY MOUTH AT BEDTIME AS NEEDED (NAUSEA OR VOMITING) 30 tablet 0  . Multiple Vitamins-Minerals (MULTI ADULT GUMMIES PO) Take 2 each by mouth daily.    . ondansetron (ZOFRAN) 8 MG tablet Take 1 tablet (8 mg total) by mouth 2 (two) times daily. Start the day after chemo for 3 days. Then take as needed for nausea or vomiting. 30 tablet 1  . prochlorperazine (COMPAZINE) 10 MG tablet Take 1 tablet (10 mg total) by mouth every 6 (six) hours as needed (Nausea or vomiting). 30 tablet 1  . UNABLE TO FIND Apply 1 each topically once. Med Name: Dispense per medical necessity cranial prothesis for this patient with alopecia secondary to chemotherapy due to breast cancer history. 1 each 0  . cholestyramine (QUESTRAN) 4 G packet Take 1 packet (4 g total) by mouth 3 (three) times daily with meals. (Patient not taking: Reported on 12/06/2013) 60 each 12  . oxyCODONE-acetaminophen (ROXICET) 5-325 MG per tablet Take 1 tablet by mouth every 4 (four) hours as needed. (Patient not taking: Reported on 12/21/2013) 30 tablet 0  No current facility-administered medications for this visit.   Facility-Administered Medications Ordered in Other Visits  Medication Dose Route Frequency Provider Last Rate Last Dose  . clindamycin (CLEOCIN) 600 mg in dextrose 5 % 50 mL IVPB  600 mg Intravenous Once Erroll Luna, MD      . sodium chloride 0.9 % injection 10 mL  10 mL Intravenous PRN Chauncey Cruel, MD   10 mL at 11/30/13 1102    OBJECTIVE: Middle-aged [place] American woman who appears stated age 59 Vitals:   12/21/13 1107  BP: 110/65  Pulse: 88  Temp: 98.7 F (37.1 C)  Resp: 19     Body mass index is 51.84 kg/(m^2).     ECOG FS:1 - Symptomatic but completely ambulatory   Sclerae unicteric, pupils equal and reactive Oropharynx clear and moist-- no thrush No cervical or supraclavicular adenopathy Lungs no rales or rhonchi Heart regular rate and  rhythm Abd soft, nontender, positive bowel sounds MSK no focal spinal tenderness, no upper extremity lymphedema Neuro: nonfocal, well oriented, appropriate affect Breasts: deferred  LAB RESULTS:  CMP     Component Value Date/Time   NA 139 12/21/2013 1037   K 4.2 12/21/2013 1037   CO2 22 12/21/2013 1037   GLUCOSE 103 12/21/2013 1037   BUN 10.2 12/21/2013 1037   CREATININE 0.9 12/21/2013 1037   CALCIUM 9.6 12/21/2013 1037   PROT 6.7 12/21/2013 1037   ALBUMIN 3.4* 12/21/2013 1037   AST 16 12/21/2013 1037   ALT 26 12/21/2013 1037   ALKPHOS 77 12/21/2013 1037   BILITOT 0.21 12/21/2013 1037    I No results found for: SPEP  Lab Results  Component Value Date   WBC 8.2 12/21/2013   NEUTROABS 5.4 12/21/2013   HGB 9.9* 12/21/2013   HCT 31.4* 12/21/2013   MCV 87.1 12/21/2013   PLT 329 12/21/2013      Chemistry      Component Value Date/Time   NA 139 12/21/2013 1037   K 4.2 12/21/2013 1037   CO2 22 12/21/2013 1037   BUN 10.2 12/21/2013 1037   CREATININE 0.9 12/21/2013 1037      Component Value Date/Time   CALCIUM 9.6 12/21/2013 1037   ALKPHOS 77 12/21/2013 1037   AST 16 12/21/2013 1037   ALT 26 12/21/2013 1037   BILITOT 0.21 12/21/2013 1037       No results found for: LABCA2  No components found for: LABCA125  No results for input(s): INR in the last 168 hours.  Urinalysis No results found for: COLORURINE  STUDIES: Most recent echocardiogram on 11/28/13 showed an ejection fraction of 65-70%.  ASSESSMENT: 43 y.o. BRCA negative [place] woman status post right breast upper outer quadrant biopsy 08/08/2013 for a triple positive invasive ductal carcinoma, grade 2, with an MIB-1 of 52%, and a suspicious lymph node biopsied at the same time pathologically benign.  (1) genetics testing found no mutations in ATM, BARD1, BRCA1, BRCA2, BRIP1, CDH1, CHEK2, MRE11A, MUTYH, NBN, NF1, PALB2, PTEN, RAD50, RAD51C, RAD51D, and TP53  (2) neoadjuvant chemotherapy to consist  of carboplatin, docetaxel, trastuzumab and pertuzumab for 6 cycles, starting 09/07/2013,  (a) pertuzumab discontinued after cycle 1 because of severe diarrhea  (b) carboplatin switched for cyclophosphamide because of hypersensitivity reaction on 11/20/13  (c) docetaxel removed from final cycle because of peripheral neuropathy symptoms.  (3) trastuzumab to continue to complete one year (through August 2016); Echocardiogram on 08/25/13 demonstrated a LVEF of 60-65%.    (4) definitive surgery to follow chemotherapy  (  5) adjuvant radiation to follow surgery  (6) anti-estrogens to follow radiation.  PLAN: [name] is doing well today. The labs were reviewed in detail and were relatively stable. She continues to demonstrate treatment related anemia with a hgb down to 9.9. She is asymptomatic at this time so we will continue to monitor this value. We will proceed with her 6th and final cycle, consisting of cyclophosphamide and trastuzumab alone today.   [name] met with Dr. Brantley Stage at the end of last month. She is scheduled for a lumpectomy on 1/14. Her next echocardiogram is also due that month. [name] will continue with trastuzumab every 3 weeks, next due on 12/30. She understands and agrees with this plan. She knows the goal of treatment in her case is cure. She has been encouraged to call with any issues that might arise before her next visit here.   Marcelino Duster, NP 12/21/2013 11:33 AM

## 2013-12-21 NOTE — Patient Instructions (Signed)

## 2013-12-21 NOTE — Progress Notes (Signed)
43 year old female diagnosed with breast cancer.  She is patient of Dr. Jana Hakim.  Past medical history includes anxiety.  Medications include calcium/vitamin D, Decadron, Ativan, multivitamin, Zofran, and Compazine.  Labs include potassium 3.4, and albumin 3.4.  Height: 65 inches. Weight:311.5 pounds. Usual body weight: 315 pounds. BMI: 51.84.  Patient reports appetite comes and goes.   She experiences increased appetite when on steroids.   Weight is stable.  Patient has morbid obesity with BMI 51.84. Patient reports nausea associated with chemotherapy which improves with nausea medication. Patient has alternating constipation and diarrhea. Patient reports, anemia.  Nutrition diagnosis: Food and nutrition related knowledge deficit related to breast cancer and associated treatments as evidenced by no prior need for nutrition related information.  Intervention: Patient educated to consume higher protein, lower fat foods. Encouraged compliance with nausea medications as prescribed.   Educated patient on strategies for diarrhea and constipation. Educated patient on foods high in iron and methods for increasing iron absorption. Fact sheets were provided. Questions answered.  Teach back method used.  Contact information was given.  Monitoring, evaluation, goals: Patient will tolerate adequate calories and protein to promote maintenance of lean body mass.  Next visit: Patient to contact me for questions.  **Disclaimer: This note was dictated with voice recognition software. Similar sounding words can inadvertently be transcribed and this note may contain transcription errors which may not have been corrected upon publication of note.**

## 2013-12-21 NOTE — Patient Instructions (Signed)
[facility] Discharge Instructions for Patients Receiving Chemotherapy  Today you received the following chemotherapy agents Cytoxan, herceptin.   To help prevent nausea and vomiting after your treatment, we encourage you to take your nausea medication Compazine, zofran, lorazepam.   If you develop nausea and vomiting that is not controlled by your nausea medication, call the clinic.   BELOW ARE SYMPTOMS THAT SHOULD BE REPORTED IMMEDIATELY:  *FEVER GREATER THAN 100.5 F  *CHILLS WITH OR WITHOUT FEVER  NAUSEA AND VOMITING THAT IS NOT CONTROLLED WITH YOUR NAUSEA MEDICATION  *UNUSUAL SHORTNESS OF BREATH  *UNUSUAL BRUISING OR BLEEDING  TENDERNESS IN MOUTH AND THROAT WITH OR WITHOUT PRESENCE OF ULCERS  *URINARY PROBLEMS  *BOWEL PROBLEMS  UNUSUAL RASH Items with * indicate a potential emergency and should be followed up as soon as possible.  Feel free to call the clinic you have any questions or concerns. The clinic phone number is [phone].

## 2013-12-22 ENCOUNTER — Telehealth: Payer: Self-pay

## 2013-12-22 ENCOUNTER — Ambulatory Visit (HOSPITAL_BASED_OUTPATIENT_CLINIC_OR_DEPARTMENT_OTHER): Payer: 59

## 2013-12-22 DIAGNOSIS — Z5189 Encounter for other specified aftercare: Secondary | ICD-10-CM

## 2013-12-22 DIAGNOSIS — C50411 Malignant neoplasm of upper-outer quadrant of right female breast: Secondary | ICD-10-CM

## 2013-12-22 MED ORDER — PEGFILGRASTIM INJECTION 6 MG/0.6ML ~~LOC~~
6.0000 mg | PREFILLED_SYRINGE | Freq: Once | SUBCUTANEOUS | Status: AC
  Administered 2013-12-22: 6 mg via SUBCUTANEOUS
  Filled 2013-12-22: qty 0.6

## 2013-12-22 NOTE — Telephone Encounter (Signed)
Ms. Roesler stated that she is doing fine after receiving the cytoxan yesterday.  No nausea or vomiting. Pt. sightly constipated as she usually is after a treatment for a few days.  Patient states that she does not use laxatives or stool softeners as she tends to get diarrhea using them.   Ms. Gottsch is drinking ~64 oz of fluid a day. She know to call the office if any issues/concerns arise. Patient appreciated the follow up call.

## 2013-12-22 NOTE — Telephone Encounter (Signed)
-----   Message from [place], RN sent at 12/21/2013  5:57 PM EST ----- Regarding: ChemoFollow up Cytoxan  added to regimen to replace carboplatin after reaction to [place], Injection scheduled

## 2013-12-22 NOTE — Patient Instructions (Signed)
Pegfilgrastim injection What is this medicine? PEGFILGRASTIM (peg fil GRA stim) is a long-acting granulocyte colony-stimulating factor that stimulates the growth of neutrophils, a type of white blood cell important in the body's fight against infection. It is used to reduce the incidence of fever and infection in patients with certain types of cancer who are receiving chemotherapy that affects the bone marrow. This medicine may be used for other purposes; ask your health care provider or pharmacist if you have questions. COMMON BRAND NAME(S): Neulasta What should I tell my health care provider before I take this medicine? They need to know if you have any of these conditions: -latex allergy -ongoing radiation therapy -sickle cell disease -skin reactions to acrylic adhesives (On-Body Injector only) -an unusual or allergic reaction to pegfilgrastim, filgrastim, other medicines, foods, dyes, or preservatives -pregnant or trying to get pregnant -breast-feeding How should I use this medicine? This medicine is for injection under the skin. If you get this medicine at home, you will be taught how to prepare and give the pre-filled syringe or how to use the On-body Injector. Refer to the patient Instructions for Use for detailed instructions. Use exactly as directed. Take your medicine at regular intervals. Do not take your medicine more often than directed. It is important that you put your used needles and syringes in a special sharps container. Do not put them in a trash can. If you do not have a sharps container, call your pharmacist or healthcare provider to get one. Talk to your pediatrician regarding the use of this medicine in children. Special care may be needed. Overdosage: If you think you have taken too much of this medicine contact a poison control center or emergency room at once. NOTE: This medicine is only for you. Do not share this medicine with others. What if I miss a dose? It is  important not to miss your dose. Call your doctor or health care professional if you miss your dose. If you miss a dose due to an On-body Injector failure or leakage, a new dose should be administered as soon as possible using a single prefilled syringe for manual use. What may interact with this medicine? Interactions have not been studied. Give your health care provider a list of all the medicines, herbs, non-prescription drugs, or dietary supplements you use. Also tell them if you smoke, drink alcohol, or use illegal drugs. Some items may interact with your medicine. This list may not describe all possible interactions. Give your health care provider a list of all the medicines, herbs, non-prescription drugs, or dietary supplements you use. Also tell them if you smoke, drink alcohol, or use illegal drugs. Some items may interact with your medicine. What should I watch for while using this medicine? You may need blood work done while you are taking this medicine. If you are going to need a MRI, CT scan, or other procedure, tell your doctor that you are using this medicine (On-Body Injector only). What side effects may I notice from receiving this medicine? Side effects that you should report to your doctor or health care professional as soon as possible: -allergic reactions like skin rash, itching or hives, swelling of the face, lips, or tongue -dizziness -fever -pain, redness, or irritation at site where injected -pinpoint red spots on the skin -shortness of breath or breathing problems -stomach or side pain, or pain at the shoulder -swelling -tiredness -trouble passing urine Side effects that usually do not require medical attention (report to your doctor   or health care professional if they continue or are bothersome): -bone pain -muscle pain This list may not describe all possible side effects. Call your doctor for medical advice about side effects. You may report side effects to FDA at  1-800-FDA-1088. Where should I keep my medicine? Keep out of the reach of children. Store pre-filled syringes in a refrigerator between 2 and 8 degrees C (36 and 46 degrees F). Do not freeze. Keep in carton to protect from light. Throw away this medicine if it is left out of the refrigerator for more than 48 hours. Throw away any unused medicine after the expiration date. NOTE: This sheet is a summary. It may not cover all possible information. If you have questions about this medicine, talk to your doctor, pharmacist, or health care provider.  2015, Elsevier/Gold Standard. (2013-03-30 16:14:05)  

## 2013-12-25 ENCOUNTER — Encounter: Payer: Self-pay | Admitting: Oncology

## 2013-12-25 NOTE — Progress Notes (Signed)
Insurance is paying [id number] neulasta ° °

## 2013-12-27 ENCOUNTER — Other Ambulatory Visit (HOSPITAL_BASED_OUTPATIENT_CLINIC_OR_DEPARTMENT_OTHER): Payer: 59

## 2013-12-27 ENCOUNTER — Telehealth: Payer: Self-pay | Admitting: Nurse Practitioner

## 2013-12-27 ENCOUNTER — Encounter: Payer: Self-pay | Admitting: Nurse Practitioner

## 2013-12-27 ENCOUNTER — Ambulatory Visit (HOSPITAL_BASED_OUTPATIENT_CLINIC_OR_DEPARTMENT_OTHER): Payer: 59 | Admitting: Nurse Practitioner

## 2013-12-27 DIAGNOSIS — Z170 Estrogen receptor positive status [ER+]: Secondary | ICD-10-CM

## 2013-12-27 DIAGNOSIS — C50411 Malignant neoplasm of upper-outer quadrant of right female breast: Secondary | ICD-10-CM

## 2013-12-27 LAB — CBC WITH DIFFERENTIAL/PLATELET
BASO%: 0.2 % (ref 0.0–2.0)
Basophils Absolute: 0.1 10*3/uL (ref 0.0–0.1)
EOS ABS: 0.2 10*3/uL (ref 0.0–0.5)
EOS%: 0.7 % (ref 0.0–7.0)
HEMATOCRIT: 33.2 % — AB (ref 34.8–46.6)
HGB: 10.7 g/dL — ABNORMAL LOW (ref 11.6–15.9)
LYMPH#: 6.6 10*3/uL — AB (ref 0.9–3.3)
LYMPH%: 25.7 % (ref 14.0–49.7)
MCH: 28.2 pg (ref 25.1–34.0)
MCHC: 32.2 g/dL (ref 31.5–36.0)
MCV: 87.4 fL (ref 79.5–101.0)
MONO#: 1.3 10*3/uL — AB (ref 0.1–0.9)
MONO%: 5.2 % (ref 0.0–14.0)
NEUT%: 68.2 % (ref 38.4–76.8)
NEUTROS ABS: 17.5 10*3/uL — AB (ref 1.5–6.5)
NRBC: 1 % — AB (ref 0–0)
Platelets: 240 10*3/uL (ref 145–400)
RBC: 3.8 10*6/uL (ref 3.70–5.45)
RDW: 19.7 % — ABNORMAL HIGH (ref 11.2–14.5)
WBC: 25.6 10*3/uL — AB (ref 3.9–10.3)

## 2013-12-27 LAB — COMPREHENSIVE METABOLIC PANEL (CC13)
ALT: 17 U/L (ref 0–55)
ANION GAP: 13 meq/L — AB (ref 3–11)
AST: 10 U/L (ref 5–34)
Albumin: 3.2 g/dL — ABNORMAL LOW (ref 3.5–5.0)
Alkaline Phosphatase: 127 U/L (ref 40–150)
BUN: 16.8 mg/dL (ref 7.0–26.0)
CALCIUM: 8.5 mg/dL (ref 8.4–10.4)
CHLORIDE: 105 meq/L (ref 98–109)
CO2: 21 meq/L — AB (ref 22–29)
CREATININE: 1 mg/dL (ref 0.6–1.1)
EGFR: 84 mL/min/{1.73_m2} — ABNORMAL LOW (ref >90)
Glucose: 97 mg/dl (ref 70–140)
POTASSIUM: 3.7 meq/L (ref 3.5–5.1)
Sodium: 140 mEq/L (ref 136–145)
Total Bilirubin: 0.38 mg/dL (ref 0.20–1.20)
Total Protein: 6.3 g/dL — ABNORMAL LOW (ref 6.4–8.3)

## 2013-12-27 NOTE — Progress Notes (Signed)
[facility]  Telephone:(336) [phone] Fax:(336) [phone]     ID: [name] DOB: [birth year]  MR#: [id number]  NFA#:[id number]  Patient Care Team: Horatio Pel, MD as PCP - General (Internal Medicine) Erroll Luna, MD as Consulting Physician (General Surgery) Chauncey Cruel, MD as Consulting Physician (Oncology) Allena Katz, MD as Consulting Physician (Obstetrics and Gynecology)  CHIEF COMPLAINT: Newly diagnosed triple positive breast cancer  CURRENT TREATMENT: neoadjuvant chemotherapy  BREAST CANCER HISTORY: From the original intake note:  The patient herself palpated a mass in her right breast mid July. She brought it to Dr. Sherlynn Stalls attention and he set the patient up for bilateral diagnostic mammography and right breast ultrasonography of the breast Center 07/28/2013. Right mammogram showed an irregular mass in the upper outer quadrant measuring 1.4 cm. There was some associated pleomorphic microcalcifications. This was palpable, at the 10:30 position 11 cm from the nipple. By palpation it measured approximately 1.5 cm. Ultrasound confirmed an hypoechoic mass with irregular borders measuring 1.1 cm. The right axilla showed a few adjacent deep lymph nodes with mild cortical thickening.  On 08/08/2013 the patient underwent biopsy of the breast mass in question. The pathology ([id number]) showed an invasive ductal carcinoma, grade 2, estrogen receptor 49% positive, progesterone receptor 44% positive, both with moderate staining intensity, with an MIB-1 of 52%, and HER-2 amplification with a signals ratio of 8.1. The right axillary lymph node biopsied at the same time was benign this was felt to be possibly concordant.  And 08/15/2013 the patient underwent bilateral breast MRI showing a 2 cm irregular enhancing mass in the upper outer quadrant of the right breast. There were no additional changes no abnormal appearing lymph nodes and no masses or  abnormalities in the left breast.  The patient's subsequent history is as detailed below  INTERVAL HISTORY: [name] returns today for follow up of her breast cancer, accompanied by her mother. Today is day 8, cycle 6 of cyclophosphamide and herceptin, with neulasta on day 2 for graulocyte support. The cyclophosphamide is replacing the carboplatin because of hypersensitivity concerns and the docetaxel was removed because of persistent neuropathy. The numbness to her fingertips is somewhat resolving. She feels more tingling this week.   REVIEW OF SYSTEMS: Taziyah denies fevers, chills, nausea, or vomiting, or changes in bowel or bladder habits. Her appetite has improved and is tasting better this week. She denies mouth sores or rashes. She has no shortness of breath, chest pain, cough, or palpitations. Her energy level has improved somewhat.  A detailed review of systems is otherwise negative.   PAST MEDICAL HISTORY: Past Medical History  Diagnosis Date  . Anxiety   . Wears contact lenses   . Snores   . Malignant neoplasm of breast (female), unspecified site     PAST SURGICAL HISTORY: Past Surgical History  Procedure Laterality Date  . Wisdom tooth extraction    . Portacath placement N/A 08/23/2013    Procedure: INSERTION PORT-A-CATH WITH ULTRA SOUND;  Surgeon: Joyice Faster. Cornett, MD;  Location: [facility];  Service: General;  Laterality: N/A;    FAMILY HISTORY Family History  Problem Relation Age of Onset  . Uterine cancer Mother 49    TAH/BSO  . Colon cancer Father 78    deceased 46  . Leukemia Maternal Grandmother     deceased 40  . Skin cancer Paternal Grandmother   . Prostate cancer Other    the patient's father died from colon cancer the age  of 71. It had been diagnosed 3 years prior. The patient's mother was diagnosed with cancer of the uterus at age 33. She is 44 years old as of August 2015. The patient had no brothers or sisters. There is a history of prostate  cancer leukemia at in the family as well as melanoma, but there is no history of breast or ovarian cancer in the family.  GYNECOLOGIC HISTORY:  No LMP recorded. Menarche age 38. The patient is GX P0. She is still having regular periods. 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 She lives alone, with no pets.    ADVANCED DIRECTIVES: Not in place. At her 08/16/2013 visit the patient was given the upper. Documents 2 complete and notarize so she may name her mother, [name] focal as her healthcare power of attorney, which is the patient's intention. Ms. focal can be reached at [phone]. She currently lives in [place] but is planning to move to [place]: History  Substance Use Topics  . Smoking status: Former Smoker    Quit date: 02/20/2013  . Smokeless tobacco: Not on file  . Alcohol Use: Yes     Comment: 1/week     Colonoscopy: Never  PAP: July 2015 Bone density: Never  Lipid panel:  Allergies  Allergen Reactions  . Amoxicillin Rash    Current Outpatient Prescriptions  Medication Sig Dispense Refill  . acetaminophen (TYLENOL) 160 MG/5ML liquid Take 20.3 mLs (650 mg total) by mouth once. 120 mL 0  . Calcium-Vitamin D-Vitamin K (CALCIUM SOFT CHEWS PO) Take by mouth daily.    . cholestyramine (QUESTRAN) 4 G packet Take 1 packet (4 g total) by mouth 3 (three) times daily with meals. (Patient not taking: Reported on 12/06/2013) 60 each 12  . dexamethasone (DECADRON) 4 MG tablet Take 2 tablets (8 mg total) by mouth 2 (two) times daily. Start the day before Taxotere. Then again the day after chemo for 3 days. 30 tablet 1  . hydrocortisone (ANUSOL-HC) 25 MG suppository Place 1 suppository (25 mg total) rectally 2 (two) times daily. 12 suppository 1  . lidocaine-prilocaine (EMLA) cream Apply 1 application topically as needed. Apply over  port site 1-2 hours before chemo, then cove with plastic wrap 30 g 0  . loratadine (CLARITIN) 10 MG tablet Take 10 mg by mouth daily as needed for allergies.    Marland Kitchen LORazepam (ATIVAN) 0.5 MG tablet TAKE 1 TABLET BY MOUTH AT BEDTIME AS NEEDED (NAUSEA OR VOMITING) 30 tablet 0  . Multiple Vitamins-Minerals (MULTI ADULT GUMMIES PO) Take 2 each by mouth daily.    . ondansetron (ZOFRAN) 8 MG tablet Take 1 tablet (8 mg total) by mouth 2 (two) times daily. Start the day after chemo for 3 days. Then take as needed for nausea or vomiting. 30 tablet 1  . oxyCODONE-acetaminophen (ROXICET) 5-325 MG per tablet Take 1 tablet by mouth every 4 (four) hours as needed. (Patient not taking: Reported on 12/21/2013) 30 tablet 0  . prochlorperazine (COMPAZINE) 10 MG tablet Take 1 tablet (10 mg total) by mouth every 6 (six) hours as needed (Nausea or vomiting). 30 tablet 1  . UNABLE TO FIND Apply 1 each topically once. Med Name: Dispense per medical necessity cranial prothesis for this patient with alopecia secondary to chemotherapy due to breast cancer history. 1 each 0   No current facility-administered medications  for this visit.   Facility-Administered Medications Ordered in Other Visits  Medication Dose Route Frequency Provider Last Rate Last Dose  . clindamycin (CLEOCIN) 600 mg in dextrose 5 % 50 mL IVPB  600 mg Intravenous Once Erroll Luna, MD      . sodium chloride 0.9 % injection 10 mL  10 mL Intravenous PRN Chauncey Cruel, MD   10 mL at 11/30/13 1102    OBJECTIVE: Middle-aged [place] American woman who appears stated age There were no vitals filed for this visit.   There is no weight on file to calculate BMI.     ECOG FS:1 - Symptomatic but completely ambulatory  Skin: warm, dry  HEENT: sclerae anicteric, conjunctivae pink, oropharynx clear. No thrush or mucositis.  Lymph Nodes: No cervical or supraclavicular lymphadenopathy  Lungs: clear to auscultation bilaterally, no rales, wheezes, or rhonci    Heart: regular rate and rhythm  Abdomen: round, soft, non tender, positive bowel sounds  Musculoskeletal: No focal spinal tenderness, no peripheral edema  Neuro: non focal, well oriented, positive affect  Breasts: left breast unremarkable. Right breast mass to upper outer quadrant difficult to find an palpate. No skin or nipple changes. Right axilla benign.   LAB RESULTS:  CMP     Component Value Date/Time   NA 139 12/21/2013 1037   K 4.2 12/21/2013 1037   CO2 22 12/21/2013 1037   GLUCOSE 103 12/21/2013 1037   BUN 10.2 12/21/2013 1037   CREATININE 0.9 12/21/2013 1037   CALCIUM 9.6 12/21/2013 1037   PROT 6.7 12/21/2013 1037   ALBUMIN 3.4* 12/21/2013 1037   AST 16 12/21/2013 1037   ALT 26 12/21/2013 1037   ALKPHOS 77 12/21/2013 1037   BILITOT 0.21 12/21/2013 1037    I No results found for: SPEP  Lab Results  Component Value Date   WBC 8.2 12/21/2013   NEUTROABS 5.4 12/21/2013   HGB 9.9* 12/21/2013   HCT 31.4* 12/21/2013   MCV 87.1 12/21/2013   PLT 329 12/21/2013      Chemistry      Component Value Date/Time   NA 139 12/21/2013 1037   K 4.2 12/21/2013 1037   CO2 22 12/21/2013 1037   BUN 10.2 12/21/2013 1037   CREATININE 0.9 12/21/2013 1037      Component Value Date/Time   CALCIUM 9.6 12/21/2013 1037   ALKPHOS 77 12/21/2013 1037   AST 16 12/21/2013 1037   ALT 26 12/21/2013 1037   BILITOT 0.21 12/21/2013 1037       No results found for: LABCA2  No components found for: LABCA125  No results for input(s): INR in the last 168 hours.  Urinalysis No results found for: COLORURINE  STUDIES: Most recent echocardiogram on 11/28/13 showed an ejection fraction of 65-70%.  ASSESSMENT: 43 y.o. BRCA negative [place] woman status post right breast upper outer quadrant biopsy 08/08/2013 for a triple positive invasive ductal carcinoma, grade 2, with an MIB-1 of 52%, and a suspicious lymph node biopsied at the same time pathologically benign.  (1) genetics  testing found no mutations in ATM, BARD1, BRCA1, BRCA2, BRIP1, CDH1, CHEK2, MRE11A, MUTYH, NBN, NF1, PALB2, PTEN, RAD50, RAD51C, RAD51D, and TP53  (2) neoadjuvant chemotherapy to consist of carboplatin, docetaxel, trastuzumab and pertuzumab for 6 cycles, starting 09/07/2013,  (a) pertuzumab discontinued after cycle 1 because of severe diarrhea  (b) carboplatin switched for cyclophosphamide because of hypersensitivity reaction on 11/20/13  (c) docetaxel removed from final cycle because of peripheral neuropathy symptoms.  (3) trastuzumab to  continue to complete one year (through August 2016); Echocardiogram on 08/25/13 demonstrated a LVEF of 60-65%.    (4) definitive surgery to follow chemotherapy  (5) adjuvant radiation to follow surgery  (6) anti-estrogens to follow radiation.  PLAN: Azarria is in good spirits today. The labs were reviewed in detail and were entirely stable. She has no new complaints to offer and is ready for surgery.   Maressa is scheduled for a lumpectomy on 1/14. Her next herceptin treatment in will be in early January 2016. Her next echocardiogram is due in February. She will have a follow up visit with Dr. Jana Hakim on 2/11. She understands and agrees with this plan. She knows the goal of treatment in her case is cure. She has been encouraged to call with any issues that might arise before her next visit here.   Marcelino Duster, NP 12/27/2013 11:28 AM

## 2013-12-27 NOTE — Telephone Encounter (Signed)
, °

## 2013-12-28 ENCOUNTER — Ambulatory Visit: Payer: 59 | Admitting: Nurse Practitioner

## 2013-12-28 ENCOUNTER — Other Ambulatory Visit: Payer: 59

## 2014-01-02 ENCOUNTER — Telehealth: Payer: Self-pay | Admitting: Nurse Practitioner

## 2014-01-02 NOTE — Telephone Encounter (Signed)
pt cld to r/s inf-adv I would have to trans to [facility] to adv of what date trmt can be r/s to-

## 2014-01-02 NOTE — Telephone Encounter (Signed)
per HF to chge pof to accomendate pt insurance-pt aware of chges. Sent MW emailt o move trmt to 1/8-will call pt after reply

## 2014-01-03 ENCOUNTER — Telehealth: Payer: Self-pay | Admitting: *Deleted

## 2014-01-03 ENCOUNTER — Telehealth: Payer: Self-pay | Admitting: Nurse Practitioner

## 2014-01-03 NOTE — Telephone Encounter (Signed)
Per staff message and POF I have scheduled appts. Advised scheduler of appts. JMW  

## 2014-01-03 NOTE — Telephone Encounter (Signed)
mailed updated copy of sch

## 2014-01-10 ENCOUNTER — Ambulatory Visit: Payer: 59 | Admitting: Nurse Practitioner

## 2014-01-10 ENCOUNTER — Other Ambulatory Visit: Payer: 59

## 2014-01-16 ENCOUNTER — Telehealth: Payer: Self-pay | Admitting: *Deleted

## 2014-01-16 ENCOUNTER — Other Ambulatory Visit: Payer: Self-pay | Admitting: Nurse Practitioner

## 2014-01-16 MED ORDER — CLINDAMYCIN HCL 300 MG PO CAPS: 300.0000 mg | ORAL_CAPSULE | Freq: Three times a day (TID) | ORAL | Status: DC

## 2014-01-16 NOTE — Telephone Encounter (Signed)
Pt concerned about pus under her finger nails. Prescription for clindamycin called into [facility] outpatient pharmacy.  Pt instructed to take 1 capsule 3x daily for 7 days.  If condition does not improve after 3 days pt needs to contact her primary care physician.  Pt verbalizes understanding.

## 2014-01-17 ENCOUNTER — Other Ambulatory Visit: Payer: 59

## 2014-01-17 ENCOUNTER — Ambulatory Visit: Payer: 59

## 2014-01-17 ENCOUNTER — Ambulatory Visit: Payer: 59 | Admitting: Nurse Practitioner

## 2014-01-19 ENCOUNTER — Other Ambulatory Visit: Payer: Self-pay | Admitting: Oncology

## 2014-01-19 ENCOUNTER — Telehealth: Payer: Self-pay | Admitting: Oncology

## 2014-01-19 ENCOUNTER — Encounter: Payer: Self-pay | Admitting: Nurse Practitioner

## 2014-01-19 ENCOUNTER — Other Ambulatory Visit (HOSPITAL_BASED_OUTPATIENT_CLINIC_OR_DEPARTMENT_OTHER): Payer: BLUE CROSS/BLUE SHIELD

## 2014-01-19 ENCOUNTER — Ambulatory Visit (HOSPITAL_BASED_OUTPATIENT_CLINIC_OR_DEPARTMENT_OTHER): Payer: BLUE CROSS/BLUE SHIELD

## 2014-01-19 ENCOUNTER — Ambulatory Visit (HOSPITAL_BASED_OUTPATIENT_CLINIC_OR_DEPARTMENT_OTHER): Payer: BLUE CROSS/BLUE SHIELD | Admitting: Nurse Practitioner

## 2014-01-19 VITALS — Ht 65.0 in | Wt 316.3 lb

## 2014-01-19 VITALS — BP 127/73 | HR 90 | Temp 97.5°F | Resp 18

## 2014-01-19 DIAGNOSIS — Z5112 Encounter for antineoplastic immunotherapy: Secondary | ICD-10-CM

## 2014-01-19 DIAGNOSIS — C50411 Malignant neoplasm of upper-outer quadrant of right female breast: Secondary | ICD-10-CM

## 2014-01-19 DIAGNOSIS — Z170 Estrogen receptor positive status [ER+]: Secondary | ICD-10-CM

## 2014-01-19 DIAGNOSIS — C50919 Malignant neoplasm of unspecified site of unspecified female breast: Secondary | ICD-10-CM

## 2014-01-19 DIAGNOSIS — IMO0002 Reserved for concepts with insufficient information to code with codable children: Secondary | ICD-10-CM

## 2014-01-19 LAB — CBC WITH DIFFERENTIAL/PLATELET
BASO%: 1.1 % (ref 0.0–2.0)
Basophils Absolute: 0.1 10*3/uL (ref 0.0–0.1)
EOS%: 1.3 % (ref 0.0–7.0)
Eosinophils Absolute: 0.1 10*3/uL (ref 0.0–0.5)
HEMATOCRIT: 34.7 % — AB (ref 34.8–46.6)
HEMOGLOBIN: 10.7 g/dL — AB (ref 11.6–15.9)
LYMPH%: 31.6 % (ref 14.0–49.7)
MCH: 27.6 pg (ref 25.1–34.0)
MCHC: 30.8 g/dL — AB (ref 31.5–36.0)
MCV: 89.4 fL (ref 79.5–101.0)
MONO#: 0.7 10*3/uL (ref 0.1–0.9)
MONO%: 9.6 % (ref 0.0–14.0)
NEUT#: 4.2 10*3/uL (ref 1.5–6.5)
NEUT%: 56.4 % (ref 38.4–76.8)
PLATELETS: 389 10*3/uL (ref 145–400)
RBC: 3.88 10*6/uL (ref 3.70–5.45)
RDW: 18 % — ABNORMAL HIGH (ref 11.2–14.5)
WBC: 7.5 10*3/uL (ref 3.9–10.3)
lymph#: 2.4 10*3/uL (ref 0.9–3.3)

## 2014-01-19 LAB — COMPREHENSIVE METABOLIC PANEL (CC13)
ALT: 16 U/L (ref 0–55)
AST: 17 U/L (ref 5–34)
Albumin: 3.4 g/dL — ABNORMAL LOW (ref 3.5–5.0)
Alkaline Phosphatase: 75 U/L (ref 40–150)
Anion Gap: 9 mEq/L (ref 3–11)
BILIRUBIN TOTAL: 0.27 mg/dL (ref 0.20–1.20)
BUN: 12.6 mg/dL (ref 7.0–26.0)
CO2: 24 mEq/L (ref 22–29)
CREATININE: 0.8 mg/dL (ref 0.6–1.1)
Calcium: 9.3 mg/dL (ref 8.4–10.4)
Chloride: 107 mEq/L (ref 98–109)
EGFR: >90 mL/min/{1.73_m2} (ref >90)
Glucose: 93 mg/dl (ref 70–140)
POTASSIUM: 4.3 meq/L (ref 3.5–5.1)
Sodium: 140 mEq/L (ref 136–145)
TOTAL PROTEIN: 6.8 g/dL (ref 6.4–8.3)

## 2014-01-19 MED ORDER — DIPHENHYDRAMINE HCL 25 MG PO CAPS
ORAL_CAPSULE | ORAL | Status: AC
  Filled 2014-01-19: qty 1

## 2014-01-19 MED ORDER — DIPHENHYDRAMINE HCL 25 MG PO CAPS
25.0000 mg | ORAL_CAPSULE | Freq: Once | ORAL | Status: AC
  Administered 2014-01-19: 25 mg via ORAL

## 2014-01-19 MED ORDER — SODIUM CHLORIDE 0.9 % IV SOLN
Freq: Once | INTRAVENOUS | Status: AC
  Administered 2014-01-19: 13:00:00 via INTRAVENOUS

## 2014-01-19 MED ORDER — HEPARIN SOD (PORK) LOCK FLUSH 100 UNIT/ML IV SOLN
500.0000 [IU] | Freq: Once | INTRAVENOUS | Status: AC | PRN
  Administered 2014-01-19: 500 [IU]
  Filled 2014-01-19: qty 5

## 2014-01-19 MED ORDER — HEPARIN SOD (PORK) LOCK FLUSH 100 UNIT/ML IV SOLN
500.0000 [IU] | Freq: Once | INTRAVENOUS | Status: AC
  Administered 2014-01-19: 500 [IU] via INTRAVENOUS
  Filled 2014-01-19: qty 5

## 2014-01-19 MED ORDER — SODIUM CHLORIDE 0.9 % IJ SOLN
10.0000 mL | INTRAMUSCULAR | Status: DC | PRN
  Administered 2014-01-19: 10 mL
  Filled 2014-01-19: qty 10

## 2014-01-19 MED ORDER — ACETAMINOPHEN 325 MG PO TABS
650.0000 mg | ORAL_TABLET | Freq: Once | ORAL | Status: AC
  Administered 2014-01-19: 650 mg via ORAL

## 2014-01-19 MED ORDER — SODIUM CHLORIDE 0.9 % IJ SOLN
10.0000 mL | INTRAMUSCULAR | Status: DC | PRN
  Administered 2014-01-19: 10 mL via INTRAVENOUS
  Filled 2014-01-19: qty 10

## 2014-01-19 MED ORDER — ACETAMINOPHEN 325 MG PO TABS
ORAL_TABLET | ORAL | Status: AC
Start: 2014-01-19 — End: 2014-01-19
  Filled 2014-01-19: qty 2

## 2014-01-19 MED ORDER — TRASTUZUMAB CHEMO INJECTION 440 MG
861.0000 mg | Freq: Once | INTRAVENOUS | Status: AC
  Administered 2014-01-19: 861 mg via INTRAVENOUS
  Filled 2014-01-19: qty 41

## 2014-01-19 NOTE — Progress Notes (Signed)
[facility]  Telephone:(336) [phone] Fax:(336) [phone]     ID: [name] DOB: March 30, 1970  MR#: [id number]  MWN#:[id number]  Patient Care Team: Horatio Pel, MD as PCP - General (Internal Medicine) Erroll Luna, MD as Consulting Physician (General Surgery) Chauncey Cruel, MD as Consulting Physician (Oncology) Allena Katz, MD as Consulting Physician (Obstetrics and Gynecology)  CHIEF COMPLAINT:  triple positive breast cancer  CURRENT TREATMENT: awaiting surgery, trastuzumab every 3 weeks  BREAST CANCER HISTORY: From the original intake note:  The patient herself palpated a mass in her right breast mid July. She brought it to Dr. Sherlynn Stalls attention and he set the patient up for bilateral diagnostic mammography and right breast ultrasonography of the breast Center 07/28/2013. Right mammogram showed an irregular mass in the upper outer quadrant measuring 1.4 cm. There was some associated pleomorphic microcalcifications. This was palpable, at the 10:30 position 11 cm from the nipple. By palpation it measured approximately 1.5 cm. Ultrasound confirmed an hypoechoic mass with irregular borders measuring 1.1 cm. The right axilla showed a few adjacent deep lymph nodes with mild cortical thickening.  On 08/08/2013 the patient underwent biopsy of the breast mass in question. The pathology (SAA [id number]) showed an invasive ductal carcinoma, grade 2, estrogen receptor 49% positive, progesterone receptor 47% positive, both with moderate staining intensity, with an MIB-1 of 52%, and HER-2 amplification with a signals ratio of 8.1. The right axillary lymph node biopsied at the same time was benign this was felt to be possibly concordant.  And 08/15/2013 the patient underwent bilateral breast MRI showing a 2 cm irregular enhancing mass in the upper outer quadrant of the right breast. There were no additional changes no abnormal appearing lymph nodes and no masses or  abnormalities in the left breast.  The patient's subsequent history is as detailed below  INTERVAL HISTORY: [name] returns today for follow up of her breast cancer. She is due for trastuzumab alone today. The interval history is remarkable for paronychia to several of her fingers bilaterally. Clindamycin TID was prescribed early this week, and her fingers are already improving. Her hair is already starting to grow back.  REVIEW OF SYSTEMS: [name] denies fevers, chills, nausea, or vomiting, or changes in bowel or bladder habits. She is eating and drinking well. She denies mouth sores, rashes, or peripheral neuropathy symptoms. She has no shortness of breath, chest pain, cough, palpitations, or fatigue. She has returned to work and is doing well, though sometimes she has trouble sleeping.  A detailed review of systems is otherwise negative.   PAST MEDICAL HISTORY: Past Medical History  Diagnosis Date  . Anxiety   . Wears contact lenses   . Snores   . Malignant neoplasm of breast (female), unspecified site     PAST SURGICAL HISTORY: Past Surgical History  Procedure Laterality Date  . Wisdom tooth extraction    . Portacath placement N/A 08/23/2013    Procedure: INSERTION PORT-A-CATH WITH ULTRA SOUND;  Surgeon: Joyice Faster. Cornett, MD;  Location: [facility];  Service: General;  Laterality: N/A;    FAMILY HISTORY Family History  Problem Relation Age of Onset  . Uterine cancer Mother 19    TAH/BSO  . Colon cancer Father 9    deceased 19  . Leukemia Maternal Grandmother     deceased 4  . Skin cancer Paternal Grandmother   . Prostate cancer Other    the patient's father died from colon cancer the age of 61. It  had been diagnosed 3 years prior. The patient's mother was diagnosed with cancer of the uterus at age 70. She is 45 years old as of August 2015. The patient had no brothers or sisters. There is a history of prostate cancer leukemia at in the family as well as  melanoma, but there is no history of breast or ovarian cancer in the family.  GYNECOLOGIC HISTORY:  No LMP recorded. Menarche age 68. The patient is GX P0. She is still having regular periods. 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 She lives alone, with no pets.    ADVANCED DIRECTIVES: Not in place. At her 08/16/2013 visit the patient was given the upper. Documents 2 complete and notarize so she may name her mother, [name] focal as her healthcare power of attorney, which is the patient's intention. Ms. focal can be reached at [phone]. She currently lives in [place] but is planning to move to [place]: History  Substance Use Topics  . Smoking status: Former Smoker    Quit date: 02/20/2013  . Smokeless tobacco: Not on file  . Alcohol Use: Yes     Comment: 1/week     Colonoscopy: Never  PAP: July 2015 Bone density: Never  Lipid panel:  Allergies  Allergen Reactions  . Amoxicillin Rash    Current Outpatient Prescriptions  Medication Sig Dispense Refill  . clindamycin (CLEOCIN) 300 MG capsule Take 1 capsule (300 mg total) by mouth 3 (three) times daily. 21 capsule 0  . UNABLE TO FIND Apply 1 each topically once. Med Name: Dispense per medical necessity cranial prothesis for this patient with alopecia secondary to chemotherapy due to breast cancer history. 1 each 0  . acetaminophen (TYLENOL) 160 MG/5ML liquid Take 20.3 mLs (650 mg total) by mouth once. (Patient not taking: Reported on 12/27/2013) 120 mL 0  . Calcium-Vitamin D-Vitamin K (CALCIUM SOFT CHEWS PO) Take by mouth daily.    . cholestyramine (QUESTRAN) 4 G packet Take 1 packet (4 g total) by mouth 3 (three) times daily with meals. (Patient not taking: Reported on 12/06/2013) 60 each 12  . hydrocortisone (ANUSOL-HC) 25 MG suppository Place 1 suppository (25 mg total)  rectally 2 (two) times daily. (Patient not taking: Reported on 12/27/2013) 12 suppository 1  . loratadine (CLARITIN) 10 MG tablet Take 10 mg by mouth daily as needed for allergies.    Marland Kitchen LORazepam (ATIVAN) 0.5 MG tablet TAKE 1 TABLET BY MOUTH AT BEDTIME AS NEEDED (NAUSEA OR VOMITING) (Patient not taking: Reported on 12/27/2013) 30 tablet 0  . Multiple Vitamins-Minerals (MULTI ADULT GUMMIES PO) Take 2 each by mouth daily.    Marland Kitchen oxyCODONE-acetaminophen (ROXICET) 5-325 MG per tablet Take 1 tablet by mouth every 4 (four) hours as needed. (Patient not taking: Reported on 12/21/2013) 30 tablet 0   No current facility-administered medications for this visit.   Facility-Administered Medications Ordered in Other Visits  Medication Dose Route Frequency Provider Last Rate Last Dose  . clindamycin (CLEOCIN) 600 mg in dextrose 5 % 50 mL IVPB  600 mg Intravenous Once Erroll Luna, MD      . heparin lock flush 100 unit/mL  500 Units Intracatheter Once PRN Chauncey Cruel, MD      . sodium chloride 0.9 % injection 10 mL  10 mL Intravenous PRN Chauncey Cruel, MD   10 mL at 11/30/13  1102  . sodium chloride 0.9 % injection 10 mL  10 mL Intracatheter PRN Chauncey Cruel, MD      . trastuzumab (HERCEPTIN) 861 mg in sodium chloride 0.9 % 250 mL chemo infusion  861 mg Intravenous Once Chauncey Cruel, MD 582 mL/hr at 01/19/14 1309 861 mg at 01/19/14 1309    OBJECTIVE: Middle-aged [place] American woman who appears stated age There were no vitals filed for this visit.   Body mass index is 52.64 kg/(m^2).     ECOG FS:1 - Symptomatic but completely ambulatory Skin: warm, dry, nail beds hyperpigmented and tender HEENT: sclerae anicteric, conjunctivae pink, oropharynx clear. No thrush or mucositis.  Lymph Nodes: No cervical or supraclavicular lymphadenopathy  Lungs: clear to auscultation bilaterally, no rales, wheezes, or rhonci  Heart: regular rate and rhythm  Abdomen: round, soft, non tender, positive bowel  sounds  Musculoskeletal: No focal spinal tenderness, no peripheral edema  Neuro: non focal, well oriented, positive affect  Breast: deferred  LAB RESULTS:  CMP     Component Value Date/Time   NA 140 01/19/2014 1154   K 4.3 01/19/2014 1154   CO2 24 01/19/2014 1154   GLUCOSE 93 01/19/2014 1154   BUN 12.6 01/19/2014 1154   CREATININE 0.8 01/19/2014 1154   CALCIUM 9.3 01/19/2014 1154   PROT 6.8 01/19/2014 1154   ALBUMIN 3.4* 01/19/2014 1154   AST 17 01/19/2014 1154   ALT 16 01/19/2014 1154   ALKPHOS 75 01/19/2014 1154   BILITOT 0.27 01/19/2014 1154    I No results found for: SPEP  Lab Results  Component Value Date   WBC 7.5 01/19/2014   NEUTROABS 4.2 01/19/2014   HGB 10.7* 01/19/2014   HCT 34.7* 01/19/2014   MCV 89.4 01/19/2014   PLT 389 01/19/2014      Chemistry      Component Value Date/Time   NA 140 01/19/2014 1154   K 4.3 01/19/2014 1154   CO2 24 01/19/2014 1154   BUN 12.6 01/19/2014 1154   CREATININE 0.8 01/19/2014 1154      Component Value Date/Time   CALCIUM 9.3 01/19/2014 1154   ALKPHOS 75 01/19/2014 1154   AST 17 01/19/2014 1154   ALT 16 01/19/2014 1154   BILITOT 0.27 01/19/2014 1154       No results found for: LABCA2  No components found for: LABCA125  No results for input(s): INR in the last 168 hours.  Urinalysis No results found for: COLORURINE  STUDIES: Most recent echocardiogram on 11/28/13 showed an ejection fraction of 65-70%.  ASSESSMENT: 44 y.o. BRCA negative [place] woman status post right breast upper outer quadrant biopsy 08/08/2013 for a triple positive invasive ductal carcinoma, grade 2, with an MIB-1 of 52%, and a suspicious lymph node biopsied at the same time pathologically benign.  (1) genetics testing found no mutations in ATM, BARD1, BRCA1, BRCA2, BRIP1, CDH1, CHEK2, MRE11A, MUTYH, NBN, NF1, PALB2, PTEN, RAD50, RAD51C, RAD51D, and TP53  (2) neoadjuvant chemotherapy to consist of carboplatin, docetaxel, trastuzumab  and pertuzumab for 6 cycles, starting 09/07/2013,  (a) pertuzumab discontinued after cycle 1 because of severe diarrhea  (b) carboplatin switched for cyclophosphamide because of hypersensitivity reaction on 11/20/13  (c) docetaxel removed from final cycle because of peripheral neuropathy symptoms.  (3) trastuzumab to continue to complete one year (through August 2016)  (4) definitive surgery to follow chemotherapy  (5) adjuvant radiation to follow surgery  (6) anti-estrogens to follow radiation.  PLAN: Elexa looks and feels well today. The labs  were reviewed in detail and were entirely stable. She will proceed with trastuzumab.   Her paronychia is improving and she will finish out her course of antibiotics.   Saje is scheduled for surgery next week. She will continue with trastuzumab every 3 weeks. Her next office visit her will be next month with Dr. Jana Hakim. She will proceed with radiation after surgery, then antiestrogen therapy. She understands and agrees with this plan. She knows the goal of treatment in her case is cure. She has been encouraged to call with any issues that might arise before her next visit here.  Marcelino Duster, NP 01/19/2014 1:23 PM

## 2014-01-19 NOTE — Patient Instructions (Signed)

## 2014-01-19 NOTE — Telephone Encounter (Signed)
per 1/8 pof added flush appts to lab appts and cxd 1/27 appt w/HF. lb/inf for 1/27 remain. lmonvm for pt re changes and confirmed next appts for 1/21 and 1/27. schedule mailed. pt also mychart active.

## 2014-01-22 ENCOUNTER — Encounter (HOSPITAL_BASED_OUTPATIENT_CLINIC_OR_DEPARTMENT_OTHER): Payer: Self-pay | Admitting: *Deleted

## 2014-01-22 NOTE — Progress Notes (Signed)
Labs done 01/19/14

## 2014-01-24 ENCOUNTER — Ambulatory Visit
Admission: RE | Admit: 2014-01-24 | Discharge: 2014-01-24 | Disposition: A | Discharge status: Home / Self Care | Payer: BLUE CROSS/BLUE SHIELD | Source: Ambulatory Visit | Attending: Surgery | Admitting: Surgery

## 2014-01-24 DIAGNOSIS — C50911 Malignant neoplasm of unspecified site of right female breast: Secondary | ICD-10-CM

## 2014-01-25 ENCOUNTER — Ambulatory Visit (HOSPITAL_BASED_OUTPATIENT_CLINIC_OR_DEPARTMENT_OTHER): Payer: BLUE CROSS/BLUE SHIELD | Admitting: Anesthesiology

## 2014-01-25 ENCOUNTER — Ambulatory Visit (HOSPITAL_COMMUNITY)
Admission: RE | Admit: 2014-01-25 | Discharge: 2014-01-25 | Disposition: A | Discharge status: Home / Self Care | Payer: BLUE CROSS/BLUE SHIELD | Source: Ambulatory Visit | Attending: Surgery | Admitting: Surgery

## 2014-01-25 ENCOUNTER — Ambulatory Visit
Admission: RE | Admit: 2014-01-25 | Discharge: 2014-01-25 | Disposition: A | Discharge status: Home / Self Care | Payer: BLUE CROSS/BLUE SHIELD | Source: Ambulatory Visit | Attending: Surgery | Admitting: Surgery

## 2014-01-25 ENCOUNTER — Encounter (HOSPITAL_BASED_OUTPATIENT_CLINIC_OR_DEPARTMENT_OTHER)
Admission: RE | Disposition: A | Discharge status: Home / Self Care | Payer: Self-pay | Source: Ambulatory Visit | Attending: Surgery

## 2014-01-25 ENCOUNTER — Encounter (HOSPITAL_BASED_OUTPATIENT_CLINIC_OR_DEPARTMENT_OTHER): Payer: Self-pay | Admitting: *Deleted

## 2014-01-25 ENCOUNTER — Ambulatory Visit (HOSPITAL_BASED_OUTPATIENT_CLINIC_OR_DEPARTMENT_OTHER)
Admission: RE | Admit: 2014-01-25 | Discharge: 2014-01-25 | Disposition: A | Discharge status: Home / Self Care | Payer: BLUE CROSS/BLUE SHIELD | Source: Ambulatory Visit | Attending: Surgery | Admitting: Surgery

## 2014-01-25 DIAGNOSIS — Z9221 Personal history of antineoplastic chemotherapy: Secondary | ICD-10-CM | POA: Diagnosis not present

## 2014-01-25 DIAGNOSIS — Z87891 Personal history of nicotine dependence: Secondary | ICD-10-CM | POA: Diagnosis not present

## 2014-01-25 DIAGNOSIS — C50911 Malignant neoplasm of unspecified site of right female breast: Secondary | ICD-10-CM

## 2014-01-25 DIAGNOSIS — Z79899 Other long term (current) drug therapy: Secondary | ICD-10-CM | POA: Diagnosis not present

## 2014-01-25 DIAGNOSIS — C50411 Malignant neoplasm of upper-outer quadrant of right female breast: Secondary | ICD-10-CM | POA: Insufficient documentation

## 2014-01-25 DIAGNOSIS — Z87442 Personal history of urinary calculi: Secondary | ICD-10-CM | POA: Insufficient documentation

## 2014-01-25 HISTORY — PX: AXILLARY SENTINEL NODE BIOPSY: SHX5738

## 2014-01-25 HISTORY — PX: BREAST LUMPECTOMY WITH RADIOACTIVE SEED LOCALIZATION: SHX6424

## 2014-01-25 SURGERY — BREAST LUMPECTOMY WITH RADIOACTIVE SEED LOCALIZATION
Anesthesia: General | Site: Breast | Laterality: Right

## 2014-01-25 MED ORDER — FENTANYL CITRATE 0.05 MG/ML IJ SOLN
INTRAMUSCULAR | Status: DC | PRN
  Administered 2014-01-25 (×2): 50 ug via INTRAVENOUS

## 2014-01-25 MED ORDER — LIDOCAINE HCL (CARDIAC) 20 MG/ML IV SOLN
INTRAVENOUS | Status: DC | PRN
  Administered 2014-01-25: 60 mg via INTRAVENOUS

## 2014-01-25 MED ORDER — SODIUM CHLORIDE 0.9 % IJ SOLN
INTRAMUSCULAR | Status: AC
  Filled 2014-01-25: qty 10

## 2014-01-25 MED ORDER — METHYLENE BLUE 1 % INJ SOLN
INTRAMUSCULAR | Status: AC
  Filled 2014-01-25: qty 10

## 2014-01-25 MED ORDER — MIDAZOLAM HCL 2 MG/2ML IJ SOLN
INTRAMUSCULAR | Status: AC
  Filled 2014-01-25: qty 2

## 2014-01-25 MED ORDER — LACTATED RINGERS IV SOLN
INTRAVENOUS | Status: DC
  Administered 2014-01-25 (×2): via INTRAVENOUS

## 2014-01-25 MED ORDER — FENTANYL CITRATE 0.05 MG/ML IJ SOLN
INTRAMUSCULAR | Status: AC
  Filled 2014-01-25: qty 4

## 2014-01-25 MED ORDER — ONDANSETRON HCL 4 MG/2ML IJ SOLN
INTRAMUSCULAR | Status: DC | PRN
Start: 2014-01-25 — End: 2014-01-25
  Administered 2014-01-25: 4 mg via INTRAVENOUS

## 2014-01-25 MED ORDER — PROPOFOL 10 MG/ML IV BOLUS
INTRAVENOUS | Status: AC
  Filled 2014-01-25: qty 20

## 2014-01-25 MED ORDER — HYDROMORPHONE HCL 1 MG/ML IJ SOLN
0.2500 mg | INTRAMUSCULAR | Status: DC | PRN
  Administered 2014-01-25: 0.5 mg via INTRAVENOUS

## 2014-01-25 MED ORDER — TECHNETIUM TC 99M SULFUR COLLOID FILTERED
1.0000 | Freq: Once | INTRAVENOUS | Status: AC | PRN
  Administered 2014-01-25: 1 via INTRADERMAL

## 2014-01-25 MED ORDER — FENTANYL CITRATE 0.05 MG/ML IJ SOLN
INTRAMUSCULAR | Status: AC
  Filled 2014-01-25: qty 2

## 2014-01-25 MED ORDER — OXYCODONE HCL 5 MG PO TABS
ORAL_TABLET | ORAL | Status: AC
Start: 2014-01-25 — End: 2014-01-25
  Filled 2014-01-25: qty 1

## 2014-01-25 MED ORDER — MIDAZOLAM HCL 2 MG/2ML IJ SOLN
1.0000 mg | INTRAMUSCULAR | Status: DC | PRN
Start: 2014-01-25 — End: 2014-01-25
  Administered 2014-01-25: 2 mg via INTRAVENOUS
  Administered 2014-01-25: 1 mg via INTRAVENOUS

## 2014-01-25 MED ORDER — CLINDAMYCIN PHOSPHATE 600 MG/50ML IV SOLN
INTRAVENOUS | Status: AC
  Filled 2014-01-25: qty 50

## 2014-01-25 MED ORDER — OXYCODONE HCL 5 MG PO TABS
5.0000 mg | ORAL_TABLET | Freq: Once | ORAL | Status: AC | PRN
  Administered 2014-01-25: 5 mg via ORAL

## 2014-01-25 MED ORDER — PROPOFOL 10 MG/ML IV BOLUS
INTRAVENOUS | Status: DC | PRN
  Administered 2014-01-25: 200 mg via INTRAVENOUS
  Administered 2014-01-25: 10 mg via INTRAVENOUS

## 2014-01-25 MED ORDER — FENTANYL CITRATE 0.05 MG/ML IJ SOLN
50.0000 ug | INTRAMUSCULAR | Status: DC | PRN
  Administered 2014-01-25: 50 ug via INTRAVENOUS
  Administered 2014-01-25: 100 ug via INTRAVENOUS

## 2014-01-25 MED ORDER — EPHEDRINE SULFATE 50 MG/ML IJ SOLN
INTRAMUSCULAR | Status: DC | PRN
  Administered 2014-01-25 (×2): 10 mg via INTRAVENOUS

## 2014-01-25 MED ORDER — BUPIVACAINE-EPINEPHRINE 0.25% -1:200000 IJ SOLN
INTRAMUSCULAR | Status: DC | PRN
  Administered 2014-01-25: 10 mL

## 2014-01-25 MED ORDER — DEXAMETHASONE SODIUM PHOSPHATE 4 MG/ML IJ SOLN
INTRAMUSCULAR | Status: DC | PRN
  Administered 2014-01-25: 10 mg via INTRAVENOUS

## 2014-01-25 MED ORDER — HYDROMORPHONE HCL 1 MG/ML IJ SOLN
INTRAMUSCULAR | Status: AC
  Filled 2014-01-25: qty 1

## 2014-01-25 MED ORDER — OXYCODONE-ACETAMINOPHEN 5-325 MG PO TABS: 1.0000 | ORAL_TABLET | ORAL | Status: DC | PRN

## 2014-01-25 MED ORDER — ONDANSETRON HCL 4 MG/2ML IJ SOLN: 4.0000 mg | Freq: Once | INTRAMUSCULAR | Status: DC | PRN

## 2014-01-25 MED ORDER — OXYCODONE HCL 5 MG/5ML PO SOLN: 5.0000 mg | Freq: Once | ORAL | Status: AC | PRN

## 2014-01-25 SURGICAL SUPPLY — 66 items
APPLIER CLIP 9.375 MED OPEN (MISCELLANEOUS) ×3
APR CLP MED 9.3 20 MLT OPN (MISCELLANEOUS) ×2
BINDER BREAST 3XL (BIND) ×1 IMPLANT
BINDER BREAST LRG (GAUZE/BANDAGES/DRESSINGS) IMPLANT
BINDER BREAST MEDIUM (GAUZE/BANDAGES/DRESSINGS) IMPLANT
BINDER BREAST XLRG (GAUZE/BANDAGES/DRESSINGS) IMPLANT
BINDER BREAST XXLRG (GAUZE/BANDAGES/DRESSINGS) IMPLANT
BLADE CLIPPER SURG (BLADE) IMPLANT
BLADE SURG 10 STRL SS (BLADE) ×3 IMPLANT
BLADE SURG 15 STRL LF DISP TIS (BLADE) ×2 IMPLANT
BLADE SURG 15 STRL SS (BLADE) ×3
CANISTER SUC SOCK COL 7IN (MISCELLANEOUS) ×3 IMPLANT
CANISTER SUCT 1200ML W/VALVE (MISCELLANEOUS) ×3 IMPLANT
CHLORAPREP W/TINT 26ML (MISCELLANEOUS) ×3 IMPLANT
CLIP APPLIE 9.375 MED OPEN (MISCELLANEOUS) IMPLANT
CLIP TI WIDE RED SMALL 6 (CLIP) ×3 IMPLANT
COVER BACK TABLE 60X90IN (DRAPES) ×3 IMPLANT
COVER MAYO STAND STRL (DRAPES) ×3 IMPLANT
COVER PROBE W GEL 5X96 (DRAPES) ×3 IMPLANT
DECANTER SPIKE VIAL GLASS SM (MISCELLANEOUS) IMPLANT
DEVICE DUBIN W/COMP PLATE 8390 (MISCELLANEOUS) ×3 IMPLANT
DRAIN CHANNEL 19F RND (DRAIN) IMPLANT
DRAIN HEMOVAC 1/8 X 5 (WOUND CARE) IMPLANT
DRAPE LAPAROSCOPIC ABDOMINAL (DRAPES) ×3 IMPLANT
DRAPE PED LAPAROTOMY (DRAPES) ×2 IMPLANT
DRAPE UTILITY XL STRL (DRAPES) ×3 IMPLANT
ELECT COATED BLADE 2.86 ST (ELECTRODE) ×3 IMPLANT
ELECT REM PT RETURN 9FT ADLT (ELECTROSURGICAL) ×3
ELECTRODE REM PT RTRN 9FT ADLT (ELECTROSURGICAL) ×2 IMPLANT
EVACUATOR SILICONE 100CC (DRAIN) IMPLANT
GLOVE BIOGEL PI IND STRL 7.0 (GLOVE) IMPLANT
GLOVE BIOGEL PI IND STRL 8 (GLOVE) ×2 IMPLANT
GLOVE BIOGEL PI INDICATOR 7.0 (GLOVE) ×2
GLOVE BIOGEL PI INDICATOR 8 (GLOVE) ×1
GLOVE ECLIPSE 6.5 STRL STRAW (GLOVE) ×2 IMPLANT
GLOVE ECLIPSE 8.0 STRL XLNG CF (GLOVE) ×3 IMPLANT
GOWN STRL REUS W/ TWL LRG LVL3 (GOWN DISPOSABLE) ×4 IMPLANT
GOWN STRL REUS W/TWL LRG LVL3 (GOWN DISPOSABLE) ×6
HEMOSTAT SNOW SURGICEL 2X4 (HEMOSTASIS) ×1 IMPLANT
HEMOSTAT SURGICEL 2X14 (HEMOSTASIS) ×4 IMPLANT
KIT MARKER MARGIN INK (KITS) ×3 IMPLANT
LIQUID BAND (GAUZE/BANDAGES/DRESSINGS) ×5 IMPLANT
NDL HYPO 25X1 1.5 SAFETY (NEEDLE) ×2 IMPLANT
NEEDLE HYPO 25X1 1.5 SAFETY (NEEDLE) ×3 IMPLANT
NS IRRIG 1000ML POUR BTL (IV SOLUTION) ×3 IMPLANT
PACK BASIN DAY SURGERY FS (CUSTOM PROCEDURE TRAY) ×3 IMPLANT
PENCIL BUTTON HOLSTER BLD 10FT (ELECTRODE) ×3 IMPLANT
PIN SAFETY STERILE (MISCELLANEOUS) IMPLANT
SLEEVE SCD COMPRESS KNEE MED (MISCELLANEOUS) ×3 IMPLANT
SPONGE GAUZE 4X4 12PLY STER LF (GAUZE/BANDAGES/DRESSINGS) IMPLANT
SPONGE LAP 18X18 X RAY DECT (DISPOSABLE) IMPLANT
SPONGE LAP 4X18 X RAY DECT (DISPOSABLE) ×3 IMPLANT
STAPLER VISISTAT 35W (STAPLE) IMPLANT
SUT ETHILON 3 0 PS 1 (SUTURE) IMPLANT
SUT MNCRL AB 3-0 PS2 18 (SUTURE) ×5 IMPLANT
SUT MNCRL AB 4-0 PS2 18 (SUTURE) ×3 IMPLANT
SUT SILK 2 0 SH (SUTURE) IMPLANT
SUT VIC AB 3-0 SH 27 (SUTURE) ×3
SUT VIC AB 3-0 SH 27X BRD (SUTURE) ×2 IMPLANT
SUT VICRYL 3-0 CR8 SH (SUTURE) ×3 IMPLANT
SYR BULB 3OZ (MISCELLANEOUS) ×3 IMPLANT
SYR CONTROL 10ML LL (SYRINGE) ×6 IMPLANT
TOWEL OR 17X24 6PK STRL BLUE (TOWEL DISPOSABLE) ×3 IMPLANT
TOWEL OR NON WOVEN STRL DISP B (DISPOSABLE) ×3 IMPLANT
TUBE CONNECTING 20X1/4 (TUBING) ×3 IMPLANT
YANKAUER SUCT BULB TIP NO VENT (SUCTIONS) ×4 IMPLANT

## 2014-01-25 NOTE — Anesthesia Postprocedure Evaluation (Signed)
  Anesthesia Post-op Note  Patient: [name]  Procedure(s) Performed: Procedure(s): RIGHT BREAST LUMPECTOMY WITH RADIOACTIVE SEED LOCALIZATION  (Right) RIGHT SENTINEL LYMPH NODE MAPPING (Right)  Patient Location: PACU  Anesthesia Type:General and GA combined with regional for post-op pain  Level of Consciousness: awake, alert  and oriented  Airway and Oxygen Therapy: Patient Spontanous Breathing and Patient connected to nasal cannula oxygen  Post-op Pain: mild  Post-op Assessment: Post-op Vital signs reviewed, Patient's Cardiovascular Status Stable, Respiratory Function Stable, Patent Airway and Pain level controlled  Post-op Vital Signs: stable  Last Vitals:  Filed Vitals:   01/25/14 1204  BP: 136/86  Pulse: 82  Temp: 36.6 C  Resp: 18    Complications: No apparent anesthesia complications

## 2014-01-25 NOTE — Anesthesia Procedure Notes (Addendum)
Anesthesia Regional Block:  Pectoralis block  Pre-Anesthetic Checklist: ,, timeout performed, Correct Patient, Correct Site, Correct Laterality, Correct Procedure, Correct Position, site marked, Risks and benefits discussed,  Surgical consent,  Pre-op evaluation,  At surgeon's request and post-op pain management  Laterality: Right  Prep: chloraprep       Needles:   Needle Type: Echogenic Stimulator Needle     Needle Length: 9cm 9 cm Needle Gauge: 22 and 22 G    Additional Needles:  Procedures: ultrasound guided (picture in chart) Pectoralis block Narrative:  Start time: 01/25/2014 8:40 AM End time: 01/25/2014 8:45 AM Injection made incrementally with aspirations every 5 mL.  Performed by: Personally   Additional Notes: 30 cc 0.5% Marcaine with 1:200 Epi   Procedure Name: LMA Insertion Date/Time: 01/25/2014 8:51 AM Performed by: Maryella Shivers Pre-anesthesia Checklist: Patient identified, Emergency Drugs available, Suction available and Patient being monitored Patient Re-evaluated:Patient Re-evaluated prior to inductionOxygen Delivery Method: Circle System Utilized Preoxygenation: Pre-oxygenation with 100% oxygen Intubation Type: IV induction Ventilation: Mask ventilation without difficulty LMA: LMA inserted LMA Size: 4.0 Number of attempts: 1 Airway Equipment and Method: Bite block Placement Confirmation: positive ETCO2 Tube secured with: Tape Dental Injury: Teeth and Oropharynx as per pre-operative assessment

## 2014-01-25 NOTE — Op Note (Signed)
[name], [name]                ACCOUNT NO.:  [id number]  MEDICAL RECORD NO.:  [id number]  LOCATION:                               FACILITY:  [facility]  PHYSICIAN:  Marcello Moores A. Christean Silvestri, M.D.DATE OF BIRTH:  04/01/1970  DATE OF PROCEDURE:  01/25/2014 DATE OF DISCHARGE:  01/25/2014                              OPERATIVE REPORT   PREOPERATIVE DIAGNOSIS:  Stage 2 right breast cancer, upper outer quadrant.  POSTOPERATIVE DIAGNOSIS:  Stage 2 right breast cancer, upper outer quadrant.  PROCEDURES: 1. Right breast seed localized lumpectomy. 2. Right axillary sentinel lymph node mapping with technetium sulfur     colloid.  SURGEON:  Marcello Moores A. Dalayna Lauter, MD  ANESTHESIA:  LMA with pectoral block per anesthesia and 10 mL of 0.5% Sensorcaine local with epinephrine.  ESTIMATED BLOOD LOSS:  Minimal.  SPECIMEN: 1. Right breast mass with both clip and radioactive seed in specimen. 2. The medial/inferior margin. 3. Axillary sentinel nodes, all to pathology.  DRAINS:  None.  IV FLUIDS:  500 mL crystalloid.  INDICATIONS FOR PROCEDURE:  The patient is a pleasant 44 year old female, with stage 2 right breast cancer.  She received neoadjuvant chemotherapy and has completed that and wished to pursue breast conservation.  Risk of bleeding, infection, reexcision, seroma, wound complications, cosmetic deformity, need of further therapies, and possible mastectomy down the road.  If this fails, it has been discussed with the patient as well as reconstruction options if that happens.  She wishes to proceed.  DESCRIPTION OF PROCEDURE:  The patient met in the holding area and questions were answered.  She underwent her injection of technetium sulfur colloid in the holding area to the right breast.  Neoprobe was used to verify seed location and I also checked her injection site, and she had good activity in the right axilla.  This was marked.  We discussed the surgery and other potential issues,  including complications, as outlined above.  She wished to proceed.  The patient was taken back to the operating room and placed supine. After induction of general anesthesia, the right breast was prepped and draped in a sterile fashion.  Time-out was done to verify the correct patient and side.  Neoprobe used, and the seed was localized.  This was in the right upper outer quadrant toward the axilla.  Curvilinear incision was made in the upper outer quadrant right breast, and a Neoprobe was used to aid in dissecting around the area.  We took this out and took radiograph and the margins were grossly clear.  She did have what appeared to be somewhat of a close margin, which was medial and inferior and I re-excised this margin widely.  This was sent as a second specimen.  The seeding clips were in the specimen.  Cavity was found to be hemostatic.  Through the same incision, I could reach the axilla, therefore, proceeded with the mapping part of the procedure. Neoprobe was used, and we were able to enter the right axilla.  There were 3 hot sentinel nodes identified, 2 are level 1 and 1 was a level 2 node.  These were removed, and background counts approached 0. Hemostasis achieved, and a small  piece of Surgicel Snow placed into the wound with good hemostasis.  I clipped the lumpectomy cavity for future radiation therapy.  We then closed this in layers with a 3-0 Vicryl and 3-0 Monocryl subcuticular stitch.  Dermabond applied as dressing.  She was placed in a binder.  All final counts of sponge, needle, and instruments found to be correct at this portion of the case.  The patient was awoke, extubated, and taken to recovery in satisfactory condition.     [name], M.D.     TAC/MEDQ  D:  01/25/2014  T:  01/25/2014  Job:  [id number]

## 2014-01-25 NOTE — Progress Notes (Signed)
Assisted Dr. Joslin with right, ultrasound guided, pectoralis block. Side rails up, monitors on throughout procedure. See vital signs in flow sheet. Tolerated Procedure well. 

## 2014-01-25 NOTE — Anesthesia Preprocedure Evaluation (Signed)
Anesthesia Evaluation  Patient identified by MRN, date of birth, ID band Patient awake    Reviewed: Allergy & Precautions, NPO status , Patient's Chart, lab work & pertinent test results  Airway Mallampati: II  TM Distance: >3 FB Neck ROM: Full    Dental  (+) Teeth Intact, Dental Advisory Given   Pulmonary former smoker,  breath sounds clear to auscultation        Cardiovascular Rhythm:Regular Rate:Normal     Neuro/Psych    GI/Hepatic   Endo/Other    Renal/GU      Musculoskeletal   Abdominal (+) + obese,   Peds  Hematology   Anesthesia Other Findings   Reproductive/Obstetrics                             Anesthesia Physical Anesthesia Plan  ASA: III  Anesthesia Plan: General   Post-op Pain Management:    Induction:   Airway Management Planned: LMA  Additional Equipment:   Intra-op Plan:   Post-operative Plan: Extubation in OR  Informed Consent: I have reviewed the patients History and Physical, chart, labs and discussed the procedure including the risks, benefits and alternatives for the proposed anesthesia with the patient or authorized representative who has indicated his/her understanding and acceptance.   Dental advisory given  Plan Discussed with: CRNA and Anesthesiologist  Anesthesia Plan Comments:         Anesthesia Quick Evaluation

## 2014-01-25 NOTE — Transfer of Care (Signed)
Immediate Anesthesia Transfer of Care Note  Patient: [name]  Procedure(s) Performed: Procedure(s): RIGHT BREAST LUMPECTOMY WITH RADIOACTIVE SEED LOCALIZATION  (Right) RIGHT SENTINEL LYMPH NODE MAPPING (Right)  Patient Location: PACU  Anesthesia Type:GA combined with regional for post-op pain  Level of Consciousness: sedated  Airway & Oxygen Therapy: Patient Spontanous Breathing and Patient connected to face mask oxygen  Post-op Assessment: Report given to PACU RN and Post -op Vital signs reviewed and stable  Post vital signs: Reviewed and stable  Complications: No apparent anesthesia complications

## 2014-01-25 NOTE — Op Note (Deleted)
[name], [name]                ACCOUNT NO.:  [id number]  MEDICAL RECORD NO.:  [id number]  LOCATION:                               FACILITY:  [facility]  PHYSICIAN:  Marcello Moores A. Elfida Shimada, M.D.DATE OF BIRTH:  04/01/1970  DATE OF PROCEDURE:  01/25/2014 DATE OF DISCHARGE:  01/25/2014                              OPERATIVE REPORT   PREOPERATIVE DIAGNOSIS:  Stage 2 right breast cancer, upper outer quadrant.  POSTOPERATIVE DIAGNOSIS:  Stage 2 right breast cancer, upper outer quadrant.  PROCEDURES: 1. Right breast seed localized lumpectomy. 2. Right axillary sentinel lymph node mapping with technetium sulfur     colloid.  SURGEON:  Marcello Moores A. Tyrese Ficek, MD  ANESTHESIA:  LMA with pectoral block per anesthesia and 10 mL of 0.5% Sensorcaine local with epinephrine.  ESTIMATED BLOOD LOSS:  Minimal.  SPECIMEN: 1. Right breast mass with both clip and radioactive seed in specimen. 2. The medial/inferior margin. 3. Axillary sentinel nodes, all to pathology.  DRAINS:  None.  IV FLUIDS:  500 mL crystalloid.  INDICATIONS FOR PROCEDURE:  The patient is a pleasant 44 year old female, with stage 2 right breast cancer.  She received neoadjuvant chemotherapy and has completed that and wished to pursue breast conservation.  Risk of bleeding, infection, reexcision, seroma, wound complications, cosmetic deformity, need of further therapies, and possible mastectomy down the road.  If this fails, it has been discussed with the patient as well as reconstruction options if that happens.  She wishes to proceed.  DESCRIPTION OF PROCEDURE:  The patient met in the holding area and questions were answered.  She underwent her injection of technetium sulfur colloid in the holding area to the right breast.  Neoprobe was used to verify seed location and I also checked her injection site, and she had good activity in the right axilla.  This was marked.  We discussed the surgery and other potential issues,  including complications, as outlined above.  She wished to proceed.  The patient was taken back to the operating room and placed supine. After induction of general anesthesia, the right breast was prepped and draped in a sterile fashion.  Time-out was done to verify the correct patient and side.  Neoprobe used, and the seed was localized.  This was in the right upper outer quadrant toward the axilla.  Curvilinear incision was made in the upper outer quadrant right breast, and a Neoprobe was used to aid in dissecting around the area.  We took this out and took radiograph and the margins were grossly clear.  She did have what appeared to be somewhat of a close margin, which was medial and inferior and I re-excised this margin widely.  This was sent as a second specimen.  The seeding clips were in the specimen.  Cavity was found to be hemostatic.  Through the same incision, I could reach the axilla, therefore, proceeded with the mapping part of the procedure. Neoprobe was used, and we were able to enter the right axilla.  There were 3 hot sentinel nodes identified, 2 are level 1 and 1 was a level 2 node.  These were removed, and background counts approached 0. Hemostasis achieved, and a small  piece of Surgicel Snow placed into the wound with good hemostasis.  I clipped the lumpectomy cavity for future radiation therapy.  We then closed this in layers with a 3-0 Vicryl and 3-0 Monocryl subcuticular stitch.  Dermabond applied as dressing.  She was placed in a binder.  All final counts of sponge, needle, and instruments found to be correct at this portion of the case.  The patient was awoke, extubated, and taken to recovery in satisfactory condition.     Gittel Mccamish A. Allee Busk, M.D.     TAC/MEDQ  D:  01/25/2014  T:  01/25/2014  Job:  [id number]

## 2014-01-25 NOTE — H&P (Signed)
H&P   Linda Linda (MR# [id number])      H&P Info    Author Note Status Last Update User Last Update Date/Time   Erroll Luna, MD Signed Erroll Luna, MD 12/11/2013 12:34 PM    H&P    Expand All Collapse All   Linda Linda 12/11/2013 11:53 AM Location: [facility] Patient #: [id number] DOB: 28-May-1970 Single / Language: Linda Linda / Race: Black or African American Female History of Present Illness Linda Moores A. Vasilia Dise MD; 12/11/2013 12:29 PM) Patient words: breast f/u  The patient herself palpated a mass in her right breast mid July. She brought it to Dr. Sherlynn Stalls attention and he set the patient up for bilateral diagnostic mammography and right breast ultrasonography of the breast Center 07/28/2013. Right mammogram showed an irregular mass in the upper outer quadrant measuring 1.4 cm. There was some associated pleomorphic microcalcifications. This was palpable, at the 10:30 position 11 cm from the nipple. By palpation it measured approximately 1.5 cm. Ultrasound confirmed an hypoechoic mass with irregular borders measuring 1.1 cm. The right axilla showed a few adjacent deep lymph nodes with mild cortical thickening.  On 08/08/2013 the patient underwent biopsy of the breast mass in question. The pathology ([id number] [phone]) showed an invasive ductal carcinoma, Linda Linda, estrogen receptor 49% positive, progesterone receptor 47% positive, both with moderate staining intensity, with an MIB-1 of 52%, and HER-[name] amplification with a signals ratio of 8.1. The right axillary lymph node biopsied at the same time was benign this was felt to be possibly concordant.  And 08/15/2013 the patient underwent bilateral breast MRI showing a [name] cm irregular enhancing mass in the upper outer quadrant of the right breast. There were no additional changes no abnormal appearing lymph nodes and no masses or abnormalities in the left breast.  The patient's subsequent history is as detailed below  INTERVAL  HISTORY: Linda Linda returns today for follow up of her breast cancer. She is doing ok. Ready for surgery. cannot feel mass on the right    CLINICAL DATA: 44 year old female with biopsy proven invasive mammary carcinoma in the upper-outer quadrant of the right breast. The patient had a right axillary lymph node biopsied which showed benign lymphoid tissue with no evidence of malignancy. Correlation with sentinel lymph node surgical biopsy was recommended. Assess response to neoadjuvant chemotherapy.  LABS: None obtained at the time of imaging.  EXAM: BILATERAL BREAST MRI WITH AND WITHOUT CONTRAST  TECHNIQUE: Multiplanar, multisequence MR images of both breasts were obtained prior to and following the intravenous administration of 12m of MultiHance.  THREE-DIMENSIONAL MR IMAGE RENDERING ON INDEPENDENT WORKSTATION:  Three-dimensional MR images were rendered by post-processing of the original MR data on an independent workstation. The three-dimensional MR images were interpreted, and findings are reported in the following complete MRI report for this study. Three dimensional images were evaluated at the independent DynaCad workstation  COMPARISON: Mammograms dated 08/08/2013 and 07/28/2013. Prior MRI dated 08/15/2013.  FINDINGS: Breast composition: b. Scattered fibroglandular tissue.  Background parenchymal enhancement: Mild  Right breast: The irregular enhancing mass in the middle third of the upper outer quadrant of the right breast has decreased in the intensity of enhancement in size. The mass currently measures [name].0 x 1.0 x 0.8 cm. It is associated with a signal void artifact corresponding with the biopsy clip. On the prior MRI dated 08/15/2013 it measured [name].0 x 1.4 x 1.4 cm.  Left breast: No mass or abnormal enhancement.  Lymph nodes: No abnormal appearing lymph nodes.  Ancillary  findings: None.  IMPRESSION: Interval reduction in the size of the irregular  enhancing mass in the upper-outer quadrant of the right breast corresponding to response to neoadjuvant chemotherapy.  RECOMMENDATION: Treatment plan.  BI-RADS CATEGORY 6: Known biopsy-proven malignancy.   Electronically Signed By: Lillia Mountain M.D. On: 10/30/2013 09:51.  The patient is a 44 year old female   Other Problems [name], [place]; 12/11/2013 11:53 AM) Breast Cancer Hemorrhoids Kidney Stone Lump In Breast  Past Surgical History [name], [place]; 12/11/2013 11:53 AM) Breast Biopsy Right. Oral Surgery Sentinel Lymph Node Biopsy  Diagnostic Studies History [name], [place]; 12/11/2013 11:53 AM) Colonoscopy never Mammogram within last year Pap Smear 1-5 years ago  Allergies [name], CMA; 12/11/2013 11:55 AM) Amoxicillin *PENICILLINS*  Medication History ([name], CMA; 12/11/2013 12:01 PM) LORazepam (0.5MG Tablet, Oral) Active. Anucort-HC (25MG Suppository, Rectal) Active. Prochlorperazine Maleate (10MG Tablet, Oral) Active. Dexamethasone (4MG Tablet, Oral) Active. Ondansetron HCl (8MG Tablet, Oral) Active. Lidocaine-Prilocaine ([name].5-[name].5% Cream, External as needed) Active. Claritin (5MG Tablet Chewable, Oral) Active.  Social History [name], [place]; 12/11/2013 11:53 AM) Alcohol use Occasional alcohol use. No drug use Tobacco use Former smoker.  Family History [name], [place]; 12/11/2013 11:53 AM) Arthritis Father. Colon Cancer Father. Depression Mother. Hypertension Mother. Ovarian Cancer Mother.  Pregnancy / Birth History [name], [place]; 12/11/2013 11:53 AM) Age at menarche 10 years. Contraceptive History Oral contraceptives. Gravida 0 Irregular periods Para 0     Review of Systems ([place]; 12/11/2013 11:53 AM) General Present- Appetite Loss and Fatigue. Not Present- Chills, Fever, Night Sweats, Weight Gain and Weight Loss. Skin Present- Dryness. Not Present- Change in Wart/Mole,  Hives, Jaundice, New Lesions, Non-Healing Wounds, Rash and Ulcer. HEENT Present- Nose Bleed and Wears glasses/contact lenses. Not Present- Earache, Hearing Loss, Hoarseness, Oral Ulcers, Ringing in the Ears, Seasonal Allergies, Sinus Pain, Sore Throat, Visual Disturbances and Yellow Eyes. Respiratory Present- Snoring. Not Present- Bloody sputum, Chronic Cough, Difficulty Breathing and Wheezing. Breast Present- Breast Mass. Not Present- Breast Pain, Nipple Discharge and Skin Changes. Cardiovascular Present- Rapid Heart Rate and Swelling of Extremities. Not Present- Chest Pain, Difficulty Breathing Lying Down, Leg Cramps, Palpitations and Shortness of Breath. Gastrointestinal Present- Bloody Stool, Change in Bowel Habits, Hemorrhoids, Nausea and Rectal Pain. Not Present- Abdominal Pain, Bloating, Chronic diarrhea, Constipation, Difficulty Swallowing, Excessive gas, Gets full quickly at meals, Indigestion and Vomiting. Female Genitourinary Present- Urgency. Not Present- Frequency, Nocturia, Painful Urination and Pelvic Pain. Neurological Present- Numbness and Tingling. Not Present- Decreased Memory, Fainting, Headaches, Seizures, Tremor, Trouble walking and Weakness. Psychiatric Not Present- Anxiety, Bipolar, Change in Sleep Pattern, Depression, Fearful and Frequent crying. Endocrine Present- Hair Changes and Hot flashes. Not Present- Cold Intolerance, Excessive Hunger, Heat Intolerance and New Diabetes. Hematology Not Present- Easy Bruising, Excessive bleeding, Gland problems, HIV and Persistent Infections.  Vitals ([name] CMA; 12/11/2013 11:59 AM) 12/11/2013 11:55 AM Weight: 309 lb Height: 65in Body Surface Area: [name].54 m Body Mass Index: 51.42 kg/m Temp.: 28F(Temporal)  Pulse: 81 (Regular)  BP: 132/80 (Sitting, Left Arm, Standard)     Physical Exam (Antonya Leeder A. Eythan Jayne MD; 12/11/2013 12:32 PM)  General Mental Status-Alert. General Appearance-Consistent with stated  age. Hydration-Well hydrated. Voice-Normal.  Head and Neck Head-normocephalic, atraumatic with no lesions or palpable masses. Trachea-midline. Thyroid Gland Characteristics - normal size and consistency.  Eye Eyeball - Bilateral-Extraocular movements intact. Sclera/Conjunctiva - Bilateral-No scleral icterus.  Chest and Lung Exam Chest and lung exam reveals -quiet, even and easy respiratory effort with no use of  accessory muscles and on auscultation, normal breath sounds, no adventitious sounds and normal vocal resonance. Inspection Chest Wall - Normal. Back - normal.  Breast Note: right breast mass not palpable. left breast normal   Musculoskeletal Normal Exam - Left-Upper Extremity Strength Normal and Lower Extremity Strength Normal. Normal Exam - Right-Upper Extremity Strength Normal and Lower Extremity Strength Normal.  Lymphatic Axillary  General Axillary Region: Bilateral - Description - Normal. Tenderness - Non Tender.    Assessment & Plan (Demetre Monaco A. Jesseka Drinkard MD; 12/11/2013 12:30 PM)  BREAST CANCER, RIGHT (174.9  C50.911) Impression: pt has completed chemotherapy and is ready for surgery. she is a good candidate for right breast lumpectomy and SLN mapping. Risk of lumpectomy include bleeding, infection, seroma, more surgery, use of seed/wire, wound care, cosmetic deformity and the need for other treatments, death , blood clots, death. Pt agrees to proceed. Risk of sentinel lymph node mapping include bleeding, infection, lymphedema, shoulder pain. stiffness, dye allergy. cosmetic deformity , blood clots, death, need for more surgery. Pt agres to proceed.  Current Plans Pt Education - [facility] Breast Biopsy Instructions (FLB): discussed with patient and provided information. Pt Education - [facility] Breast Biopsy HCI Schedule for Surgery [facility] Consent - BASIC ([place]): discussed with patient and provided information.

## 2014-01-25 NOTE — Brief Op Note (Signed)
01/25/2014  10:00 AM  PATIENT:  [name]  44 y.o. female  PRE-OPERATIVE DIAGNOSIS:  Right Breast Cancer  POST-OPERATIVE DIAGNOSIS:  Right Breast Cancer  PROCEDURE:  Procedure(s): RIGHT BREAST LUMPECTOMY WITH RADIOACTIVE SEED LOCALIZATION  (Right) RIGHT SENTINEL LYMPH NODE MAPPING (Right)  SURGEON:  Surgeon(s) and Role:    * Erroll Luna, MD - Primary     ANESTHESIA:   local, regional and general  EBL:  Total I/O In: 1400 [I.V.:1400] Out: -   BLOOD ADMINISTERED:none  DRAINS: none   LOCAL MEDICATIONS USED:  BUPIVICAINE   SPECIMEN:  Source of Specimen:  right breast  margin  and 3 SLN   DISPOSITION OF SPECIMEN:  PATHOLOGY  COUNTS:  YES  TOURNIQUET:  * No tourniquets in log *  DICTATION: .Other Dictation: Dictation Number [id number]  PLAN OF CARE: Discharge to home after PACU  PATIENT DISPOSITION:  PACU - hemodynamically stable.   Delay start of Pharmacological VTE agent (>24hrs) due to surgical blood loss or risk of bleeding: no

## 2014-01-25 NOTE — Progress Notes (Signed)
Radiology staff performed nuc med inj. Pt tol well after versed and fentanyl. VSS

## 2014-01-25 NOTE — Discharge Instructions (Signed)
[facility] °Office Phone Number [phone] ° °BREAST BIOPSY/ PARTIAL MASTECTOMY: POST OP INSTRUCTIONS ° °Always review your discharge instruction sheet given to you by the facility where your surgery was performed. ° °IF YOU HAVE DISABILITY OR FAMILY LEAVE FORMS, YOU MUST BRING THEM TO THE OFFICE FOR PROCESSING.  DO NOT GIVE THEM TO YOUR DOCTOR. ° °1. A prescription for pain medication may be given to you upon discharge.  Take your pain medication as prescribed, if needed.  If narcotic pain medicine is not needed, then you may take acetaminophen (Tylenol) or ibuprofen (Advil) as needed. °2. Take your usually prescribed medications unless otherwise directed °3. If you need a refill on your pain medication, please contact your pharmacy.  They will contact our office to request authorization.  Prescriptions will not be filled after 5pm or on week-ends. °4. You should eat very light the first 24 hours after surgery, such as soup, crackers, pudding, etc.  Resume your normal diet the day after surgery. °5. Most patients will experience some swelling and bruising in the breast.  Ice packs and a good support bra will help.  Swelling and bruising can take several days to resolve.  °6. It is common to experience some constipation if taking pain medication after surgery.  Increasing fluid intake and taking a stool softener will usually help or prevent this problem from occurring.  A mild laxative (Milk of Magnesia or Miralax) should be taken according to package directions if there are no bowel movements after 48 hours. °7. Unless discharge instructions indicate otherwise, you may remove your bandages 24-48 hours after surgery, and you may shower at that time.  You may have steri-strips (small skin tapes) in place directly over the incision.  These strips should be left on the skin for 7-10 days.  If your surgeon used skin glue on the incision, you may shower in 24 hours.  The glue will flake off over the  next 2-3 weeks.  Any sutures or staples will be removed at the office during your follow-up visit. °8. ACTIVITIES:  You may resume regular daily activities (gradually increasing) beginning the next day.  Wearing a good support bra or sports bra minimizes pain and swelling.  You may have sexual intercourse when it is comfortable. °a. You may drive when you no longer are taking prescription pain medication, you can comfortably wear a seatbelt, and you can safely maneuver your car and apply brakes. °b. RETURN TO WORK:  ______________________________________________________________________________________ °9. You should see your doctor in the office for a follow-up appointment approximately two weeks after your surgery.  Your doctor’s nurse will typically make your follow-up appointment when she calls you with your pathology report.  Expect your pathology report 2-3 business days after your surgery.  You may call to check if you do not hear from us after three days. °10. OTHER INSTRUCTIONS: _______________________________________________________________________________________________ _____________________________________________________________________________________________________________________________________ °_____________________________________________________________________________________________________________________________________ °_____________________________________________________________________________________________________________________________________ ° °WHEN TO CALL YOUR DOCTOR: °1. Fever over 101.0 °2. Nausea and/or vomiting. °3. Extreme swelling or bruising. °4. Continued bleeding from incision. °5. Increased pain, redness, or drainage from the incision. ° °The clinic staff is available to answer your questions during regular business hours.  Please don’t hesitate to call and ask to speak to one of the nurses for clinical concerns.  If you have a medical emergency, go to the nearest  emergency room or call 911.  A surgeon from [facility] is always on call at the hospital. ° °For further questions, please visit [email or web address]  ° ° °  Post Anesthesia Home Care Instructions ° °Activity: °Get plenty of rest for the remainder of the day. A responsible adult should stay with you for 24 hours following the procedure.  °For the next 24 hours, DO NOT: °-Drive a car °-Operate machinery °-Drink alcoholic beverages °-Take any medication unless instructed by your physician °-Make any legal decisions or sign important papers. ° °Meals: °Start with liquid foods such as gelatin or soup. Progress to regular foods as tolerated. Avoid greasy, spicy, heavy foods. If nausea and/or vomiting occur, drink only clear liquids until the nausea and/or vomiting subsides. Call your physician if vomiting continues. ° °Special Instructions/Symptoms: °Your throat may feel dry or sore from the anesthesia or the breathing tube placed in your throat during surgery. If this causes discomfort, gargle with warm salt water. The discomfort should disappear within 24 hours. ° °

## 2014-01-25 NOTE — Interval H&P Note (Signed)
History and Physical Interval Note:  01/25/2014 8:18 AM  [name]  has presented today for surgery, with the diagnosis of Right Breast Cancer  The various methods of treatment have been discussed with the patient and family. After consideration of risks, benefits and other options for treatment, the patient has consented to  Procedure(s): RIGHT BREAST LUMPECTOMY WITH RADIOACTIVE SEED LOCALIZATION  (Right) RIGHT SENTINEL LYMPH NODE MAPPING (Right) as a surgical intervention .  The patient's history has been reviewed, patient examined, no change in status, stable for surgery.  I have reviewed the patient's chart and labs.  Questions were answered to the patient's satisfaction.     Danisa Kopec A.

## 2014-01-26 ENCOUNTER — Encounter (HOSPITAL_BASED_OUTPATIENT_CLINIC_OR_DEPARTMENT_OTHER): Payer: Self-pay | Admitting: Surgery

## 2014-01-29 ENCOUNTER — Telehealth: Payer: Self-pay | Admitting: Oncology

## 2014-01-29 NOTE — Telephone Encounter (Signed)
, °

## 2014-02-01 ENCOUNTER — Other Ambulatory Visit: Payer: Self-pay

## 2014-02-07 ENCOUNTER — Ambulatory Visit: Payer: BLUE CROSS/BLUE SHIELD

## 2014-02-07 ENCOUNTER — Other Ambulatory Visit (HOSPITAL_BASED_OUTPATIENT_CLINIC_OR_DEPARTMENT_OTHER): Payer: BLUE CROSS/BLUE SHIELD

## 2014-02-07 ENCOUNTER — Ambulatory Visit: Payer: 59 | Admitting: Nurse Practitioner

## 2014-02-07 ENCOUNTER — Ambulatory Visit (HOSPITAL_BASED_OUTPATIENT_CLINIC_OR_DEPARTMENT_OTHER): Payer: BLUE CROSS/BLUE SHIELD

## 2014-02-07 ENCOUNTER — Ambulatory Visit: Payer: 59

## 2014-02-07 DIAGNOSIS — C50411 Malignant neoplasm of upper-outer quadrant of right female breast: Secondary | ICD-10-CM

## 2014-02-07 DIAGNOSIS — Z5112 Encounter for antineoplastic immunotherapy: Secondary | ICD-10-CM

## 2014-02-07 DIAGNOSIS — Z95828 Presence of other vascular implants and grafts: Secondary | ICD-10-CM

## 2014-02-07 LAB — CBC WITH DIFFERENTIAL/PLATELET
BASO%: 0.5 % (ref 0.0–2.0)
Basophils Absolute: 0 10*3/uL (ref 0.0–0.1)
EOS ABS: 0.1 10*3/uL (ref 0.0–0.5)
EOS%: 1.2 % (ref 0.0–7.0)
HCT: 35.6 % (ref 34.8–46.6)
HGB: 11.3 g/dL — ABNORMAL LOW (ref 11.6–15.9)
LYMPH#: 2.3 10*3/uL (ref 0.9–3.3)
LYMPH%: 35.7 % (ref 14.0–49.7)
MCH: 27.6 pg (ref 25.1–34.0)
MCHC: 31.7 g/dL (ref 31.5–36.0)
MCV: 87 fL (ref 79.5–101.0)
MONO#: 0.5 10*3/uL (ref 0.1–0.9)
MONO%: 7.3 % (ref 0.0–14.0)
NEUT%: 55.3 % (ref 38.4–76.8)
NEUTROS ABS: 3.6 10*3/uL (ref 1.5–6.5)
PLATELETS: 288 10*3/uL (ref 145–400)
RBC: 4.09 10*6/uL (ref 3.70–5.45)
RDW: 16 % — ABNORMAL HIGH (ref 11.2–14.5)
WBC: 6.4 10*3/uL (ref 3.9–10.3)

## 2014-02-07 LAB — COMPREHENSIVE METABOLIC PANEL (CC13)
ALBUMIN: 3.4 g/dL — AB (ref 3.5–5.0)
ALT: 27 U/L (ref 0–55)
ANION GAP: 11 meq/L (ref 3–11)
AST: 25 U/L (ref 5–34)
Alkaline Phosphatase: 79 U/L (ref 40–150)
BILIRUBIN TOTAL: 0.31 mg/dL (ref 0.20–1.20)
BUN: 10.6 mg/dL (ref 7.0–26.0)
CHLORIDE: 106 meq/L (ref 98–109)
CO2: 22 mEq/L (ref 22–29)
Calcium: 8.9 mg/dL (ref 8.4–10.4)
Creatinine: 0.8 mg/dL (ref 0.6–1.1)
EGFR: >90 mL/min/{1.73_m2} (ref >90)
GLUCOSE: 94 mg/dL (ref 70–140)
Potassium: 4.1 mEq/L (ref 3.5–5.1)
Sodium: 139 mEq/L (ref 136–145)
TOTAL PROTEIN: 6.9 g/dL (ref 6.4–8.3)

## 2014-02-07 MED ORDER — HEPARIN SOD (PORK) LOCK FLUSH 100 UNIT/ML IV SOLN
500.0000 [IU] | Freq: Once | INTRAVENOUS | Status: AC | PRN
  Administered 2014-02-07: 500 [IU]
  Filled 2014-02-07: qty 5

## 2014-02-07 MED ORDER — ACETAMINOPHEN 160 MG/5ML PO SOLN
650.0000 mg | Freq: Once | ORAL | Status: AC
  Administered 2014-02-07: 650 mg via ORAL
  Filled 2014-02-07: qty 20.3

## 2014-02-07 MED ORDER — DIPHENHYDRAMINE HCL 25 MG PO CAPS
ORAL_CAPSULE | ORAL | Status: AC
  Filled 2014-02-07: qty 1

## 2014-02-07 MED ORDER — SODIUM CHLORIDE 0.9 % IJ SOLN
10.0000 mL | INTRAMUSCULAR | Status: DC | PRN
  Administered 2014-02-07: 10 mL via INTRAVENOUS
  Filled 2014-02-07: qty 10

## 2014-02-07 MED ORDER — ACETAMINOPHEN 325 MG PO TABS
ORAL_TABLET | ORAL | Status: AC
  Filled 2014-02-07: qty 2

## 2014-02-07 MED ORDER — DIPHENHYDRAMINE HCL 25 MG PO CAPS
25.0000 mg | ORAL_CAPSULE | Freq: Once | ORAL | Status: AC
  Administered 2014-02-07: 25 mg via ORAL

## 2014-02-07 MED ORDER — TRASTUZUMAB CHEMO INJECTION 440 MG
861.0000 mg | Freq: Once | INTRAVENOUS | Status: AC
  Administered 2014-02-07: 861 mg via INTRAVENOUS
  Filled 2014-02-07: qty 41

## 2014-02-07 MED ORDER — ACETAMINOPHEN 325 MG PO TABS: 650.0000 mg | ORAL_TABLET | Freq: Once | ORAL | Status: DC

## 2014-02-07 MED ORDER — SODIUM CHLORIDE 0.9 % IJ SOLN
10.0000 mL | INTRAMUSCULAR | Status: DC | PRN
  Administered 2014-02-07: 10 mL
  Filled 2014-02-07: qty 10

## 2014-02-07 MED ORDER — SODIUM CHLORIDE 0.9 % IV SOLN
Freq: Once | INTRAVENOUS | Status: AC
  Administered 2014-02-07: 13:00:00 via INTRAVENOUS

## 2014-02-07 NOTE — Patient Instructions (Signed)

## 2014-02-07 NOTE — Patient Instructions (Signed)
[facility] Discharge Instructions for Patients Receiving Chemotherapy  Today you received the following chemotherapy agents herceptin   If you develop nausea and vomiting that is not controlled by your nausea medication, call the clinic.   BELOW ARE SYMPTOMS THAT SHOULD BE REPORTED IMMEDIATELY:  *FEVER GREATER THAN 100.5 F  *CHILLS WITH OR WITHOUT FEVER  NAUSEA AND VOMITING THAT IS NOT CONTROLLED WITH YOUR NAUSEA MEDICATION  *UNUSUAL SHORTNESS OF BREATH  *UNUSUAL BRUISING OR BLEEDING  TENDERNESS IN MOUTH AND THROAT WITH OR WITHOUT PRESENCE OF ULCERS  *URINARY PROBLEMS  *BOWEL PROBLEMS  UNUSUAL RASH Items with * indicate a potential emergency and should be followed up as soon as possible.  Feel free to call the clinic you have any questions or concerns. The clinic phone number is [phone].

## 2014-02-22 ENCOUNTER — Ambulatory Visit
Admission: RE | Admit: 2014-02-22 | Discharge: 2014-02-22 | Disposition: A | Discharge status: Home / Self Care | Payer: BLUE CROSS/BLUE SHIELD | Source: Ambulatory Visit | Attending: Radiation Oncology | Admitting: Radiation Oncology

## 2014-02-22 ENCOUNTER — Other Ambulatory Visit (HOSPITAL_BASED_OUTPATIENT_CLINIC_OR_DEPARTMENT_OTHER): Payer: BLUE CROSS/BLUE SHIELD

## 2014-02-22 ENCOUNTER — Encounter: Payer: Self-pay | Admitting: Radiation Oncology

## 2014-02-22 ENCOUNTER — Telehealth: Payer: Self-pay | Admitting: *Deleted

## 2014-02-22 ENCOUNTER — Ambulatory Visit (HOSPITAL_BASED_OUTPATIENT_CLINIC_OR_DEPARTMENT_OTHER): Payer: BLUE CROSS/BLUE SHIELD | Admitting: Oncology

## 2014-02-22 VITALS — BP 141/67 | HR 75 | Temp 97.5°F | Resp 18 | Ht 65.5 in | Wt 318.4 lb

## 2014-02-22 VITALS — BP 141/69 | HR 91 | Temp 97.9°F | Ht 65.5 in | Wt 319.6 lb

## 2014-02-22 DIAGNOSIS — C50411 Malignant neoplasm of upper-outer quadrant of right female breast: Secondary | ICD-10-CM

## 2014-02-22 DIAGNOSIS — Z170 Estrogen receptor positive status [ER+]: Secondary | ICD-10-CM

## 2014-02-22 LAB — COMPREHENSIVE METABOLIC PANEL (CC13)
ALT: 23 U/L (ref 0–55)
AST: 20 U/L (ref 5–34)
Albumin: 3.4 g/dL — ABNORMAL LOW (ref 3.5–5.0)
Alkaline Phosphatase: 81 U/L (ref 40–150)
Anion Gap: 10 mEq/L (ref 3–11)
BILIRUBIN TOTAL: 0.23 mg/dL (ref 0.20–1.20)
BUN: 11.1 mg/dL (ref 7.0–26.0)
CO2: 27 mEq/L (ref 22–29)
Calcium: 9.1 mg/dL (ref 8.4–10.4)
Chloride: 107 mEq/L (ref 98–109)
Creatinine: 0.8 mg/dL (ref 0.6–1.1)
Glucose: 90 mg/dl (ref 70–140)
Potassium: 3.9 mEq/L (ref 3.5–5.1)
SODIUM: 143 meq/L (ref 136–145)
Total Protein: 6.9 g/dL (ref 6.4–8.3)

## 2014-02-22 LAB — CBC WITH DIFFERENTIAL/PLATELET
BASO%: 0.7 % (ref 0.0–2.0)
Basophils Absolute: 0 10*3/uL (ref 0.0–0.1)
EOS%: 1.2 % (ref 0.0–7.0)
Eosinophils Absolute: 0.1 10*3/uL (ref 0.0–0.5)
HCT: 37.5 % (ref 34.8–46.6)
HGB: 11.5 g/dL — ABNORMAL LOW (ref 11.6–15.9)
LYMPH%: 36.9 % (ref 14.0–49.7)
MCH: 26.8 pg (ref 25.1–34.0)
MCHC: 30.8 g/dL — AB (ref 31.5–36.0)
MCV: 87.1 fL (ref 79.5–101.0)
MONO#: 0.4 10*3/uL (ref 0.1–0.9)
MONO%: 6.4 % (ref 0.0–14.0)
NEUT#: 3.6 10*3/uL (ref 1.5–6.5)
NEUT%: 54.8 % (ref 38.4–76.8)
PLATELETS: 336 10*3/uL (ref 145–400)
RBC: 4.3 10*6/uL (ref 3.70–5.45)
RDW: 15.7 % — ABNORMAL HIGH (ref 11.2–14.5)
WBC: 6.6 10*3/uL (ref 3.9–10.3)
lymph#: 2.4 10*3/uL (ref 0.9–3.3)

## 2014-02-22 NOTE — Progress Notes (Signed)
Location of Breast Cancer:  Invasive Ductal Right Breast, Upper Outer Quadrant  Histology per Pathology Report:  Diagnosis 1. Breast, lumpectomy - RESIDUAL INVASIVE DUCTAL CARCINOMA ASSOCIATED WITH NEOADJUVANT CHANGES AND CALCIFICATION, LARGEST RESIDUAL FOCUS MEASURES 0.6 CM IN GREATEST DIMENSION. - ASSOCIATED HIGH GRADE DUCTAL CARCINOMA IN SITU WITH CALCIFICATIONS AND NECROSIS. - CAUTERIZED ATYPICAL EPITHELIUM EXTREMELY CLOSE (LESS THAN 0.04 CM) TO MEDIAL MARGIN, SEE COMMENT. - OTHER MARGINS ARE NEGATIVE. - SEE ONCOLOGY TEMPLATE. 2. Breast, excision, right breast - BENIGN BREAST PARENCHYMA WITH FIBROCYSTIC CHANGES. - NO ATYPIA, HYPERPLASIA, OR MALIGNANCY IDENTIFIED. 1 of 4 FINAL for [name], [name] [phone]) Diagnosis(continued) 3. Lymph node, sentinel, biopsy, right - ONE BENIGN LYMPH NODE WITH NO TUMOR SEEN (0/1). 4. Lymph node, sentinel, biopsy, right - ONE BENIGN LYMPH NODE WITH NO TUMOR SEEN (0/1). 5. Lymph node, sentinel, biopsy, right - ONE BENIGN LYMPH NODE WITH NO TUMOR SEEN (0/1). 6. Lymph node, sentinel, biopsy, right - ONE BENIGN LYMPH NODE WITH NO TUMOR SEEN (0/1).  Diagnosis 1. Breast, right, needle core biopsy, mass, 10:30 - INVASIVE DUCTAL CARCINOMA, SEE COMMENT. - DUCTAL CARCINOMA IN SITU. 2. Lymph node, needle/core biopsy, right axilla node - ONE LYMPH NODE, NEGATIVE FOR TUMOR (0/1). SEE COMMENT    Receptor Status: ER(49%), PR (47%), Her2-neu (0), Ki-67(52%)  Did patient present with symptoms (if so, please note symptoms) or was this found on screening mammography?: [name] states she felt a nodule in her right breast which prompted a mammogram with resulting above diagnosis  Past/Anticipated interventions by surgeon, if DEY:CXKGYJEHUD right Breast  Past/Anticipated interventions by medical oncology, if any: Chemotherapy: neoadjuvant chemotherapy to consist of carboplatin, docetaxel, trastuzumab and pertuzumab for 6 cycles, starting  09/07/2013, (a) pertuzumab discontinued after cycle 1 because of severe diarrhea.  Trastuzumab to continue to complete one year (through August 2016); Echocardiogram on 08/25/13 demonstrated a LVEF of 60-65%.  Lymphedema issues, if any:    Pain issues, if any: C/o aching of right breast near her axilla today which she attributes to her bra. Level 1/10 pain which increases when bending over.  SAFETY ISSUES:  Prior radiation? No  Pacemaker/ICD? No  Possible current pregnancy? No  Is the patient on methotrexate? No  Current Complaints / other details:    Patient's last menstrual period was 10/01/2013. Menarche age 83. G68, P, BC x 10 years

## 2014-02-22 NOTE — Addendum Note (Signed)
Encounter addended by: Rexene Edison, MD on: 02/22/2014  3:27 PM<BR>     Documentation filed: Dx Association, Flowsheet VN, Orders

## 2014-02-22 NOTE — Telephone Encounter (Signed)
Per staff message and POF I have scheduled appts. Advised scheduler of appts. JMW  

## 2014-02-22 NOTE — Progress Notes (Signed)
[facility]  [phone] [phone]     ID: Talicia Sui DOB: [birth year]  MR#: [id number]  JJK#:[id number]  Patient Care Team: Horatio Pel, MD as PCP - General (Internal Medicine) Erroll Luna, MD as Consulting Physician (General Surgery) Chauncey Cruel, MD as Consulting Physician (Oncology) Allena Katz, MD as Consulting Physician (Obstetrics and Gynecology)  CHIEF COMPLAINT:  triple positive breast cancer  CURRENT TREATMENT:  Trastuzumab, adjuvant radiation  BREAST CANCER HISTORY: From the original intake note:  The patient herself palpated a mass in her right breast mid July. She brought it to Dr. Sherlynn Stalls attention and he set the patient up for bilateral diagnostic mammography and right breast ultrasonography of the breast Center 07/28/2013. Right mammogram showed an irregular mass in the upper outer quadrant measuring 1.4 cm. There was some associated pleomorphic microcalcifications. This was palpable, at the 10:30 position 11 cm from the nipple. By palpation it measured approximately 1.5 cm. Ultrasound confirmed an hypoechoic mass with irregular borders measuring 1.1 cm. The right axilla showed a few adjacent deep lymph nodes with mild cortical thickening.  On 08/08/2013 the patient underwent biopsy of the breast mass in question. The pathology ([id number]) showed an invasive ductal carcinoma, grade 2, estrogen receptor 49% positive, progesterone receptor 47% positive, both with moderate staining intensity, with an MIB-1 of 52%, and HER-2 amplification with a signals ratio of 8.1. The right axillary lymph node biopsied at the same time was benign this was felt to be possibly concordant.  And 08/15/2013 the patient underwent bilateral breast MRI showing a 2 cm irregular enhancing mass in the upper outer quadrant of the right breast. There were no additional changes no abnormal appearing lymph nodes and no masses or abnormalities in  the left breast.  The patient's subsequent history is as detailed below  INTERVAL HISTORY: Nevah returns today for follow up of her breast cancer. Since her last visit here she underwent right lumpectomy and sentinel lymph node sampling. The final pathology ([id number]) showed a a few residual nests of invasive ductal carcinoma, the largest measuring 6 mm . This was grade 2. Margins were negative for the invasive disease, apparently negative though very close for the in situ disease. Repeat HER-2 was again amplified. A total of 4 sentinel lymph nodes were removed, all of which were clear. -- Toy continues on trastuzumab, which she is tolerating well. She is due for repeat echocardiography. She also met with Dr. Valere Dross earlier today and her case was presented at conference yesterday, when it was suggested repeat mammography should be obtained prior to radiation to make sure all the areas of calcification have been removed.   REVIEW OF SYSTEMS: Lokelani did fine with her recent surgery. She stopped having periods after her first chemotherapy cycle and they have not resumed. She is having significant hot flashes although she says they are better. Her nails are darkened ridged but she has not lost any. She does have insomnia. She has a little bit of a runny nose, but no cough or shortness of breath. She has back and joint pain which is not more persistent or intense than prior. She feels forgetful. There is little bit of peripheral neuropathy but that is improving. A detailed review of systems today was otherwise stable.  PAST MEDICAL HISTORY: Past Medical History  Diagnosis Date  . Anxiety   . Wears contact lenses   . Snores   . Malignant neoplasm of breast (female), unspecified site  PAST SURGICAL HISTORY: Past Surgical History  Procedure Laterality Date  . Wisdom tooth extraction    . Portacath placement N/A 08/23/2013    Procedure: INSERTION PORT-A-CATH WITH ULTRA SOUND;  Surgeon: Joyice Faster. Cornett, MD;  Location: [facility];  Service: General;  Laterality: N/A;  . Breast lumpectomy with radioactive seed localization Right 01/25/2014    Procedure: RIGHT BREAST LUMPECTOMY WITH RADIOACTIVE SEED LOCALIZATION ;  Surgeon: Erroll Luna, MD;  Location: [facility];  Service: General;  Laterality: Right;  . Axillary sentinel node biopsy Right 01/25/2014    Procedure: RIGHT SENTINEL LYMPH NODE MAPPING;  Surgeon: Erroll Luna, MD;  Location: [facility];  Service: General;  Laterality: Right;    FAMILY HISTORY Family History  Problem Relation Age of Onset  . Uterine cancer Mother 69    TAH/BSO  . Colon cancer Father 68    deceased 80  . Leukemia Maternal Grandmother     deceased 86  . Skin cancer Paternal Grandmother   . Prostate cancer Other    the patient's father died from colon cancer the age of 25. It had been diagnosed 3 years prior. The patient's mother was diagnosed with cancer of the uterus at age 10. She is 45 years old as of August 2015. The patient had no brothers or sisters. There is a history of prostate cancer leukemia at in the family as well as melanoma, but there is no history of breast or ovarian cancer in the family.  GYNECOLOGIC HISTORY:  Patient's last menstrual period was 10/01/2013. Menarche age 69. The patient is GX P0. She used oral contraceptives for several years remotely with no complications. She stopped having periods after first cycle of chemotherapy.  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 She lives alone, with no pets.    ADVANCED DIRECTIVES: Not in place. At her 08/16/2013 visit the patient was given the upper. Documents 2 complete and notarize so she may name her mother, [name] focal as her healthcare power of attorney, which is the patient's intention. Ms. focal can be reached at [phone]. She currently lives in [place] but is planning to move to [place]: History  Substance Use Topics  . Smoking status: Former Smoker    Quit date: 02/20/2013  . Smokeless tobacco: Not on file  . Alcohol Use: Yes     Comment: 1/week     Colonoscopy: Never  PAP: July 2015 Bone density: Never  Lipid panel:  Allergies  Allergen Reactions  . Amoxicillin Rash  . Chocolate Rash    Only Hershey's brand    Current Outpatient Prescriptions  Medication Sig Dispense Refill  . acetaminophen (TYLENOL) 160 MG/5ML liquid Take 20.3 mLs (650 mg total) by mouth once. (Patient not taking: Reported on 12/27/2013) 120 mL 0  . Calcium-Vitamin D-Vitamin K (CALCIUM SOFT CHEWS PO) Take by mouth daily.    . cholestyramine (QUESTRAN) 4 G packet Take 1 packet (4 g total) by mouth 3 (three) times daily with meals. (Patient not taking: Reported on 12/06/2013) 60 each 12  . clindamycin (CLEOCIN) 300 MG capsule Take 1 capsule (300 mg total) by mouth 3 (three) times daily. 21 capsule 0  . hydrocortisone (ANUSOL-HC) 25 MG suppository Place 1 suppository (25 mg total) rectally 2 (two) times daily. (Patient not taking: Reported on 12/27/2013) 12 suppository 1  . loratadine (CLARITIN) 10 MG tablet Take 10 mg by  mouth daily as needed for allergies.    Marland Kitchen LORazepam (ATIVAN) 0.5 MG tablet TAKE 1 TABLET BY MOUTH AT BEDTIME AS NEEDED (NAUSEA OR VOMITING) 30 tablet 0  . Multiple Vitamins-Minerals (MULTI ADULT GUMMIES PO) Take 2 each by mouth daily.    Marland Kitchen oxyCODONE-acetaminophen (ROXICET) 5-325 MG per tablet Take 1-2 tablets by mouth every 4 (four) hours as needed. 30 tablet 0  . UNABLE TO FIND Apply 1 each topically once. Med Name: Dispense per medical necessity cranial prothesis for this patient with alopecia secondary to chemotherapy due to breast cancer history. 1 each 0   No current facility-administered medications for this visit.   Facility-Administered Medications Ordered in Other Visits  Medication Dose Route Frequency Provider Last Rate Last Dose  .  sodium chloride 0.9 % injection 10 mL  10 mL Intravenous PRN Chauncey Cruel, MD   10 mL at 11/30/13 1102    OBJECTIVE: Middle-aged [place] American woman in no acute distress Filed Vitals:   02/22/14 1237  BP: 141/67  Pulse: 75  Temp: 97.5 F (36.4 C)  Resp: 18     Body mass index is 52.16 kg/(m^2).     ECOG FS:1 - Symptomatic but completely ambulatory   Sclerae unicteric, pupils round and equal Oropharynx clear and moist No cervical or supraclavicular adenopathy Lungs no rales or rhonchi Heart regular rate and rhythm Abd soft, obese, nontender, positive bowel sounds MSK no focal spinal tenderness, no upper extremity lymphedema Neuro: nonfocal, well oriented, appropriate affect Breasts: The right breast is status post recent lumpectomy. The incision is healing very nicely. There is no dehiscence or erythema. There is some fluid in the breast, which is distorting the inferior profile. The right axilla is benign. The left breast is unremarkable Skin: She has Beau lines and hyperpigmentation over the nails.   LAB RESULTS:  CMP     Component Value Date/Time   NA 139 02/07/2014 1216   K 4.1 02/07/2014 1216   CO2 22 02/07/2014 1216   GLUCOSE 94 02/07/2014 1216   BUN 10.6 02/07/2014 1216   CREATININE 0.8 02/07/2014 1216   CALCIUM 8.9 02/07/2014 1216   PROT 6.9 02/07/2014 1216   ALBUMIN 3.4* 02/07/2014 1216   AST 25 02/07/2014 1216   ALT 27 02/07/2014 1216   ALKPHOS 79 02/07/2014 1216   BILITOT 0.31 02/07/2014 1216    I No results found for: SPEP  Lab Results  Component Value Date   WBC 6.6 02/22/2014   NEUTROABS 3.6 02/22/2014   HGB 11.5* 02/22/2014   HCT 37.5 02/22/2014   MCV 87.1 02/22/2014   PLT 336 02/22/2014      Chemistry      Component Value Date/Time   NA 139 02/07/2014 1216   K 4.1 02/07/2014 1216   CO2 22 02/07/2014 1216   BUN 10.6 02/07/2014 1216   CREATININE 0.8 02/07/2014 1216      Component Value Date/Time   CALCIUM 8.9 02/07/2014 1216     ALKPHOS 79 02/07/2014 1216   AST 25 02/07/2014 1216   ALT 27 02/07/2014 1216   BILITOT 0.31 02/07/2014 1216       No results found for: LABCA2  No components found for: LABCA125  No results for input(s): INR in the last 168 hours.  Urinalysis No results found for: COLORURINE  STUDIES: Most recent echocardiogram on 11/28/13 showed an ejection fraction of 65-70%.  ASSESSMENT: 44 y.o. BRCA negative [place] woman status post right breast upper outer quadrant biopsy 08/08/2013 for a  triple positive invasive ductal carcinoma, grade 2, with an MIB-1 of 52%, and a suspicious lymph node biopsied at the same time pathologically benign.  (1) genetics testing found no mutations in ATM, BARD1, BRCA1, BRCA2, BRIP1, CDH1, CHEK2, MRE11A, MUTYH, NBN, NF1, PALB2, PTEN, RAD50, RAD51C, RAD51D, and TP53  (2) neoadjuvant chemotherapy consisted of carboplatin, docetaxel, trastuzumab and pertuzumab for 6 cycles, completed 12/21/2013  (a) pertuzumab discontinued after cycle 1 because of severe diarrhea  (b) carboplatin switched for cyclophosphamide because of hypersensitivity reaction on 11/20/13  (c) docetaxel removed from final cycle because of peripheral neuropathy symptoms.  (3) trastuzumab to continue to complete one year (through August 2016)  (a) repeat echocardiogram due February 2016  (4) status post right lumpectomy and sentinel lymph node sampling 01/25/2014 showing a residual pT1b pN0 invasive ductal carcinoma, grade 2, again HER-2 amplified  (5) adjuvant radiation to follow surgery  (6) anti-estrogens to follow radiation.  PLAN: Almira did terrific with her surgery. She did not get a complete pathologic response, but those are less common in patients whose tumors are estrogen receptor positive. The margins were negative for the invasive disease, apparently quite close for DCIS. Accordingly Dr. Valere Dross will be obtaining a repeat mammogram later this month, to follow the calcifications  noted previously, before starting radiation.  Lundynn is a little bit behind on her echo and I have put an order for it to be done sometime this week. She will receive her next Herceptin treatment next week, and of course every 21 days through mid August.  She is going to see me with her April Herceptin. At that time we will discuss antiestrogen therapy. I will obtain an [place] and estradiol level prior to that visit. She is aware of the fact that if her periods were to resume we would want to consider goserelin versus salpingo-oophorectomy.  She has a good understanding of the overall plan. She agrees with it. She knows the goal of treatment in her case is cure. She will call with any problem that may develop before her next visit here.  Chauncey Cruel, MD 02/22/2014 12:47 PM

## 2014-02-22 NOTE — Addendum Note (Signed)
Addended by: Laureen Abrahams on: 02/22/2014 05:34 PM   Modules accepted: Orders, Medications

## 2014-02-22 NOTE — Progress Notes (Signed)
CC: Dr. Erroll Luna, Dr. Deland Pretty, Dr. Gunnar Bulla Magrinat  Follow-up note:  Diagnosis: Pathologic [name] IA (ypT1b ypN0, M0) invasive ductal/DCIS of the right breast  History: [name] is a pleasant 44 year old female who is seen today for review and scheduling of her right breast radiation therapy in the management of her pathologic [name] ypT1b ypN0 invasive ductal/DCIS of the right breast.  I first saw her at the [facility] on 08/16/2013.  At the time of screening mammography on July 17 she was noted to have a right breast mass with calcifications at 10:30. Ultrasound showed a 1.1 x 0.9 cm mass along with 2-3 lymph nodes were thickened cortices. On July 28 she underwent biopsy of the right breast mass and an axillary lymph node. The lymph node was benign, and the right breast mass was diagnostic for invasive ductal/DCIS. The tumor was ER positive at 49%, PR +47% with an elevated Ki-67 of 52%. HER-2/neu was amplified. Breast MR on August 4 should a 2.0 cm mass within the upper-outer quadrant of the right breast with no other suspicious lesions.  She went onto receive neoadjuvant chemotherapy with Herceptin with interval MR scan on 10/27/2013 showing interval reduction in the size of her enhancing mass within the upper-outer quadrant of the right breast.  On 01/25/2014 she underwent a right partial mastectomy and sentinel lymph node biopsy by Dr. Brantley [name].  She was found to have 2 residual tumor foci of invasive ductal carcinoma in the ill-defined mass.  The largest tumor focus was 0.6 cm in greatest dimension.  The closest margin being 0.3 cm.  She also had a residual high-grade DCIS with calcifications and necrosis.  A cauterized margin showed atypical epithelium, less than 0.04 cm to the medial margin.  The findings were worrisome for high-grade DCIS there was definitive DCIS 0.15 cm away from the medial margin.  4 sentinel lymph nodes were free of metastatic disease.  She is without complaints today except for  some residual right breast discomfort as expected.  Of note is that she was BRCA1 and 2 negative at the time of genetic testing.  Physical examination: Alert and oriented. Filed Vitals:   02/22/14 1100  BP: 141/69  Pulse: 91  Temp: 97.9 F (36.6 C)   Head and neck examination: Grossly unremarkable.  Nodes: Without palpable cervical, supraclavicular, or axillary lymphadenopathy.  Chest: Lungs clear.  Breasts: There is a partial mastectomy wound along the upper-outer quadrant of the right breast extending from 10 to 11:00.  There may be a small seroma deep to the wound.  Extremities: Without edema.   Impression:   Pathologic [name] IA (ypT1b ypN0, M0) invasive ductal/DCIS of the right breast.  She desires breast preservation.  I feel that her margins are adequate, but I would like to obtain a baseline right breast mammogram to confirm removal of all suspicious microcalcifications.  This can be performed when she is more comfortable.  Afterwards, we will have her visit [place] for CT simulation.  We discussed the potential acute and late toxicities of radiation therapy and consent is signed today.  She is not a candidate for hypofractionated treatment because of her breast size.  She will continue with Herceptin for a full year.  Plan: As above.  30 minutes was spent face-to-face with the patient, primarily counseling the patient and coordinating her care.

## 2014-02-23 ENCOUNTER — Telehealth: Payer: Self-pay | Admitting: *Deleted

## 2014-02-23 NOTE — Telephone Encounter (Signed)
perpof ot sch pt appts-sent to MW to sch pt trmt-sent to Vaughan Basta to pre-cert for [employer] call pt after reply

## 2014-02-23 NOTE — Telephone Encounter (Signed)
CALLED PATIENT TO INFORM OF MAMMOGRAM ON 03-06-14- ARRIVAL TIME - 10:40 AM @ THE [facility] AND HER SIM ON 03/08/14 @ 10:00 AM @ DR. MURRAY'S OFFICE, SPOKE WITH PATIENT AND SHE IS AWARE OF THESE APPTS.

## 2014-02-28 ENCOUNTER — Telehealth: Payer: Self-pay | Admitting: Nurse Practitioner

## 2014-02-28 ENCOUNTER — Other Ambulatory Visit: Payer: BLUE CROSS/BLUE SHIELD

## 2014-02-28 ENCOUNTER — Ambulatory Visit: Payer: 59

## 2014-02-28 NOTE — Telephone Encounter (Signed)
per HF to sch pt for 2/18 flush/herceptin-pt aware

## 2014-03-01 ENCOUNTER — Ambulatory Visit (HOSPITAL_BASED_OUTPATIENT_CLINIC_OR_DEPARTMENT_OTHER): Payer: BLUE CROSS/BLUE SHIELD

## 2014-03-01 ENCOUNTER — Other Ambulatory Visit (HOSPITAL_BASED_OUTPATIENT_CLINIC_OR_DEPARTMENT_OTHER): Payer: BLUE CROSS/BLUE SHIELD

## 2014-03-01 ENCOUNTER — Telehealth: Payer: Self-pay | Admitting: Nurse Practitioner

## 2014-03-01 ENCOUNTER — Ambulatory Visit: Payer: BLUE CROSS/BLUE SHIELD

## 2014-03-01 DIAGNOSIS — C50411 Malignant neoplasm of upper-outer quadrant of right female breast: Secondary | ICD-10-CM

## 2014-03-01 DIAGNOSIS — Z95828 Presence of other vascular implants and grafts: Secondary | ICD-10-CM

## 2014-03-01 DIAGNOSIS — Z5112 Encounter for antineoplastic immunotherapy: Secondary | ICD-10-CM

## 2014-03-01 LAB — COMPREHENSIVE METABOLIC PANEL (CC13)
ALBUMIN: 3.5 g/dL (ref 3.5–5.0)
ALT: 19 U/L (ref 0–55)
AST: 28 U/L (ref 5–34)
Alkaline Phosphatase: 84 U/L (ref 40–150)
Anion Gap: 10 mEq/L (ref 3–11)
BUN: 15.3 mg/dL (ref 7.0–26.0)
CHLORIDE: 107 meq/L (ref 98–109)
CO2: 23 meq/L (ref 22–29)
Calcium: 9.2 mg/dL (ref 8.4–10.4)
Creatinine: 0.8 mg/dL (ref 0.6–1.1)
EGFR: >90 mL/min/{1.73_m2} (ref >90)
Glucose: 94 mg/dl (ref 70–140)
Potassium: 3.9 mEq/L (ref 3.5–5.1)
SODIUM: 141 meq/L (ref 136–145)
Total Bilirubin: 0.23 mg/dL (ref 0.20–1.20)
Total Protein: 7.1 g/dL (ref 6.4–8.3)

## 2014-03-01 LAB — CBC WITH DIFFERENTIAL/PLATELET
BASO%: 0.2 % (ref 0.0–2.0)
Basophils Absolute: 0 10*3/uL (ref 0.0–0.1)
EOS ABS: 0.1 10*3/uL (ref 0.0–0.5)
EOS%: 1.4 % (ref 0.0–7.0)
HEMATOCRIT: 37.7 % (ref 34.8–46.6)
HEMOGLOBIN: 11.8 g/dL (ref 11.6–15.9)
LYMPH%: 39.7 % (ref 14.0–49.7)
MCH: 26.9 pg (ref 25.1–34.0)
MCHC: 31.3 g/dL — ABNORMAL LOW (ref 31.5–36.0)
MCV: 85.7 fL (ref 79.5–101.0)
MONO#: 0.5 10*3/uL (ref 0.1–0.9)
MONO%: 7.3 % (ref 0.0–14.0)
NEUT#: 3.6 10*3/uL (ref 1.5–6.5)
NEUT%: 51.4 % (ref 38.4–76.8)
Platelets: 309 10*3/uL (ref 145–400)
RBC: 4.4 10*6/uL (ref 3.70–5.45)
RDW: 15.9 % — AB (ref 11.2–14.5)
WBC: 7 10*3/uL (ref 3.9–10.3)
lymph#: 2.8 10*3/uL (ref 0.9–3.3)

## 2014-03-01 MED ORDER — HEPARIN SOD (PORK) LOCK FLUSH 100 UNIT/ML IV SOLN
500.0000 [IU] | Freq: Once | INTRAVENOUS | Status: AC | PRN
  Administered 2014-03-01: 500 [IU]
  Filled 2014-03-01: qty 5

## 2014-03-01 MED ORDER — SODIUM CHLORIDE 0.9 % IJ SOLN
10.0000 mL | INTRAMUSCULAR | Status: DC | PRN
  Administered 2014-03-01: 10 mL
  Filled 2014-03-01: qty 10

## 2014-03-01 MED ORDER — DIPHENHYDRAMINE HCL 25 MG PO CAPS
25.0000 mg | ORAL_CAPSULE | Freq: Once | ORAL | Status: AC
  Administered 2014-03-01: 25 mg via ORAL

## 2014-03-01 MED ORDER — ACETAMINOPHEN 325 MG PO TABS
650.0000 mg | ORAL_TABLET | Freq: Once | ORAL | Status: AC
  Administered 2014-03-01: 650 mg via ORAL

## 2014-03-01 MED ORDER — DIPHENHYDRAMINE HCL 25 MG PO CAPS
ORAL_CAPSULE | ORAL | Status: AC
  Filled 2014-03-01: qty 1

## 2014-03-01 MED ORDER — SODIUM CHLORIDE 0.9 % IV SOLN
Freq: Once | INTRAVENOUS | Status: AC
  Administered 2014-03-01: 09:00:00 via INTRAVENOUS

## 2014-03-01 MED ORDER — ACETAMINOPHEN 325 MG PO TABS
ORAL_TABLET | ORAL | Status: AC
  Filled 2014-03-01: qty 2

## 2014-03-01 MED ORDER — TRASTUZUMAB CHEMO INJECTION 440 MG
6.0000 mg/kg | Freq: Once | INTRAVENOUS | Status: AC
  Administered 2014-03-01: 840 mg via INTRAVENOUS
  Filled 2014-03-01: qty 40

## 2014-03-01 MED ORDER — SODIUM CHLORIDE 0.9 % IJ SOLN
10.0000 mL | INTRAMUSCULAR | Status: DC | PRN
  Administered 2014-03-01: 10 mL via INTRAVENOUS
  Filled 2014-03-01: qty 10

## 2014-03-01 NOTE — Telephone Encounter (Signed)
per Vaughan Basta no pre-cert req-sch ECHO-cld pt & left a time/date/location of ECHO-adv if not good time for her to call [phone]

## 2014-03-01 NOTE — Patient Instructions (Signed)
[facility] Discharge Instructions for Patients Receiving Chemotherapy  Today you received the following chemotherapy agents: Herceptin  To help prevent nausea and vomiting after your treatment, we encourage you to take your nausea medication as prescribed by your physician.    If you develop nausea and vomiting that is not controlled by your nausea medication, call the clinic.   BELOW ARE SYMPTOMS THAT SHOULD BE REPORTED IMMEDIATELY:  *FEVER GREATER THAN 100.5 F  *CHILLS WITH OR WITHOUT FEVER  NAUSEA AND VOMITING THAT IS NOT CONTROLLED WITH YOUR NAUSEA MEDICATION  *UNUSUAL SHORTNESS OF BREATH  *UNUSUAL BRUISING OR BLEEDING  TENDERNESS IN MOUTH AND THROAT WITH OR WITHOUT PRESENCE OF ULCERS  *URINARY PROBLEMS  *BOWEL PROBLEMS  UNUSUAL RASH Items with * indicate a potential emergency and should be followed up as soon as possible.  Feel free to call the clinic you have any questions or concerns. The clinic phone number is [phone].    

## 2014-03-01 NOTE — Patient Instructions (Signed)

## 2014-03-06 ENCOUNTER — Ambulatory Visit
Admission: RE | Admit: 2014-03-06 | Discharge: 2014-03-06 | Disposition: A | Discharge status: Home / Self Care | Payer: BLUE CROSS/BLUE SHIELD | Source: Ambulatory Visit | Attending: Radiation Oncology | Admitting: Radiation Oncology

## 2014-03-06 ENCOUNTER — Ambulatory Visit (HOSPITAL_COMMUNITY)
Admission: RE | Admit: 2014-03-06 | Discharge: 2014-03-06 | Disposition: A | Discharge status: Home / Self Care | Payer: BLUE CROSS/BLUE SHIELD | Source: Ambulatory Visit | Attending: Nurse Practitioner | Admitting: Nurse Practitioner

## 2014-03-06 DIAGNOSIS — C50411 Malignant neoplasm of upper-outer quadrant of right female breast: Secondary | ICD-10-CM | POA: Diagnosis present

## 2014-03-06 DIAGNOSIS — Z5111 Encounter for antineoplastic chemotherapy: Secondary | ICD-10-CM

## 2014-03-06 NOTE — Progress Notes (Signed)
  Echocardiogram 2D Echocardiogram has been performed.  [name] 03/06/2014, 11:45 AM

## 2014-03-08 ENCOUNTER — Ambulatory Visit
Admission: RE | Admit: 2014-03-08 | Discharge: 2014-03-08 | Disposition: A | Discharge status: Home / Self Care | Payer: BLUE CROSS/BLUE SHIELD | Source: Ambulatory Visit | Attending: Radiation Oncology | Admitting: Radiation Oncology

## 2014-03-08 DIAGNOSIS — C50411 Malignant neoplasm of upper-outer quadrant of right female breast: Secondary | ICD-10-CM

## 2014-03-08 NOTE — Progress Notes (Signed)
Complex simulation/treatment planning note: The patient was taken to the CT simulator.  A Vac lock immobilization device was constructed on a custom breast board.  Her right breast field borders were marked with radiopaque wires.  She was then scanned.  The CT data set was sent to the planning system where contoured her right breast tumor bed.  She was set up to medial and lateral right breast tangents with unique MLCs.  She is now ready for 3-D simulation.  I prescribing 4500 cGy in 25 sessions utilizing mixed energy photons.  This will be followed by a boost to her tumor bed for a further 1600 cGy in 8 sessions.

## 2014-03-12 ENCOUNTER — Encounter: Payer: Self-pay | Admitting: Radiation Oncology

## 2014-03-12 ENCOUNTER — Encounter: Payer: Self-pay | Admitting: Oncology

## 2014-03-12 DIAGNOSIS — C50411 Malignant neoplasm of upper-outer quadrant of right female breast: Secondary | ICD-10-CM | POA: Diagnosis not present

## 2014-03-12 NOTE — Progress Notes (Signed)
3-D simulation note: The patient completed 3-D simulation for treatment to her right breast.  Dose fine histograms were obtained for the target structures and also avoidance structures including the lungs and heart.  The 94% isodose curve cover the right breast tumor bed.  This was a compromise to avoid exceeding our target goals for high-dose regions within the breast.  Each tangent was treated with 1 [facility] and also electronic compensation for a total four complex treatment devices.  I prescribing 4500 cGy in 25 sessions utilizing mixed 10 MV and 15 MV photons.  She will then receive a boost of approximately 1600 cGy in 8 sessions after tangential field radiation.

## 2014-03-12 NOTE — Progress Notes (Signed)
Sending form to [employer] for [phone]

## 2014-03-15 ENCOUNTER — Ambulatory Visit: Payer: BLUE CROSS/BLUE SHIELD | Admitting: Radiation Oncology

## 2014-03-16 ENCOUNTER — Ambulatory Visit
Admission: RE | Admit: 2014-03-16 | Discharge: 2014-03-16 | Disposition: A | Discharge status: Home / Self Care | Payer: BLUE CROSS/BLUE SHIELD | Source: Ambulatory Visit | Attending: Radiation Oncology | Admitting: Radiation Oncology

## 2014-03-16 ENCOUNTER — Encounter: Payer: Self-pay | Admitting: Radiation Oncology

## 2014-03-16 DIAGNOSIS — C50411 Malignant neoplasm of upper-outer quadrant of right female breast: Secondary | ICD-10-CM | POA: Diagnosis not present

## 2014-03-16 NOTE — Progress Notes (Signed)
See dictated note from earlier today. 

## 2014-03-16 NOTE — Progress Notes (Signed)
Simulation verification note: The patient underwent simulation verification today for treatment to her right breast. Her isocenter is in good position and the multileaf collimators contoured the treatment volume appropriately. 

## 2014-03-19 ENCOUNTER — Ambulatory Visit
Admission: RE | Admit: 2014-03-19 | Discharge: 2014-03-19 | Disposition: A | Discharge status: Home / Self Care | Payer: BLUE CROSS/BLUE SHIELD | Source: Ambulatory Visit | Attending: Radiation Oncology | Admitting: Radiation Oncology

## 2014-03-19 ENCOUNTER — Encounter: Payer: Self-pay | Admitting: Radiation Oncology

## 2014-03-19 ENCOUNTER — Ambulatory Visit: Payer: BLUE CROSS/BLUE SHIELD

## 2014-03-19 VITALS — BP 145/85 | HR 86 | Temp 98.0°F | Resp 20 | Wt 313.4 lb

## 2014-03-19 DIAGNOSIS — C50411 Malignant neoplasm of upper-outer quadrant of right female breast: Secondary | ICD-10-CM

## 2014-03-19 MED ORDER — RADIAPLEXRX EX GEL: Freq: Once | CUTANEOUS | Status: DC

## 2014-03-19 MED ORDER — ALRA NON-METALLIC DEODORANT (RAD-ONC): 1.0000 "application " | Freq: Once | TOPICAL | Status: DC

## 2014-03-19 NOTE — Progress Notes (Signed)
Patient weekly rad tx right breast,1st tx, patient education done, radiation therapy and you book, alra deodorant, radiaplex gel, discussed ways to manage fatigue, pain, skin irritatin, use radiaplex bid after rad tx and bedtime, all questions answered, no c/o pain 2:54 PM

## 2014-03-19 NOTE — Progress Notes (Signed)
Weekly Management Note:  Site: Right breast Current Dose:  180  cGy Projected Dose: 4500  cGy followed by right breast boost of 1600 cGy in 8 sessions  Narrative: The patient is seen today for routine under treatment assessment. CBCT/MVCT images/port films were reviewed. The chart was reviewed.   She is without complaints today.  She underwent patient education.  Physical Examination:  Filed Vitals:   03/19/14 1451  BP: 145/85  Pulse: 86  Temp: 98 F (36.7 C)  Resp: 20  .  Weight: 313 lb 6.4 oz (142.157 kg).  No skin changes.  Impression: Tolerating radiation therapy well.  Plan: Continue radiation therapy as planned.

## 2014-03-20 ENCOUNTER — Ambulatory Visit
Admission: RE | Admit: 2014-03-20 | Discharge: 2014-03-20 | Disposition: A | Discharge status: Home / Self Care | Payer: BLUE CROSS/BLUE SHIELD | Source: Ambulatory Visit | Attending: Radiation Oncology | Admitting: Radiation Oncology

## 2014-03-20 ENCOUNTER — Ambulatory Visit: Payer: BLUE CROSS/BLUE SHIELD

## 2014-03-20 DIAGNOSIS — C50411 Malignant neoplasm of upper-outer quadrant of right female breast: Secondary | ICD-10-CM | POA: Diagnosis not present

## 2014-03-21 ENCOUNTER — Ambulatory Visit
Admission: RE | Admit: 2014-03-21 | Discharge: 2014-03-21 | Disposition: A | Discharge status: Home / Self Care | Payer: BLUE CROSS/BLUE SHIELD | Source: Ambulatory Visit | Attending: Radiation Oncology | Admitting: Radiation Oncology

## 2014-03-21 ENCOUNTER — Ambulatory Visit: Payer: BLUE CROSS/BLUE SHIELD

## 2014-03-21 DIAGNOSIS — C50411 Malignant neoplasm of upper-outer quadrant of right female breast: Secondary | ICD-10-CM | POA: Diagnosis not present

## 2014-03-22 ENCOUNTER — Ambulatory Visit: Payer: BLUE CROSS/BLUE SHIELD

## 2014-03-22 ENCOUNTER — Other Ambulatory Visit (HOSPITAL_BASED_OUTPATIENT_CLINIC_OR_DEPARTMENT_OTHER): Payer: BLUE CROSS/BLUE SHIELD

## 2014-03-22 ENCOUNTER — Ambulatory Visit (HOSPITAL_BASED_OUTPATIENT_CLINIC_OR_DEPARTMENT_OTHER): Payer: BLUE CROSS/BLUE SHIELD

## 2014-03-22 ENCOUNTER — Ambulatory Visit
Admission: RE | Admit: 2014-03-22 | Discharge: 2014-03-22 | Disposition: A | Discharge status: Home / Self Care | Payer: BLUE CROSS/BLUE SHIELD | Source: Ambulatory Visit | Attending: Radiation Oncology | Admitting: Radiation Oncology

## 2014-03-22 VITALS — BP 118/85 | HR 94 | Temp 97.9°F | Resp 19

## 2014-03-22 DIAGNOSIS — C50411 Malignant neoplasm of upper-outer quadrant of right female breast: Secondary | ICD-10-CM

## 2014-03-22 DIAGNOSIS — Z95828 Presence of other vascular implants and grafts: Secondary | ICD-10-CM

## 2014-03-22 DIAGNOSIS — Z5112 Encounter for antineoplastic immunotherapy: Secondary | ICD-10-CM

## 2014-03-22 LAB — CBC WITH DIFFERENTIAL/PLATELET
BASO%: 0.3 % (ref 0.0–2.0)
Basophils Absolute: 0 10*3/uL (ref 0.0–0.1)
EOS ABS: 0.1 10*3/uL (ref 0.0–0.5)
EOS%: 0.8 % (ref 0.0–7.0)
HCT: 38.5 % (ref 34.8–46.6)
HEMOGLOBIN: 12.3 g/dL (ref 11.6–15.9)
LYMPH%: 45.3 % (ref 14.0–49.7)
MCH: 27 pg (ref 25.1–34.0)
MCHC: 31.9 g/dL (ref 31.5–36.0)
MCV: 84.4 fL (ref 79.5–101.0)
MONO#: 0.4 10*3/uL (ref 0.1–0.9)
MONO%: 6.1 % (ref 0.0–14.0)
NEUT#: 2.8 10*3/uL (ref 1.5–6.5)
NEUT%: 47.5 % (ref 38.4–76.8)
PLATELETS: 305 10*3/uL (ref 145–400)
RBC: 4.56 10*6/uL (ref 3.70–5.45)
RDW: 15.5 % — ABNORMAL HIGH (ref 11.2–14.5)
WBC: 5.9 10*3/uL (ref 3.9–10.3)
lymph#: 2.7 10*3/uL (ref 0.9–3.3)

## 2014-03-22 LAB — COMPREHENSIVE METABOLIC PANEL (CC13)
ALBUMIN: 3.5 g/dL (ref 3.5–5.0)
ALK PHOS: 84 U/L (ref 40–150)
ALT: 21 U/L (ref 0–55)
AST: 19 U/L (ref 5–34)
Anion Gap: 9 mEq/L (ref 3–11)
BUN: 15.3 mg/dL (ref 7.0–26.0)
CALCIUM: 9 mg/dL (ref 8.4–10.4)
CO2: 24 mEq/L (ref 22–29)
CREATININE: 0.8 mg/dL (ref 0.6–1.1)
Chloride: 107 mEq/L (ref 98–109)
Glucose: 88 mg/dl (ref 70–140)
POTASSIUM: 4.3 meq/L (ref 3.5–5.1)
Sodium: 139 mEq/L (ref 136–145)
TOTAL PROTEIN: 7.4 g/dL (ref 6.4–8.3)
Total Bilirubin: <0.2 mg/dL (ref 0.20–1.20)

## 2014-03-22 MED ORDER — DIPHENHYDRAMINE HCL 25 MG PO CAPS
25.0000 mg | ORAL_CAPSULE | Freq: Once | ORAL | Status: AC
  Administered 2014-03-22: 25 mg via ORAL

## 2014-03-22 MED ORDER — HEPARIN SOD (PORK) LOCK FLUSH 100 UNIT/ML IV SOLN
500.0000 [IU] | Freq: Once | INTRAVENOUS | Status: AC
  Administered 2014-03-22: 500 [IU] via INTRAVENOUS
  Filled 2014-03-22: qty 5

## 2014-03-22 MED ORDER — ACETAMINOPHEN 325 MG PO TABS
650.0000 mg | ORAL_TABLET | Freq: Once | ORAL | Status: AC
  Administered 2014-03-22: 650 mg via ORAL

## 2014-03-22 MED ORDER — ACETAMINOPHEN 325 MG PO TABS
ORAL_TABLET | ORAL | Status: AC
  Filled 2014-03-22: qty 2

## 2014-03-22 MED ORDER — TRASTUZUMAB CHEMO INJECTION 440 MG
6.0000 mg/kg | Freq: Once | INTRAVENOUS | Status: AC
  Administered 2014-03-22: 840 mg via INTRAVENOUS
  Filled 2014-03-22: qty 40

## 2014-03-22 MED ORDER — SODIUM CHLORIDE 0.9 % IJ SOLN
10.0000 mL | INTRAMUSCULAR | Status: DC | PRN
  Filled 2014-03-22: qty 10

## 2014-03-22 MED ORDER — SODIUM CHLORIDE 0.9 % IJ SOLN
10.0000 mL | INTRAMUSCULAR | Status: DC | PRN
  Administered 2014-03-22: 10 mL via INTRAVENOUS
  Filled 2014-03-22: qty 10

## 2014-03-22 MED ORDER — DIPHENHYDRAMINE HCL 25 MG PO CAPS
ORAL_CAPSULE | ORAL | Status: AC
  Filled 2014-03-22: qty 1

## 2014-03-22 MED ORDER — SODIUM CHLORIDE 0.9 % IJ SOLN
10.0000 mL | INTRAMUSCULAR | Status: DC | PRN
Start: 2014-03-22 — End: 2014-03-22
  Filled 2014-03-22: qty 10

## 2014-03-22 MED ORDER — SODIUM CHLORIDE 0.9 % IV SOLN
Freq: Once | INTRAVENOUS | Status: AC
  Administered 2014-03-22: 13:00:00 via INTRAVENOUS

## 2014-03-22 MED ORDER — HEPARIN SOD (PORK) LOCK FLUSH 100 UNIT/ML IV SOLN
500.0000 [IU] | Freq: Once | INTRAVENOUS | Status: DC | PRN
  Filled 2014-03-22: qty 5

## 2014-03-22 NOTE — Patient Instructions (Signed)
[facility] Discharge Instructions for Patients Receiving Chemotherapy  Today you received the following chemotherapy agents Herceptin.  To help prevent nausea and vomiting after your treatment, we encourage you to take your nausea medication as prescribed.   If you develop nausea and vomiting that is not controlled by your nausea medication, call the clinic.   BELOW ARE SYMPTOMS THAT SHOULD BE REPORTED IMMEDIATELY:  *FEVER GREATER THAN 100.5 F  *CHILLS WITH OR WITHOUT FEVER  NAUSEA AND VOMITING THAT IS NOT CONTROLLED WITH YOUR NAUSEA MEDICATION  *UNUSUAL SHORTNESS OF BREATH  *UNUSUAL BRUISING OR BLEEDING  TENDERNESS IN MOUTH AND THROAT WITH OR WITHOUT PRESENCE OF ULCERS  *URINARY PROBLEMS  *BOWEL PROBLEMS  UNUSUAL RASH Items with * indicate a potential emergency and should be followed up as soon as possible.  Feel free to call the clinic you have any questions or concerns. The clinic phone number is [phone].    

## 2014-03-22 NOTE — Patient Instructions (Signed)

## 2014-03-23 ENCOUNTER — Ambulatory Visit
Admission: RE | Admit: 2014-03-23 | Discharge: 2014-03-23 | Disposition: A | Discharge status: Home / Self Care | Payer: BLUE CROSS/BLUE SHIELD | Source: Ambulatory Visit | Attending: Radiation Oncology | Admitting: Radiation Oncology

## 2014-03-23 ENCOUNTER — Ambulatory Visit: Payer: BLUE CROSS/BLUE SHIELD

## 2014-03-23 DIAGNOSIS — C50411 Malignant neoplasm of upper-outer quadrant of right female breast: Secondary | ICD-10-CM | POA: Diagnosis not present

## 2014-03-26 ENCOUNTER — Encounter: Payer: Self-pay | Admitting: Radiation Oncology

## 2014-03-26 ENCOUNTER — Ambulatory Visit
Admission: RE | Admit: 2014-03-26 | Discharge: 2014-03-26 | Disposition: A | Discharge status: Home / Self Care | Payer: BLUE CROSS/BLUE SHIELD | Source: Ambulatory Visit | Attending: Radiation Oncology | Admitting: Radiation Oncology

## 2014-03-26 ENCOUNTER — Ambulatory Visit: Payer: BLUE CROSS/BLUE SHIELD

## 2014-03-26 DIAGNOSIS — C50411 Malignant neoplasm of upper-outer quadrant of right female breast: Secondary | ICD-10-CM | POA: Diagnosis not present

## 2014-03-27 ENCOUNTER — Encounter: Payer: Self-pay | Admitting: Radiation Oncology

## 2014-03-27 ENCOUNTER — Ambulatory Visit
Admission: RE | Admit: 2014-03-27 | Discharge: 2014-03-27 | Disposition: A | Discharge status: Home / Self Care | Payer: BLUE CROSS/BLUE SHIELD | Source: Ambulatory Visit | Attending: Radiation Oncology | Admitting: Radiation Oncology

## 2014-03-27 ENCOUNTER — Ambulatory Visit: Payer: BLUE CROSS/BLUE SHIELD

## 2014-03-27 VITALS — BP 134/87 | HR 93 | Temp 98.4°F | Resp 16 | Wt 316.6 lb

## 2014-03-27 DIAGNOSIS — C50411 Malignant neoplasm of upper-outer quadrant of right female breast: Secondary | ICD-10-CM | POA: Diagnosis not present

## 2014-03-27 NOTE — Progress Notes (Signed)
Weekly rad txs  Right breast 7/33 completed, no skin changes seen, skin intact,  Using radiaplex gel,, appetite good, no pain, no c/o except nail beds blackened from chemothrapy 12:06 PM

## 2014-03-27 NOTE — Progress Notes (Signed)
Weekly Management Note:  Site: Right breast Current Dose:  1260  cGy Projected Dose: 4500  CGy followed by a boost of 1600 in 8 sessions  Narrative: The patient is seen today for routine under treatment assessment. CBCT/MVCT images/port films were reviewed. The chart was reviewed.   She is without complaints today.  She uses Radioplex gel.  Physical Examination:  Filed Vitals:   03/27/14 1206  BP: 134/87  Pulse: 93  Temp: 98.4 F (36.9 C)  Resp: 16  .  Weight: 316 lb 9.6 oz (143.609 kg).  There are no significant skin changes.  Impression: Tolerating radiation therapy well.  Plan: Continue radiation therapy as planned.

## 2014-03-28 ENCOUNTER — Ambulatory Visit: Payer: BLUE CROSS/BLUE SHIELD

## 2014-03-28 ENCOUNTER — Ambulatory Visit
Admission: RE | Admit: 2014-03-28 | Discharge: 2014-03-28 | Disposition: A | Discharge status: Home / Self Care | Payer: BLUE CROSS/BLUE SHIELD | Source: Ambulatory Visit | Attending: Radiation Oncology | Admitting: Radiation Oncology

## 2014-03-28 DIAGNOSIS — C50411 Malignant neoplasm of upper-outer quadrant of right female breast: Secondary | ICD-10-CM | POA: Diagnosis not present

## 2014-03-29 ENCOUNTER — Ambulatory Visit
Admission: RE | Admit: 2014-03-29 | Discharge: 2014-03-29 | Disposition: A | Discharge status: Home / Self Care | Payer: BLUE CROSS/BLUE SHIELD | Source: Ambulatory Visit | Attending: Radiation Oncology | Admitting: Radiation Oncology

## 2014-03-29 ENCOUNTER — Ambulatory Visit: Payer: BLUE CROSS/BLUE SHIELD

## 2014-03-29 DIAGNOSIS — C50411 Malignant neoplasm of upper-outer quadrant of right female breast: Secondary | ICD-10-CM | POA: Diagnosis not present

## 2014-03-30 ENCOUNTER — Ambulatory Visit: Payer: BLUE CROSS/BLUE SHIELD

## 2014-03-30 ENCOUNTER — Ambulatory Visit
Admission: RE | Admit: 2014-03-30 | Discharge: 2014-03-30 | Disposition: A | Discharge status: Home / Self Care | Payer: BLUE CROSS/BLUE SHIELD | Source: Ambulatory Visit | Attending: Radiation Oncology | Admitting: Radiation Oncology

## 2014-03-30 DIAGNOSIS — C50411 Malignant neoplasm of upper-outer quadrant of right female breast: Secondary | ICD-10-CM | POA: Diagnosis not present

## 2014-04-02 ENCOUNTER — Ambulatory Visit
Admission: RE | Admit: 2014-04-02 | Discharge: 2014-04-02 | Disposition: A | Discharge status: Home / Self Care | Payer: BLUE CROSS/BLUE SHIELD | Source: Ambulatory Visit | Attending: Radiation Oncology | Admitting: Radiation Oncology

## 2014-04-02 ENCOUNTER — Ambulatory Visit: Payer: BLUE CROSS/BLUE SHIELD

## 2014-04-02 ENCOUNTER — Encounter: Payer: Self-pay | Admitting: Radiation Oncology

## 2014-04-02 VITALS — BP 123/89 | HR 92 | Temp 97.6°F | Resp 20 | Ht 65.5 in | Wt 313.8 lb

## 2014-04-02 DIAGNOSIS — C50411 Malignant neoplasm of upper-outer quadrant of right female breast: Secondary | ICD-10-CM

## 2014-04-02 NOTE — Progress Notes (Addendum)
[name] has completed 11 fractions to her right breast.  She denies pain and fatigue.  The skin on her right breast has hyperpigmentation.  She is using radiaplex gel.  She is worried that the nail on her right big toe is loose.  It does not appear to be infected and is not draining.  BP 123/89 mmHg  Pulse 92  Temp(Src) 97.6 F (36.4 C) (Oral)  Resp 20  Ht 5' 5.5" (1.664 m)  Wt 313 lb 12.8 oz (142.339 kg)  BMI 51.41 kg/m2

## 2014-04-02 NOTE — Progress Notes (Signed)
Weekly Management Note:  Site: Right breast Current Dose:  1980  cGy Projected Dose: 4500  cGy followed by right breast boost  Narrative: The patient is seen today for routine under treatment assessment. CBCT/MVCT images/port films were reviewed. The chart was reviewed.   She is still doing well and is without complaints today except for her right great "toenail ready to come loose".  She uses Radioplex gel.  Physical Examination:  Filed Vitals:   04/02/14 1203  BP: 123/89  Pulse: 92  Temp: 97.6 F (36.4 C)  Resp: 20  .  Weight: 313 lb 12.8 oz (142.339 kg).  There is mild to moderate hyperpigmentation the skin along the right breast particularly along the lower axilla and inframammary area region as expected.  No areas of dry desquamation.  Impression: Tolerating radiation therapy well.  Plan: Continue radiation therapy as planned.

## 2014-04-03 ENCOUNTER — Ambulatory Visit: Payer: BLUE CROSS/BLUE SHIELD

## 2014-04-03 ENCOUNTER — Ambulatory Visit
Admission: RE | Admit: 2014-04-03 | Discharge: 2014-04-03 | Disposition: A | Discharge status: Home / Self Care | Payer: BLUE CROSS/BLUE SHIELD | Source: Ambulatory Visit | Attending: Radiation Oncology | Admitting: Radiation Oncology

## 2014-04-03 DIAGNOSIS — C50411 Malignant neoplasm of upper-outer quadrant of right female breast: Secondary | ICD-10-CM | POA: Diagnosis not present

## 2014-04-04 ENCOUNTER — Ambulatory Visit
Admission: RE | Admit: 2014-04-04 | Discharge: 2014-04-04 | Disposition: A | Discharge status: Home / Self Care | Payer: BLUE CROSS/BLUE SHIELD | Source: Ambulatory Visit | Attending: Radiation Oncology | Admitting: Radiation Oncology

## 2014-04-04 ENCOUNTER — Ambulatory Visit: Payer: BLUE CROSS/BLUE SHIELD

## 2014-04-04 DIAGNOSIS — C50411 Malignant neoplasm of upper-outer quadrant of right female breast: Secondary | ICD-10-CM | POA: Diagnosis not present

## 2014-04-05 ENCOUNTER — Ambulatory Visit: Payer: BLUE CROSS/BLUE SHIELD

## 2014-04-05 ENCOUNTER — Ambulatory Visit
Admission: RE | Admit: 2014-04-05 | Discharge: 2014-04-05 | Disposition: A | Discharge status: Home / Self Care | Payer: BLUE CROSS/BLUE SHIELD | Source: Ambulatory Visit | Attending: Radiation Oncology | Admitting: Radiation Oncology

## 2014-04-05 DIAGNOSIS — C50411 Malignant neoplasm of upper-outer quadrant of right female breast: Secondary | ICD-10-CM | POA: Diagnosis not present

## 2014-04-06 ENCOUNTER — Ambulatory Visit
Admission: RE | Admit: 2014-04-06 | Discharge: 2014-04-06 | Disposition: A | Discharge status: Home / Self Care | Payer: BLUE CROSS/BLUE SHIELD | Source: Ambulatory Visit | Attending: Radiation Oncology | Admitting: Radiation Oncology

## 2014-04-06 ENCOUNTER — Ambulatory Visit: Payer: BLUE CROSS/BLUE SHIELD

## 2014-04-06 DIAGNOSIS — C50411 Malignant neoplasm of upper-outer quadrant of right female breast: Secondary | ICD-10-CM | POA: Diagnosis not present

## 2014-04-09 ENCOUNTER — Ambulatory Visit
Admission: RE | Admit: 2014-04-09 | Discharge: 2014-04-09 | Disposition: A | Discharge status: Home / Self Care | Payer: BLUE CROSS/BLUE SHIELD | Source: Ambulatory Visit | Attending: Radiation Oncology | Admitting: Radiation Oncology

## 2014-04-09 ENCOUNTER — Ambulatory Visit: Payer: BLUE CROSS/BLUE SHIELD

## 2014-04-09 VITALS — BP 129/85 | HR 78 | Temp 98.2°F | Wt 315.7 lb

## 2014-04-09 DIAGNOSIS — L819 Disorder of pigmentation, unspecified: Secondary | ICD-10-CM | POA: Diagnosis not present

## 2014-04-09 DIAGNOSIS — C50411 Malignant neoplasm of upper-outer quadrant of right female breast: Secondary | ICD-10-CM

## 2014-04-09 DIAGNOSIS — Z510 Encounter for antineoplastic radiation therapy: Secondary | ICD-10-CM | POA: Insufficient documentation

## 2014-04-09 DIAGNOSIS — L599 Disorder of the skin and subcutaneous tissue related to radiation, unspecified: Secondary | ICD-10-CM | POA: Insufficient documentation

## 2014-04-09 MED ORDER — RADIAPLEXRX EX GEL
Freq: Once | CUTANEOUS | Status: AC
  Administered 2014-04-09: 13:00:00 via TOPICAL

## 2014-04-09 NOTE — Progress Notes (Signed)
Weekly Management Note:  Site: Right breast Current Dose:  2880  cGy Projected Dose: 4500  cGy followed by right breast boost  Narrative: The patient is seen today for routine under treatment assessment. CBCT/MVCT images/port films were reviewed. The chart was reviewed.   She is still doing well .  Denies fatigue. Physical Examination:  Filed Vitals:   04/09/14 1214  BP: 129/85  Pulse: 78  Temp: 98.2 F (36.8 C)  .  Weight: 315 lb 11.2 oz (143.201 kg).  There is mild to moderate hyperpigmentation the skin along the right breast and axilla and inframammary area region as expected. Skin slightly dry  Impression: Tolerating radiation therapy well.  Plan: Continue radiation therapy as planned.Given another tube of radiaplex to apply twice daily. -----------------------------------  Eppie Gibson, MD

## 2014-04-09 NOTE — Progress Notes (Signed)
Weekly assessment of radiation to right [email or web address].ch 16 of 33 treatments.Skin is hyperpigmented with no peeling.tenderness at times.Denies fatigue.Given another tube of radiaplex to apply twice daily.

## 2014-04-10 ENCOUNTER — Ambulatory Visit: Admission: RE | Admit: 2014-04-10 | Payer: BLUE CROSS/BLUE SHIELD | Source: Ambulatory Visit

## 2014-04-10 ENCOUNTER — Ambulatory Visit
Admission: RE | Admit: 2014-04-10 | Discharge: 2014-04-10 | Disposition: A | Discharge status: Home / Self Care | Payer: BLUE CROSS/BLUE SHIELD | Source: Ambulatory Visit | Attending: Radiation Oncology | Admitting: Radiation Oncology

## 2014-04-10 DIAGNOSIS — C50411 Malignant neoplasm of upper-outer quadrant of right female breast: Secondary | ICD-10-CM | POA: Diagnosis not present

## 2014-04-11 ENCOUNTER — Ambulatory Visit
Admission: RE | Admit: 2014-04-11 | Discharge: 2014-04-11 | Disposition: A | Discharge status: Home / Self Care | Payer: BLUE CROSS/BLUE SHIELD | Source: Ambulatory Visit | Attending: Radiation Oncology | Admitting: Radiation Oncology

## 2014-04-11 ENCOUNTER — Ambulatory Visit: Payer: BLUE CROSS/BLUE SHIELD

## 2014-04-11 DIAGNOSIS — C50411 Malignant neoplasm of upper-outer quadrant of right female breast: Secondary | ICD-10-CM | POA: Diagnosis not present

## 2014-04-12 ENCOUNTER — Ambulatory Visit
Admission: RE | Admit: 2014-04-12 | Discharge: 2014-04-12 | Disposition: A | Discharge status: Home / Self Care | Payer: BLUE CROSS/BLUE SHIELD | Source: Ambulatory Visit | Attending: Radiation Oncology | Admitting: Radiation Oncology

## 2014-04-12 ENCOUNTER — Other Ambulatory Visit (HOSPITAL_BASED_OUTPATIENT_CLINIC_OR_DEPARTMENT_OTHER): Payer: BLUE CROSS/BLUE SHIELD

## 2014-04-12 ENCOUNTER — Ambulatory Visit: Payer: BLUE CROSS/BLUE SHIELD

## 2014-04-12 ENCOUNTER — Ambulatory Visit (HOSPITAL_BASED_OUTPATIENT_CLINIC_OR_DEPARTMENT_OTHER): Payer: BLUE CROSS/BLUE SHIELD

## 2014-04-12 DIAGNOSIS — Z95828 Presence of other vascular implants and grafts: Secondary | ICD-10-CM

## 2014-04-12 DIAGNOSIS — C50411 Malignant neoplasm of upper-outer quadrant of right female breast: Secondary | ICD-10-CM | POA: Diagnosis not present

## 2014-04-12 DIAGNOSIS — Z5112 Encounter for antineoplastic immunotherapy: Secondary | ICD-10-CM | POA: Diagnosis not present

## 2014-04-12 LAB — COMPREHENSIVE METABOLIC PANEL (CC13)
ALT: 20 U/L (ref 0–55)
ANION GAP: 11 meq/L (ref 3–11)
AST: 17 U/L (ref 5–34)
Albumin: 3.3 g/dL — ABNORMAL LOW (ref 3.5–5.0)
Alkaline Phosphatase: 91 U/L (ref 40–150)
BUN: 15.1 mg/dL (ref 7.0–26.0)
CHLORIDE: 109 meq/L (ref 98–109)
CO2: 22 meq/L (ref 22–29)
Calcium: 9 mg/dL (ref 8.4–10.4)
Creatinine: 0.7 mg/dL (ref 0.6–1.1)
EGFR: >90 mL/min/{1.73_m2} (ref >90)
Glucose: 95 mg/dl (ref 70–140)
Potassium: 4.2 mEq/L (ref 3.5–5.1)
SODIUM: 142 meq/L (ref 136–145)
TOTAL PROTEIN: 7 g/dL (ref 6.4–8.3)
Total Bilirubin: 0.24 mg/dL (ref 0.20–1.20)

## 2014-04-12 LAB — CBC WITH DIFFERENTIAL/PLATELET
BASO%: 0.6 % (ref 0.0–2.0)
Basophils Absolute: 0 10*3/uL (ref 0.0–0.1)
EOS%: 0.6 % (ref 0.0–7.0)
Eosinophils Absolute: 0 10*3/uL (ref 0.0–0.5)
HEMATOCRIT: 37.2 % (ref 34.8–46.6)
HEMOGLOBIN: 11.6 g/dL (ref 11.6–15.9)
LYMPH#: 1.4 10*3/uL (ref 0.9–3.3)
LYMPH%: 25.8 % (ref 14.0–49.7)
MCH: 25.4 pg (ref 25.1–34.0)
MCHC: 31.1 g/dL — ABNORMAL LOW (ref 31.5–36.0)
MCV: 81.6 fL (ref 79.5–101.0)
MONO#: 0.5 10*3/uL (ref 0.1–0.9)
MONO%: 8.2 % (ref 0.0–14.0)
NEUT%: 64.8 % (ref 38.4–76.8)
NEUTROS ABS: 3.6 10*3/uL (ref 1.5–6.5)
PLATELETS: 281 10*3/uL (ref 145–400)
RBC: 4.56 10*6/uL (ref 3.70–5.45)
RDW: 16.2 % — ABNORMAL HIGH (ref 11.2–14.5)
WBC: 5.6 10*3/uL (ref 3.9–10.3)

## 2014-04-12 LAB — FOLLICLE STIMULATING HORMONE: FSH: 29.6 m[IU]/mL

## 2014-04-12 MED ORDER — SODIUM CHLORIDE 0.9 % IJ SOLN
10.0000 mL | INTRAMUSCULAR | Status: DC | PRN
  Filled 2014-04-12: qty 10

## 2014-04-12 MED ORDER — ACETAMINOPHEN 325 MG PO TABS: 650.0000 mg | ORAL_TABLET | Freq: Once | ORAL | Status: DC

## 2014-04-12 MED ORDER — ACETAMINOPHEN 160 MG/5ML PO SOLN
650.0000 mg | Freq: Once | ORAL | Status: AC
  Administered 2014-04-12: 650 mg via ORAL
  Filled 2014-04-12: qty 20.3

## 2014-04-12 MED ORDER — DIPHENHYDRAMINE HCL 25 MG PO CAPS
ORAL_CAPSULE | ORAL | Status: AC
  Filled 2014-04-12: qty 1

## 2014-04-12 MED ORDER — SODIUM CHLORIDE 0.9 % IJ SOLN
10.0000 mL | INTRAMUSCULAR | Status: DC | PRN
  Administered 2014-04-12 (×2): 10 mL via INTRAVENOUS
  Filled 2014-04-12: qty 10

## 2014-04-12 MED ORDER — TRASTUZUMAB CHEMO INJECTION 440 MG
6.0000 mg/kg | Freq: Once | INTRAVENOUS | Status: AC
  Administered 2014-04-12: 840 mg via INTRAVENOUS
  Filled 2014-04-12: qty 40

## 2014-04-12 MED ORDER — SODIUM CHLORIDE 0.9 % IV SOLN
Freq: Once | INTRAVENOUS | Status: AC
  Administered 2014-04-12: 13:00:00 via INTRAVENOUS

## 2014-04-12 MED ORDER — HEPARIN SOD (PORK) LOCK FLUSH 100 UNIT/ML IV SOLN
500.0000 [IU] | Freq: Once | INTRAVENOUS | Status: AC
  Administered 2014-04-12: 500 [IU] via INTRAVENOUS
  Filled 2014-04-12: qty 5

## 2014-04-12 MED ORDER — DIPHENHYDRAMINE HCL 25 MG PO CAPS
25.0000 mg | ORAL_CAPSULE | Freq: Once | ORAL | Status: AC
  Administered 2014-04-12: 25 mg via ORAL

## 2014-04-12 NOTE — Patient Instructions (Signed)

## 2014-04-12 NOTE — Patient Instructions (Signed)
[facility] Discharge Instructions for Patients Receiving Chemotherapy  Today you received the following chemotherapy agents:  Herceptin  To help prevent nausea and vomiting after your treatment, we encourage you to take your nausea medication as prescribed.   If you develop nausea and vomiting that is not controlled by your nausea medication, call the clinic.   BELOW ARE SYMPTOMS THAT SHOULD BE REPORTED IMMEDIATELY:  *FEVER GREATER THAN 100.5 F  *CHILLS WITH OR WITHOUT FEVER  NAUSEA AND VOMITING THAT IS NOT CONTROLLED WITH YOUR NAUSEA MEDICATION  *UNUSUAL SHORTNESS OF BREATH  *UNUSUAL BRUISING OR BLEEDING  TENDERNESS IN MOUTH AND THROAT WITH OR WITHOUT PRESENCE OF ULCERS  *URINARY PROBLEMS  *BOWEL PROBLEMS  UNUSUAL RASH Items with * indicate a potential emergency and should be followed up as soon as possible.  Feel free to call the clinic you have any questions or concerns. The clinic phone number is [phone].  Please show the CHEMO ALERT CARD at check-in to the Emergency Department and triage nurse.   

## 2014-04-12 NOTE — Addendum Note (Signed)
Addended by: Lucile Crater on: 04/12/2014 02:01 PM   Modules accepted: Orders, SmartSet

## 2014-04-13 ENCOUNTER — Ambulatory Visit: Payer: BLUE CROSS/BLUE SHIELD

## 2014-04-13 ENCOUNTER — Ambulatory Visit
Admission: RE | Admit: 2014-04-13 | Discharge: 2014-04-13 | Disposition: A | Discharge status: Home / Self Care | Payer: BLUE CROSS/BLUE SHIELD | Source: Ambulatory Visit | Attending: Radiation Oncology | Admitting: Radiation Oncology

## 2014-04-16 ENCOUNTER — Encounter: Payer: Self-pay | Admitting: Radiation Oncology

## 2014-04-16 ENCOUNTER — Ambulatory Visit
Admission: RE | Admit: 2014-04-16 | Discharge: 2014-04-16 | Disposition: A | Discharge status: Home / Self Care | Payer: BLUE CROSS/BLUE SHIELD | Source: Ambulatory Visit | Attending: Radiation Oncology | Admitting: Radiation Oncology

## 2014-04-16 ENCOUNTER — Ambulatory Visit: Payer: BLUE CROSS/BLUE SHIELD

## 2014-04-16 VITALS — BP 127/80 | HR 80 | Temp 98.2°F | Ht 65.5 in | Wt 317.4 lb

## 2014-04-16 DIAGNOSIS — C50411 Malignant neoplasm of upper-outer quadrant of right female breast: Secondary | ICD-10-CM | POA: Diagnosis not present

## 2014-04-16 NOTE — Progress Notes (Signed)
Weekly Management Note:  Site: Right breast Current Dose:  3600  cGy Projected Dose: 4500   cGy followed by 8 fraction boost  Narrative: The patient is seen today for routine under treatment assessment. CBCT/MVCT images/port films were reviewed. The chart was reviewed.   She is without complaints today.  She uses Radioplex gel.  Physical Examination:  Filed Vitals:   04/16/14 1209  BP: 127/80  Pulse: 80  Temp: 98.2 F (36.8 C)  .  Weight: 317 lb 6.4 oz (143.972 kg).  There is mild to moderate hyperpigmentation the skin with patchy dry desquamation along the axilla and inframammary region.  Impression: Tolerating radiation therapy well.  Plan: Continue radiation therapy as planned.

## 2014-04-16 NOTE — Progress Notes (Addendum)
Ms. Amoroso reports today that she shaved there right axilla with a straight razor, nicked her skin and now has a small open area.  Hyperpigmentation noted of right, lower axillary region, breast and inframmary fold.  Using Radiaplex Gel BID.  Denies any fatigue.

## 2014-04-16 NOTE — Progress Notes (Signed)
Virtual simulation note: The patient was set up with a 4 field technique for her right breast boost.  Four unique and separate multileaf collimators designed to conform the field.  I prescribing 1600 cGy in 8 sessions utilizing mixed 10 MV/6 MV photons.  An isodose plan is reviewed and excepted.

## 2014-04-17 ENCOUNTER — Ambulatory Visit: Payer: BLUE CROSS/BLUE SHIELD

## 2014-04-17 ENCOUNTER — Ambulatory Visit
Admission: RE | Admit: 2014-04-17 | Discharge: 2014-04-17 | Disposition: A | Discharge status: Home / Self Care | Payer: BLUE CROSS/BLUE SHIELD | Source: Ambulatory Visit | Attending: Radiation Oncology | Admitting: Radiation Oncology

## 2014-04-17 DIAGNOSIS — C50411 Malignant neoplasm of upper-outer quadrant of right female breast: Secondary | ICD-10-CM | POA: Diagnosis not present

## 2014-04-18 ENCOUNTER — Ambulatory Visit
Admission: RE | Admit: 2014-04-18 | Discharge: 2014-04-18 | Disposition: A | Discharge status: Home / Self Care | Payer: BLUE CROSS/BLUE SHIELD | Source: Ambulatory Visit | Attending: Radiation Oncology | Admitting: Radiation Oncology

## 2014-04-18 ENCOUNTER — Ambulatory Visit: Payer: BLUE CROSS/BLUE SHIELD

## 2014-04-18 DIAGNOSIS — C50411 Malignant neoplasm of upper-outer quadrant of right female breast: Secondary | ICD-10-CM | POA: Diagnosis not present

## 2014-04-18 LAB — ESTRADIOL, ULTRA SENS: Estradiol, Ultra Sensitive: 39 pg/mL

## 2014-04-19 ENCOUNTER — Ambulatory Visit
Admission: RE | Admit: 2014-04-19 | Discharge: 2014-04-19 | Disposition: A | Discharge status: Home / Self Care | Payer: BLUE CROSS/BLUE SHIELD | Source: Ambulatory Visit | Attending: Radiation Oncology | Admitting: Radiation Oncology

## 2014-04-19 ENCOUNTER — Ambulatory Visit: Payer: BLUE CROSS/BLUE SHIELD

## 2014-04-19 ENCOUNTER — Other Ambulatory Visit: Payer: Self-pay | Admitting: *Deleted

## 2014-04-19 DIAGNOSIS — C50411 Malignant neoplasm of upper-outer quadrant of right female breast: Secondary | ICD-10-CM | POA: Diagnosis not present

## 2014-04-19 MED ORDER — LIDOCAINE-PRILOCAINE 2.5-2.5 % EX CREA: 1.0000 "application " | TOPICAL_CREAM | CUTANEOUS | Status: DC | PRN

## 2014-04-20 ENCOUNTER — Ambulatory Visit
Admission: RE | Admit: 2014-04-20 | Discharge: 2014-04-20 | Disposition: A | Discharge status: Home / Self Care | Payer: BLUE CROSS/BLUE SHIELD | Source: Ambulatory Visit | Attending: Radiation Oncology | Admitting: Radiation Oncology

## 2014-04-20 ENCOUNTER — Telehealth: Payer: Self-pay | Admitting: *Deleted

## 2014-04-20 ENCOUNTER — Ambulatory Visit: Payer: BLUE CROSS/BLUE SHIELD

## 2014-04-20 DIAGNOSIS — C50411 Malignant neoplasm of upper-outer quadrant of right female breast: Secondary | ICD-10-CM | POA: Diagnosis not present

## 2014-04-20 NOTE — Telephone Encounter (Signed)
Pt. Calling re: flush appt. For 05/02/14. She is having labs and MD appt. That day and wondering why flush appt. Explained to pt. About accessing Port for labs.  Pt. Having Herceptin the following day (05/03/14)  Pt. Does not want port accessed 2 days in a row. She prefers peripheral stick for labs.  Appt. For Flush cancelled per pt. request. Pt. voiced appreciation.

## 2014-04-23 ENCOUNTER — Ambulatory Visit: Payer: BLUE CROSS/BLUE SHIELD

## 2014-04-23 ENCOUNTER — Ambulatory Visit
Admission: RE | Admit: 2014-04-23 | Discharge: 2014-04-23 | Disposition: A | Discharge status: Home / Self Care | Payer: BLUE CROSS/BLUE SHIELD | Source: Ambulatory Visit | Attending: Radiation Oncology | Admitting: Radiation Oncology

## 2014-04-23 VITALS — BP 137/86 | HR 78 | Temp 98.1°F | Wt 317.0 lb

## 2014-04-23 DIAGNOSIS — C50411 Malignant neoplasm of upper-outer quadrant of right female breast: Secondary | ICD-10-CM | POA: Diagnosis present

## 2014-04-23 MED ORDER — BIAFINE EX EMUL
CUTANEOUS | Status: DC | PRN
  Administered 2014-04-23: 13:00:00 via TOPICAL

## 2014-04-23 NOTE — Progress Notes (Signed)
Weekly assessment of radiation to right breast.Marked hyperpigmentation of axilla and mammary fold.Change  to biafine.Mild fatigue.

## 2014-04-23 NOTE — Addendum Note (Signed)
Encounter addended by: Norm Salt, RN on: 04/23/2014 12:41 PM<BR>     Documentation filed: Inpatient MAR

## 2014-04-23 NOTE — Progress Notes (Signed)
Weekly Management Note:  Site: Right breast Current Dose:  4500  cGy Projected Dose: 4500  cGy followed by right breast boost of 1600 cGy in 8 sessions  Narrative: The patient is seen today for routine under treatment assessment. CBCT/MVCT images/port films were reviewed. The chart was reviewed.   She has moderate discomfort along her right axilla and inframammary region as expected.  We are changing her from Radioplex gel to Biafine cream.  She also has mild to moderate fatigue.  Physical Examination:  Filed Vitals:   04/23/14 1205  BP: 137/86  Pulse: 78  Temp: 98.1 F (36.7 C)  .  Weight: 317 lb (143.79 kg).  There is marked hyperpigmentation the skin, particularly along the inframammary region and axilla.  She has dry desquamation along the inframammary region and axilla with no areas of moist desquamation.  Impression: Tolerating radiation therapy well.  She'll begin her breast boost tomorrow.  Plan: Continue radiation therapy as planned.

## 2014-04-24 ENCOUNTER — Ambulatory Visit: Payer: BLUE CROSS/BLUE SHIELD

## 2014-04-24 ENCOUNTER — Ambulatory Visit
Admission: RE | Admit: 2014-04-24 | Discharge: 2014-04-24 | Disposition: A | Discharge status: Home / Self Care | Payer: BLUE CROSS/BLUE SHIELD | Source: Ambulatory Visit | Attending: Radiation Oncology | Admitting: Radiation Oncology

## 2014-04-24 DIAGNOSIS — C50411 Malignant neoplasm of upper-outer quadrant of right female breast: Secondary | ICD-10-CM | POA: Diagnosis not present

## 2014-04-25 ENCOUNTER — Ambulatory Visit
Admission: RE | Admit: 2014-04-25 | Discharge: 2014-04-25 | Disposition: A | Discharge status: Home / Self Care | Payer: BLUE CROSS/BLUE SHIELD | Source: Ambulatory Visit | Attending: Radiation Oncology | Admitting: Radiation Oncology

## 2014-04-25 ENCOUNTER — Ambulatory Visit: Payer: BLUE CROSS/BLUE SHIELD

## 2014-04-25 DIAGNOSIS — C50411 Malignant neoplasm of upper-outer quadrant of right female breast: Secondary | ICD-10-CM | POA: Diagnosis not present

## 2014-04-26 ENCOUNTER — Ambulatory Visit
Admission: RE | Admit: 2014-04-26 | Discharge: 2014-04-26 | Disposition: A | Discharge status: Home / Self Care | Payer: BLUE CROSS/BLUE SHIELD | Source: Ambulatory Visit | Attending: Radiation Oncology | Admitting: Radiation Oncology

## 2014-04-26 ENCOUNTER — Ambulatory Visit: Payer: BLUE CROSS/BLUE SHIELD

## 2014-04-26 DIAGNOSIS — C50411 Malignant neoplasm of upper-outer quadrant of right female breast: Secondary | ICD-10-CM | POA: Diagnosis not present

## 2014-04-27 ENCOUNTER — Ambulatory Visit: Payer: BLUE CROSS/BLUE SHIELD

## 2014-04-27 ENCOUNTER — Ambulatory Visit
Admission: RE | Admit: 2014-04-27 | Discharge: 2014-04-27 | Disposition: A | Discharge status: Home / Self Care | Payer: BLUE CROSS/BLUE SHIELD | Source: Ambulatory Visit | Attending: Radiation Oncology | Admitting: Radiation Oncology

## 2014-04-27 DIAGNOSIS — C50411 Malignant neoplasm of upper-outer quadrant of right female breast: Secondary | ICD-10-CM | POA: Diagnosis not present

## 2014-04-30 ENCOUNTER — Encounter: Payer: Self-pay | Admitting: Radiation Oncology

## 2014-04-30 ENCOUNTER — Ambulatory Visit
Admission: RE | Admit: 2014-04-30 | Discharge: 2014-04-30 | Disposition: A | Discharge status: Home / Self Care | Payer: BLUE CROSS/BLUE SHIELD | Source: Ambulatory Visit | Attending: Radiation Oncology | Admitting: Radiation Oncology

## 2014-04-30 ENCOUNTER — Ambulatory Visit: Payer: BLUE CROSS/BLUE SHIELD

## 2014-04-30 VITALS — BP 134/92 | HR 92 | Resp 16 | Wt 317.9 lb

## 2014-04-30 DIAGNOSIS — C50411 Malignant neoplasm of upper-outer quadrant of right female breast: Secondary | ICD-10-CM | POA: Diagnosis present

## 2014-04-30 MED ORDER — BIAFINE EX EMUL
Freq: Every day | CUTANEOUS | Status: DC
  Administered 2014-04-30: 15:00:00 via TOPICAL

## 2014-04-30 NOTE — Progress Notes (Signed)
Weekly Management Note:  Site: Right breast Current Dose:  5500  cGy Projected Dose: 6100  cGy  Narrative: The patient is seen today for routine under treatment assessment. CBCT/MVCT images/port films were reviewed. The chart was reviewed.   She is without new complaints today.  She uses Radioplex gel.  Physical Examination:  Filed Vitals:   04/30/14 1506  BP: 134/92  Pulse: 92  Resp: 16  .  Weight: 317 lb 14.4 oz (144.198 kg).  There is marked hyperpigmentation the skin along the right breast with dry desquamation along the inframammary region and axilla.  No areas of moist desquamation.  Impression: Tolerating radiation therapy well.  She finishes her radiation therapy this Thursday.  Plan: Continue radiation therapy as planned.

## 2014-04-30 NOTE — Progress Notes (Signed)
Hyperpigmentation with dry desquamation under right breast. Also, desquamation noted under right axilla. Patient reports using Biafine bid as directed. Denies fatigue. Completes Wednesday. Given one month follow up appointment card.

## 2014-05-01 ENCOUNTER — Ambulatory Visit: Payer: BLUE CROSS/BLUE SHIELD

## 2014-05-01 ENCOUNTER — Ambulatory Visit
Admission: RE | Admit: 2014-05-01 | Discharge: 2014-05-01 | Disposition: A | Discharge status: Home / Self Care | Payer: BLUE CROSS/BLUE SHIELD | Source: Ambulatory Visit | Attending: Radiation Oncology | Admitting: Radiation Oncology

## 2014-05-01 DIAGNOSIS — C50411 Malignant neoplasm of upper-outer quadrant of right female breast: Secondary | ICD-10-CM | POA: Diagnosis not present

## 2014-05-02 ENCOUNTER — Other Ambulatory Visit (HOSPITAL_BASED_OUTPATIENT_CLINIC_OR_DEPARTMENT_OTHER): Payer: BLUE CROSS/BLUE SHIELD

## 2014-05-02 ENCOUNTER — Other Ambulatory Visit: Payer: BLUE CROSS/BLUE SHIELD

## 2014-05-02 ENCOUNTER — Ambulatory Visit
Admission: RE | Admit: 2014-05-02 | Discharge: 2014-05-02 | Disposition: A | Discharge status: Home / Self Care | Payer: BLUE CROSS/BLUE SHIELD | Source: Ambulatory Visit | Attending: Radiation Oncology | Admitting: Radiation Oncology

## 2014-05-02 ENCOUNTER — Ambulatory Visit: Payer: BLUE CROSS/BLUE SHIELD

## 2014-05-02 ENCOUNTER — Ambulatory Visit (HOSPITAL_BASED_OUTPATIENT_CLINIC_OR_DEPARTMENT_OTHER): Payer: BLUE CROSS/BLUE SHIELD | Admitting: Oncology

## 2014-05-02 VITALS — BP 126/67 | HR 74 | Temp 97.4°F | Resp 19 | Ht 65.5 in | Wt 317.4 lb

## 2014-05-02 DIAGNOSIS — Z170 Estrogen receptor positive status [ER+]: Secondary | ICD-10-CM

## 2014-05-02 DIAGNOSIS — C50411 Malignant neoplasm of upper-outer quadrant of right female breast: Secondary | ICD-10-CM

## 2014-05-02 LAB — COMPREHENSIVE METABOLIC PANEL (CC13)
ALT: 31 U/L (ref 0–55)
AST: 26 U/L (ref 5–34)
Albumin: 3.4 g/dL — ABNORMAL LOW (ref 3.5–5.0)
Alkaline Phosphatase: 100 U/L (ref 40–150)
Anion Gap: 13 mEq/L — ABNORMAL HIGH (ref 3–11)
BILIRUBIN TOTAL: 0.2 mg/dL (ref 0.20–1.20)
BUN: 14.1 mg/dL (ref 7.0–26.0)
CO2: 22 meq/L (ref 22–29)
CREATININE: 0.8 mg/dL (ref 0.6–1.1)
Calcium: 9.1 mg/dL (ref 8.4–10.4)
Chloride: 105 mEq/L (ref 98–109)
EGFR: >90 mL/min/{1.73_m2} (ref >90)
GLUCOSE: 102 mg/dL (ref 70–140)
Potassium: 4 mEq/L (ref 3.5–5.1)
Sodium: 140 mEq/L (ref 136–145)
Total Protein: 7.1 g/dL (ref 6.4–8.3)

## 2014-05-02 LAB — CBC WITH DIFFERENTIAL/PLATELET
BASO%: 0.6 % (ref 0.0–2.0)
Basophils Absolute: 0 10*3/uL (ref 0.0–0.1)
EOS%: 1.2 % (ref 0.0–7.0)
Eosinophils Absolute: 0.1 10*3/uL (ref 0.0–0.5)
HEMATOCRIT: 38.2 % (ref 34.8–46.6)
HGB: 11.9 g/dL (ref 11.6–15.9)
LYMPH%: 26.5 % (ref 14.0–49.7)
MCH: 25.4 pg (ref 25.1–34.0)
MCHC: 31.3 g/dL — ABNORMAL LOW (ref 31.5–36.0)
MCV: 81.1 fL (ref 79.5–101.0)
MONO#: 0.5 10*3/uL (ref 0.1–0.9)
MONO%: 8.1 % (ref 0.0–14.0)
NEUT#: 3.9 10*3/uL (ref 1.5–6.5)
NEUT%: 63.6 % (ref 38.4–76.8)
Platelets: 300 10*3/uL (ref 145–400)
RBC: 4.71 10*6/uL (ref 3.70–5.45)
RDW: 16.5 % — ABNORMAL HIGH (ref 11.2–14.5)
WBC: 6.1 10*3/uL (ref 3.9–10.3)
lymph#: 1.6 10*3/uL (ref 0.9–3.3)

## 2014-05-02 MED ORDER — TAMOXIFEN CITRATE 20 MG PO TABS: 20.0000 mg | ORAL_TABLET | Freq: Every day | ORAL | Status: DC

## 2014-05-02 MED ORDER — BIAFINE EX EMUL
CUTANEOUS | Status: DC | PRN
  Administered 2014-05-02: 18:00:00 via TOPICAL

## 2014-05-02 NOTE — Progress Notes (Signed)
[facility]  [phone]) [phone] [phone]) [phone]     ID: PATRESE NEAL DOB: [birth year]  MR#: [id number]  MGQ#:[id number]  Patient Care Team: Deland Pretty, MD as PCP - General (Internal Medicine) Erroll Luna, MD as Consulting Physician (General Surgery) Chauncey Cruel, MD as Consulting Physician (Oncology) Everlene Farrier, MD as Consulting Physician (Obstetrics and Gynecology)  CHIEF COMPLAINT:  triple positive breast cancer  CURRENT TREATMENT:  Trastuzumab, adjuvant radiation  BREAST CANCER HISTORY: From the original intake note:  The patient herself palpated a mass in her right breast mid July. She brought it to Dr. Sherlynn Stalls attention and he set the patient up for bilateral diagnostic mammography and right breast ultrasonography of the breast Center 07/28/2013. Right mammogram showed an irregular mass in the upper outer quadrant measuring 1.4 cm. There was some associated pleomorphic microcalcifications. This was palpable, at the 10:30 position 11 cm from the nipple. By palpation it measured approximately 1.5 cm. Ultrasound confirmed an hypoechoic mass with irregular borders measuring 1.1 cm. The right axilla showed a few adjacent deep lymph nodes with mild cortical thickening.  On 08/08/2013 the patient underwent biopsy of the breast mass in question. The pathology (SAA [id number]) showed an invasive ductal carcinoma, grade 2, estrogen receptor 45% positive, progesterone receptor 47% positive, both with moderate staining intensity, with an MIB-1 of 52%, and HER-2 amplification with a signals ratio of 8.1. The right axillary lymph node biopsied at the same time was benign this was felt to be possibly concordant.  And 08/15/2013 the patient underwent bilateral breast MRI showing a 2 cm irregular enhancing mass in the upper outer quadrant of the right breast. There were no additional changes no abnormal appearing lymph nodes and no masses or abnormalities in the left  breast.  The patient's subsequent history is as detailed below  INTERVAL HISTORY: Anacarolina returns today for follow up of her breast cancer accompanied by her mother. Since her last visit here she has been on radiation, which ends tomorrow. She generally has done very well although she has had hyper pigmentation and some dry desquamation. She has felt mildly fatigued. Her periods stopped with the beginning of chemotherapy and have not resumed. She is not currently exercising.  REVIEW OF SYSTEMS: Khadijah was having hot flashes but these are better. She also was having some vaginal dryness problems without also has improved. What that tells me is that her periods are likely to come back. She is likely making more estrogen. She still has a little bit of blurred vision. Her feet swell sometimes. This is bilateral. She thinks her toenails are "gross". She wonders if one or more of them will fall off. She has not really night sweats but does feel hot at night sometimes. She sleeps well below. She has a mole in her backside that she was been a look at. She feels forgetful. A detailed review of systems today was otherwise stable  PAST MEDICAL HISTORY: Past Medical History  Diagnosis Date  . Anxiety   . Wears contact lenses   . Snores   . Malignant neoplasm of breast (female), unspecified site     PAST SURGICAL HISTORY: Past Surgical History  Procedure Laterality Date  . Wisdom tooth extraction    . Portacath placement N/A 08/23/2013    Procedure: INSERTION PORT-A-CATH WITH ULTRA SOUND;  Surgeon: Joyice Faster. Cornett, MD;  Location: [facility];  Service: General;  Laterality: N/A;  . Breast lumpectomy with radioactive seed localization Right 01/25/2014  Procedure: RIGHT BREAST LUMPECTOMY WITH RADIOACTIVE SEED LOCALIZATION ;  Surgeon: Erroll Luna, MD;  Location: [facility];  Service: General;  Laterality: Right;  . Axillary sentinel node biopsy Right 01/25/2014     Procedure: RIGHT SENTINEL LYMPH NODE MAPPING;  Surgeon: Erroll Luna, MD;  Location: [facility];  Service: General;  Laterality: Right;    FAMILY HISTORY Family History  Problem Relation Age of Onset  . Uterine cancer Mother 64    TAH/BSO  . Colon cancer Father 39    deceased 25  . Leukemia Maternal Grandmother     deceased 68  . Skin cancer Paternal Grandmother   . Prostate cancer Other    the patient's father died from colon cancer the age of 79. It had been diagnosed 3 years prior. The patient's mother was diagnosed with cancer of the uterus at age 8. She is 45 years old as of August 2015. The patient had no brothers or sisters. There is a history of prostate cancer leukemia at in the family as well as melanoma, but there is no history of breast or ovarian cancer in the family.  GYNECOLOGIC HISTORY:  No LMP recorded. Patient is not currently having periods (Reason: Chemotherapy). Menarche age 44. The patient is GX P0. She used oral contraceptives for several years remotely with no complications. She stopped having periods after first cycle of chemotherapy.  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 She lives alone, with no pets.    ADVANCED DIRECTIVES: Not in place. At her 08/16/2013 visit the patient was given the upper. Documents 2 complete and notarize so she may name her mother, [name] focal as her healthcare power of attorney, which is the patient's intention. Ms. focal can be reached at [phone]. She currently lives in [place] but is planning to move to [place]: History  Substance Use Topics  . Smoking status: Former Smoker    Quit date: 02/20/2013  . Smokeless tobacco: Not on file  . Alcohol Use: Yes     Comment: 1/week     Colonoscopy: Never  PAP: July 2015 Bone density: Never  Lipid panel:  Allergies  Allergen Reactions  . Amoxicillin Rash  . Chocolate Rash    Only  Hershey's brand    Current Outpatient Prescriptions  Medication Sig Dispense Refill  . Calcium-Vitamin D-Vitamin K (CALCIUM SOFT CHEWS PO) Take by mouth daily.    Marland Kitchen emollient (BIAFINE) cream Apply topically as needed.    . hyaluronate sodium (RADIAPLEXRX) GEL Apply 1 application topically 2 (two) times daily. Apply after rad txs and bedtime daily to  breast    . hydrocortisone (ANUSOL-HC) 25 MG suppository Place 1 suppository (25 mg total) rectally 2 (two) times daily. (Patient taking differently: Place 25 mg rectally 2 (two) times daily. As Needed) 12 suppository 1  . lidocaine-prilocaine (EMLA) cream Apply 1 application topically as needed. 30 g 0  . loratadine (CLARITIN) 10 MG tablet Take 10 mg by mouth daily as needed for allergies.    . Multiple Vitamins-Minerals (MULTI ADULT GUMMIES PO) Take 2 each by mouth daily.    . non-metallic deodorant Jethro Poling) MISC Apply 1 application topically.     No current facility-administered medications for this visit.   Facility-Administered Medications Ordered in Other Visits  Medication Dose Route Frequency Provider Last Rate Last Dose  . sodium chloride 0.9 % injection 10 mL  10 mL Intravenous PRN  Chauncey Cruel, MD   10 mL at 11/30/13 1102  . sodium chloride 0.9 % injection 10 mL  10 mL Intravenous PRN Chauncey Cruel, MD        OBJECTIVE: Middle-aged [place] American woman who appears stated age 45 Vitals:   05/02/14 1614  BP: 126/67  Pulse: 74  Temp: 97.4 F (36.3 C)  Resp: 19     Body mass index is 52 kg/(m^2).     ECOG FS:1 - Symptomatic but completely ambulatory   Sclerae unicteric, EOMs intact Oropharynx clear, slightly dry No cervical or supraclavicular adenopathy Lungs no rales or rhonchi Heart regular rate and rhythm Abd soft, obese, nontender, positive bowel sounds MSK no focal spinal tenderness, no upper extremity lymphedema Neuro: nonfocal, well oriented, positive affect Breasts: The right breast is status post  lumpectomy and radiation. There is some skin thickening, and significant hyperpigmentation. There is some dry desquamation. There is no mass or other change suspicious for local recurrence. The right axilla is benign. Skin: She has a skin tag in her lower left buttock. She has significant toenail changes consistent with prior chemotherapy. There is no obvious infection. There is acral hyperpigmentation as previously noted   LAB RESULTS:  CMP     Component Value Date/Time   NA 140 05/02/2014 1601   K 4.0 05/02/2014 1601   CO2 22 05/02/2014 1601   GLUCOSE 102 05/02/2014 1601   BUN 14.1 05/02/2014 1601   CREATININE 0.8 05/02/2014 1601   CALCIUM 9.1 05/02/2014 1601   PROT 7.1 05/02/2014 1601   ALBUMIN 3.4* 05/02/2014 1601   AST 26 05/02/2014 1601   ALT 31 05/02/2014 1601   ALKPHOS 100 05/02/2014 1601   BILITOT 0.20 05/02/2014 1601    I No results found for: SPEP  Lab Results  Component Value Date   WBC 6.1 05/02/2014   NEUTROABS 3.9 05/02/2014   HGB 11.9 05/02/2014   HCT 38.2 05/02/2014   MCV 81.1 05/02/2014   PLT 300 05/02/2014      Chemistry      Component Value Date/Time   NA 140 05/02/2014 1601   K 4.0 05/02/2014 1601   CO2 22 05/02/2014 1601   BUN 14.1 05/02/2014 1601   CREATININE 0.8 05/02/2014 1601      Component Value Date/Time   CALCIUM 9.1 05/02/2014 1601   ALKPHOS 100 05/02/2014 1601   AST 26 05/02/2014 1601   ALT 31 05/02/2014 1601   BILITOT 0.20 05/02/2014 1601       No results found for: LABCA2  No components found for: LABCA125  No results for input(s): INR in the last 168 hours.  Urinalysis No results found for: COLORURINE  STUDIES: No results found.  ASSESSMENT: 44 y.o. BRCA negative [employer] woman status post right breast upper outer quadrant biopsy 08/08/2013 for a triple positive invasive ductal carcinoma, grade 2, with an MIB-1 of 52%, and a suspicious lymph node biopsied at the same time pathologically benign.  (1) genetics  testing found no mutations in ATM, BARD1, BRCA1, BRCA2, BRIP1, CDH1, CHEK2, MRE11A, MUTYH, NBN, NF1, PALB2, PTEN, RAD50, RAD51C, RAD51D, and TP53  (2) neoadjuvant chemotherapy consisted of carboplatin, docetaxel, trastuzumab and pertuzumab for 6 cycles, completed 12/21/2013  (a) pertuzumab discontinued after cycle 1 because of severe diarrhea  (b) carboplatin switched for cyclophosphamide because of hypersensitivity reaction on 11/20/13  (c) docetaxel removed from final cycle because of peripheral neuropathy symptoms.  (3) trastuzumab to continue to complete one year (through August 2016)  (a)  repeat echocardiogram 03/06/2014 shows a well preserved ejection fraction  (4) status post right lumpectomy and sentinel lymph node sampling 01/25/2014 showing a residual pT1b pN0 invasive ductal carcinoma, grade 2, again HER-2 amplified  (5) adjuvant radiation completed 05/03/2014  (6) anti-estrogens to follow radiation.  PLAN: Shanitra tolerated her radiation well and with complete those treatments tomorrow. I have checked an Parker and estradiol and they show her to be still essentially premenopausal. She has not yet resumed her periods but she may any time now. Accordingly we are going to go with tamoxifen and not anastrozole.  Today we discussed, and menopausal symptoms and reviewed how tamoxifen interacts with them. In particular he tends to worsen hot flashes. It really does not affect the weight, mood, or insomnia problems. It tends to help with the bone issues. Tamoxifen can also cause blood clots. In that regard it is helpful to know that she took oral contraceptives for many years with no clotting complications.  I think she needs a little bit of a respite so we are not going to start tamoxifen until the first Monday after her father's birthday and that we'll be May 16. She's, see me again in July. If she does not tolerate tamoxifen well we can proceed to Zoladex and then try anastrozole. That  obviously would be more complicated and likely produce more side effects.  She will benefit from exercise and I gave her a copy of the [place] program. I also suggest that she sign up for "finding your new normal". I also offered to send her to dermatology to remove the skin tag on her "behind" but she is to bring it up with her primary care physician first.  Jassica knows to call for any problems that may develop before her next visit.  Chauncey Cruel, MD 05/02/2014 4:39 PM

## 2014-05-03 ENCOUNTER — Encounter: Payer: Self-pay | Admitting: Radiation Oncology

## 2014-05-03 ENCOUNTER — Ambulatory Visit: Payer: BLUE CROSS/BLUE SHIELD

## 2014-05-03 ENCOUNTER — Ambulatory Visit (HOSPITAL_BASED_OUTPATIENT_CLINIC_OR_DEPARTMENT_OTHER): Payer: BLUE CROSS/BLUE SHIELD

## 2014-05-03 ENCOUNTER — Telehealth: Payer: Self-pay | Admitting: Oncology

## 2014-05-03 ENCOUNTER — Other Ambulatory Visit: Payer: BLUE CROSS/BLUE SHIELD

## 2014-05-03 ENCOUNTER — Other Ambulatory Visit: Payer: Self-pay | Admitting: Hematology and Oncology

## 2014-05-03 ENCOUNTER — Ambulatory Visit
Admission: RE | Admit: 2014-05-03 | Discharge: 2014-05-03 | Disposition: A | Discharge status: Home / Self Care | Payer: BLUE CROSS/BLUE SHIELD | Source: Ambulatory Visit | Attending: Radiation Oncology | Admitting: Radiation Oncology

## 2014-05-03 DIAGNOSIS — Z5112 Encounter for antineoplastic immunotherapy: Secondary | ICD-10-CM | POA: Diagnosis not present

## 2014-05-03 DIAGNOSIS — C50411 Malignant neoplasm of upper-outer quadrant of right female breast: Secondary | ICD-10-CM | POA: Diagnosis not present

## 2014-05-03 MED ORDER — ACETAMINOPHEN 160 MG/5ML PO SOLN
650.0000 mg | Freq: Once | ORAL | Status: AC
  Administered 2014-05-03: 650 mg via ORAL
  Filled 2014-05-03: qty 20.3

## 2014-05-03 MED ORDER — TRASTUZUMAB CHEMO INJECTION 440 MG
6.0000 mg/kg | Freq: Once | INTRAVENOUS | Status: AC
  Administered 2014-05-03: 840 mg via INTRAVENOUS
  Filled 2014-05-03: qty 40

## 2014-05-03 MED ORDER — DIPHENHYDRAMINE HCL 25 MG PO CAPS
25.0000 mg | ORAL_CAPSULE | Freq: Once | ORAL | Status: AC
  Administered 2014-05-03: 25 mg via ORAL

## 2014-05-03 MED ORDER — ACETAMINOPHEN 325 MG PO TABS
ORAL_TABLET | ORAL | Status: AC
Start: 2014-05-03 — End: 2014-05-03
  Filled 2014-05-03: qty 2

## 2014-05-03 MED ORDER — SODIUM CHLORIDE 0.9 % IJ SOLN
10.0000 mL | INTRAMUSCULAR | Status: DC | PRN
  Administered 2014-05-03: 10 mL
  Filled 2014-05-03: qty 10

## 2014-05-03 MED ORDER — DIPHENHYDRAMINE HCL 25 MG PO CAPS
ORAL_CAPSULE | ORAL | Status: AC
  Filled 2014-05-03: qty 1

## 2014-05-03 MED ORDER — SODIUM CHLORIDE 0.9 % IV SOLN
Freq: Once | INTRAVENOUS | Status: AC
  Administered 2014-05-03: 13:00:00 via INTRAVENOUS

## 2014-05-03 NOTE — Progress Notes (Signed)
Weekly Management Note:  Site: Right breast  Current Dose:  6100  cGy Projected Dose: 6100  cGy  Narrative: The patient is seen today for routine under treatment assessment. CBCT/MVCT images/port films were reviewed. The chart was reviewed.   She finishes her radiation therapy today.  She is without new complaints today.  Physical Examination: There were no vitals filed for this visit..  Weight:  .  There is marked hyperpigmentation the skin along the right breast and axilla with complete reepithelialization along with dry desquamation.  Impression: Tolerating radiation therapy well.  Radiation therapy completed today.  Plan: Follow-up visit in one month.

## 2014-05-03 NOTE — Patient Instructions (Signed)
[facility] Discharge Instructions for Patients Receiving Chemotherapy  Today you received the following chemotherapy agents:  Herceptin  To help prevent nausea and vomiting after your treatment, we encourage you to take your nausea medication as prescribed.   If you develop nausea and vomiting that is not controlled by your nausea medication, call the clinic.   BELOW ARE SYMPTOMS THAT SHOULD BE REPORTED IMMEDIATELY:  *FEVER GREATER THAN 100.5 F  *CHILLS WITH OR WITHOUT FEVER  NAUSEA AND VOMITING THAT IS NOT CONTROLLED WITH YOUR NAUSEA MEDICATION  *UNUSUAL SHORTNESS OF BREATH  *UNUSUAL BRUISING OR BLEEDING  TENDERNESS IN MOUTH AND THROAT WITH OR WITHOUT PRESENCE OF ULCERS  *URINARY PROBLEMS  *BOWEL PROBLEMS  UNUSUAL RASH Items with * indicate a potential emergency and should be followed up as soon as possible.  Feel free to call the clinic you have any questions or concerns. The clinic phone number is [phone].  Please show the CHEMO ALERT CARD at check-in to the Emergency Department and triage nurse.   

## 2014-05-03 NOTE — Progress Notes (Signed)
Simulation verification note: The patient underwent simulation verification on April 12 for her right breast boost.  Her isocenter was in good position and the multileaf collimators contoured the treatment volume appropriately.

## 2014-05-03 NOTE — Progress Notes (Signed)
[facility] End of Treatment Note  Name:[name]  Date: 05/03/2014 GUR:[id number] DOB:29-Aug-1970   Status:outpatient    CC: Horatio Pel, MD  Dr. Erroll Luna  REFERRING PHYSICIAN:  Dr. Erroll Luna   DIAGNOSIS:  Pathologic stage IA (yp T1b, ypN0, M0) invasive ductal/DCIS of the right breast  INDICATION FOR TREATMENT: Curative   TREATMENT DATES: 03/19/2014 through 05/03/2014                         SITE/DOSE:    Right breast 4500 cGy in 25 sessions, right breast boost 1600 cGy in 8 sessions                        BEAMS/ENERGY:  Tangential fields to the right breast with mixed 10 MV/15 MV photons.   4 field beam directly technique to her right breast tumor bed for her right breast boost with mixed 6 MV/10 MV photons.              NARRATIVE:  The patient tolerated treatment well with the expected degree of marked hyperpigmentation, and dry desquamation during her course of therapy.    She is Radioplex gel during her course of therapy.                      PLAN: Routine followup in one month. Patient instructed to call if questions or worsening complaints in interim.

## 2014-05-03 NOTE — Telephone Encounter (Signed)
per pof to sch pt appt-sent MW email to sch pt trmt-pt has MY and stated will review

## 2014-05-24 ENCOUNTER — Other Ambulatory Visit (HOSPITAL_BASED_OUTPATIENT_CLINIC_OR_DEPARTMENT_OTHER): Payer: BLUE CROSS/BLUE SHIELD

## 2014-05-24 ENCOUNTER — Ambulatory Visit (HOSPITAL_BASED_OUTPATIENT_CLINIC_OR_DEPARTMENT_OTHER): Payer: BLUE CROSS/BLUE SHIELD

## 2014-05-24 ENCOUNTER — Ambulatory Visit: Payer: BLUE CROSS/BLUE SHIELD

## 2014-05-24 VITALS — BP 136/83 | HR 99 | Temp 98.8°F | Resp 18

## 2014-05-24 DIAGNOSIS — Z95828 Presence of other vascular implants and grafts: Secondary | ICD-10-CM

## 2014-05-24 DIAGNOSIS — C50411 Malignant neoplasm of upper-outer quadrant of right female breast: Secondary | ICD-10-CM

## 2014-05-24 DIAGNOSIS — Z5112 Encounter for antineoplastic immunotherapy: Secondary | ICD-10-CM

## 2014-05-24 LAB — COMPREHENSIVE METABOLIC PANEL (CC13)
ALK PHOS: 97 U/L (ref 40–150)
ALT: 24 U/L (ref 0–55)
AST: 19 U/L (ref 5–34)
Albumin: 3.2 g/dL — ABNORMAL LOW (ref 3.5–5.0)
Anion Gap: 9 mEq/L (ref 3–11)
BUN: 13.8 mg/dL (ref 7.0–26.0)
CHLORIDE: 106 meq/L (ref 98–109)
CO2: 24 mEq/L (ref 22–29)
Calcium: 9.2 mg/dL (ref 8.4–10.4)
Creatinine: 0.7 mg/dL (ref 0.6–1.1)
EGFR: >90 mL/min/{1.73_m2} (ref >90)
Glucose: 89 mg/dl (ref 70–140)
Potassium: 4.1 mEq/L (ref 3.5–5.1)
SODIUM: 140 meq/L (ref 136–145)
TOTAL PROTEIN: 7 g/dL (ref 6.4–8.3)
Total Bilirubin: 0.29 mg/dL (ref 0.20–1.20)

## 2014-05-24 LAB — CBC WITH DIFFERENTIAL/PLATELET
BASO%: 0.3 % (ref 0.0–2.0)
Basophils Absolute: 0 10*3/uL (ref 0.0–0.1)
EOS%: 1.2 % (ref 0.0–7.0)
Eosinophils Absolute: 0.1 10*3/uL (ref 0.0–0.5)
HCT: 37.5 % (ref 34.8–46.6)
HGB: 12.1 g/dL (ref 11.6–15.9)
LYMPH#: 1.6 10*3/uL (ref 0.9–3.3)
LYMPH%: 26.7 % (ref 14.0–49.7)
MCH: 26 pg (ref 25.1–34.0)
MCHC: 32.3 g/dL (ref 31.5–36.0)
MCV: 80.6 fL (ref 79.5–101.0)
MONO#: 0.6 10*3/uL (ref 0.1–0.9)
MONO%: 9.1 % (ref 0.0–14.0)
NEUT#: 3.8 10*3/uL (ref 1.5–6.5)
NEUT%: 62.7 % (ref 38.4–76.8)
Platelets: 302 10*3/uL (ref 145–400)
RBC: 4.65 10*6/uL (ref 3.70–5.45)
RDW: 16.8 % — AB (ref 11.2–14.5)
WBC: 6 10*3/uL (ref 3.9–10.3)

## 2014-05-24 MED ORDER — SODIUM CHLORIDE 0.9 % IV SOLN
Freq: Once | INTRAVENOUS | Status: AC
  Administered 2014-05-24: 13:00:00 via INTRAVENOUS

## 2014-05-24 MED ORDER — SODIUM CHLORIDE 0.9 % IJ SOLN
10.0000 mL | INTRAMUSCULAR | Status: DC | PRN
  Administered 2014-05-24: 10 mL via INTRAVENOUS
  Filled 2014-05-24: qty 10

## 2014-05-24 MED ORDER — DIPHENHYDRAMINE HCL 25 MG PO CAPS
25.0000 mg | ORAL_CAPSULE | Freq: Once | ORAL | Status: AC
  Administered 2014-05-24: 25 mg via ORAL

## 2014-05-24 MED ORDER — SODIUM CHLORIDE 0.9 % IJ SOLN
10.0000 mL | INTRAMUSCULAR | Status: DC | PRN
  Administered 2014-05-24: 10 mL
  Filled 2014-05-24: qty 10

## 2014-05-24 MED ORDER — ACETAMINOPHEN 160 MG/5ML PO SOLN
650.0000 mg | Freq: Once | ORAL | Status: AC
  Administered 2014-05-24: 650 mg via ORAL
  Filled 2014-05-24: qty 20.3

## 2014-05-24 MED ORDER — DIPHENHYDRAMINE HCL 25 MG PO CAPS
ORAL_CAPSULE | ORAL | Status: AC
  Filled 2014-05-24: qty 2

## 2014-05-24 MED ORDER — TRASTUZUMAB CHEMO INJECTION 440 MG
6.0000 mg/kg | Freq: Once | INTRAVENOUS | Status: AC
  Administered 2014-05-24: 840 mg via INTRAVENOUS
  Filled 2014-05-24: qty 40

## 2014-05-24 MED ORDER — HEPARIN SOD (PORK) LOCK FLUSH 100 UNIT/ML IV SOLN
500.0000 [IU] | Freq: Once | INTRAVENOUS | Status: AC | PRN
  Administered 2014-05-24: 500 [IU]
  Filled 2014-05-24: qty 5

## 2014-05-24 NOTE — Patient Instructions (Signed)
[facility] Discharge Instructions for Patients Receiving Chemotherapy  Today you received the following chemotherapy agents: Herceptin   To help prevent nausea and vomiting after your treatment, we encourage you to take your nausea medication as directed.    If you develop nausea and vomiting that is not controlled by your nausea medication, call the clinic.   BELOW ARE SYMPTOMS THAT SHOULD BE REPORTED IMMEDIATELY:  *FEVER GREATER THAN 100.5 F  *CHILLS WITH OR WITHOUT FEVER  NAUSEA AND VOMITING THAT IS NOT CONTROLLED WITH YOUR NAUSEA MEDICATION  *UNUSUAL SHORTNESS OF BREATH  *UNUSUAL BRUISING OR BLEEDING  TENDERNESS IN MOUTH AND THROAT WITH OR WITHOUT PRESENCE OF ULCERS  *URINARY PROBLEMS  *BOWEL PROBLEMS  UNUSUAL RASH Items with * indicate a potential emergency and should be followed up as soon as possible.  Feel free to call the clinic you have any questions or concerns. The clinic phone number is [phone].  Please show the CHEMO ALERT CARD at check-in to the Emergency Department and triage nurse.   

## 2014-05-24 NOTE — Patient Instructions (Signed)

## 2014-06-01 ENCOUNTER — Encounter: Payer: Self-pay | Admitting: Radiation Oncology

## 2014-06-05 ENCOUNTER — Encounter: Payer: Self-pay | Admitting: Radiation Oncology

## 2014-06-05 ENCOUNTER — Ambulatory Visit
Admission: RE | Admit: 2014-06-05 | Discharge: 2014-06-05 | Disposition: A | Discharge status: Home / Self Care | Payer: BLUE CROSS/BLUE SHIELD | Source: Ambulatory Visit | Attending: Radiation Oncology | Admitting: Radiation Oncology

## 2014-06-05 VITALS — BP 131/80 | HR 102 | Temp 98.1°F | Ht 65.5 in | Wt 316.1 lb

## 2014-06-05 DIAGNOSIS — C50411 Malignant neoplasm of upper-outer quadrant of right female breast: Secondary | ICD-10-CM

## 2014-06-05 DIAGNOSIS — Z923 Personal history of irradiation: Secondary | ICD-10-CM | POA: Diagnosis not present

## 2014-06-05 DIAGNOSIS — Z08 Encounter for follow-up examination after completed treatment for malignant neoplasm: Secondary | ICD-10-CM | POA: Insufficient documentation

## 2014-06-05 DIAGNOSIS — Z853 Personal history of malignant neoplasm of breast: Secondary | ICD-10-CM | POA: Insufficient documentation

## 2014-06-05 HISTORY — DX: Personal history of irradiation: Z92.3

## 2014-06-05 MED ORDER — BIAFINE EX EMUL
CUTANEOUS | Status: DC | PRN
  Administered 2014-06-05: 14:00:00 via TOPICAL

## 2014-06-05 NOTE — Progress Notes (Signed)
CC: Dr. Erroll Luna   Follow-up note:   [name] returns today approximately 5 weeks following completion of radiation therapy following conservative surgery in the management of her pathologic stage 1 Day invasive ductal/DCIS the right breast. She is without complaints today. She started adjuvant tamoxifen on May 23. She presents she will see Dr. Brantley Stage for a follow-up visit in 4-6 months. She had a pre-radiation mammogram to confirm removal of suspicious microcalcifications.   Physical examination: Alert and oriented. Filed Vitals:   06/05/14 1140  BP: 131/80  Pulse: 102  Temp: 98.1 F (36.7 C)    Nodes: There is no palpable cervical, supraclavicular , or axillary lymphadenopathy. Breast: there is hyperpigmentation the skin along the right breast  With moderate thickening as expected.  No masses are appreciated. Extremities: Without edema.   Impression: Satisfactory progress.   Plan: Follow-up through Dr. Jana Hakim and his team. She will also see Dr. Brantley Stage for a follow-up visit in 4-6 months.  I think she can wait until late 2016  For her mammography.  I've not scheduled the patient for a formal follow-up visit, but I would more than happy see her in the future should the need arise.

## 2014-06-05 NOTE — Addendum Note (Signed)
Encounter addended by: Benn Moulder, RN on: 06/05/2014  1:33 PM<BR>     Documentation filed: Inpatient MAR

## 2014-06-05 NOTE — Addendum Note (Signed)
Encounter addended by: Benn Moulder, RN on: 06/05/2014  1:37 PM<BR>     Documentation filed: Charges VN

## 2014-06-05 NOTE — Progress Notes (Signed)
Ms. Veiga here today for reassessment s/p SRT to her right breast.  She started Tamoxifen on 06/04/14 and reports new achy sensation in breast, heel pain and some muscle aches.  She also notes an increase in fatigue since starting Tamoxifen.   Note marked decrease in hyperpigmentation in her tx field.  Skin is soft and intact.

## 2014-06-13 ENCOUNTER — Other Ambulatory Visit: Payer: Self-pay | Admitting: *Deleted

## 2014-06-13 DIAGNOSIS — C50411 Malignant neoplasm of upper-outer quadrant of right female breast: Secondary | ICD-10-CM

## 2014-06-14 ENCOUNTER — Ambulatory Visit: Payer: BLUE CROSS/BLUE SHIELD

## 2014-06-14 ENCOUNTER — Telehealth: Payer: Self-pay | Admitting: Nurse Practitioner

## 2014-06-14 ENCOUNTER — Other Ambulatory Visit (HOSPITAL_BASED_OUTPATIENT_CLINIC_OR_DEPARTMENT_OTHER): Payer: BLUE CROSS/BLUE SHIELD

## 2014-06-14 ENCOUNTER — Ambulatory Visit (HOSPITAL_BASED_OUTPATIENT_CLINIC_OR_DEPARTMENT_OTHER): Payer: BLUE CROSS/BLUE SHIELD

## 2014-06-14 ENCOUNTER — Ambulatory Visit (HOSPITAL_BASED_OUTPATIENT_CLINIC_OR_DEPARTMENT_OTHER): Payer: BLUE CROSS/BLUE SHIELD | Admitting: Nurse Practitioner

## 2014-06-14 ENCOUNTER — Other Ambulatory Visit: Payer: BLUE CROSS/BLUE SHIELD

## 2014-06-14 ENCOUNTER — Encounter: Payer: Self-pay | Admitting: Nurse Practitioner

## 2014-06-14 VITALS — BP 109/67 | HR 83 | Temp 98.2°F | Resp 18 | Ht 65.5 in | Wt 314.3 lb

## 2014-06-14 DIAGNOSIS — N951 Menopausal and female climacteric states: Secondary | ICD-10-CM | POA: Diagnosis not present

## 2014-06-14 DIAGNOSIS — Z95828 Presence of other vascular implants and grafts: Secondary | ICD-10-CM

## 2014-06-14 DIAGNOSIS — M7989 Other specified soft tissue disorders: Secondary | ICD-10-CM | POA: Diagnosis not present

## 2014-06-14 DIAGNOSIS — Z5112 Encounter for antineoplastic immunotherapy: Secondary | ICD-10-CM

## 2014-06-14 DIAGNOSIS — C50411 Malignant neoplasm of upper-outer quadrant of right female breast: Secondary | ICD-10-CM

## 2014-06-14 DIAGNOSIS — R232 Flushing: Secondary | ICD-10-CM | POA: Insufficient documentation

## 2014-06-14 DIAGNOSIS — Z170 Estrogen receptor positive status [ER+]: Secondary | ICD-10-CM

## 2014-06-14 DIAGNOSIS — M25473 Effusion, unspecified ankle: Secondary | ICD-10-CM | POA: Insufficient documentation

## 2014-06-14 LAB — CBC WITH DIFFERENTIAL/PLATELET
BASO%: 0.6 % (ref 0.0–2.0)
Basophils Absolute: 0 10*3/uL (ref 0.0–0.1)
EOS%: 1.1 % (ref 0.0–7.0)
Eosinophils Absolute: 0.1 10*3/uL (ref 0.0–0.5)
HCT: 37.1 % (ref 34.8–46.6)
HEMOGLOBIN: 11.8 g/dL (ref 11.6–15.9)
LYMPH%: 29.7 % (ref 14.0–49.7)
MCH: 25.4 pg (ref 25.1–34.0)
MCHC: 31.8 g/dL (ref 31.5–36.0)
MCV: 79.9 fL (ref 79.5–101.0)
MONO#: 0.4 10*3/uL (ref 0.1–0.9)
MONO%: 7.3 % (ref 0.0–14.0)
NEUT#: 3.2 10*3/uL (ref 1.5–6.5)
NEUT%: 61.3 % (ref 38.4–76.8)
Platelets: 283 10*3/uL (ref 145–400)
RBC: 4.64 10*6/uL (ref 3.70–5.45)
RDW: 17.1 % — AB (ref 11.2–14.5)
WBC: 5.3 10*3/uL (ref 3.9–10.3)
lymph#: 1.6 10*3/uL (ref 0.9–3.3)

## 2014-06-14 LAB — COMPREHENSIVE METABOLIC PANEL (CC13)
ALBUMIN: 3.1 g/dL — AB (ref 3.5–5.0)
ALK PHOS: 85 U/L (ref 40–150)
ALT: 21 U/L (ref 0–55)
AST: 22 U/L (ref 5–34)
Anion Gap: 8 mEq/L (ref 3–11)
BUN: 12.7 mg/dL (ref 7.0–26.0)
CO2: 26 meq/L (ref 22–29)
Calcium: 8.7 mg/dL (ref 8.4–10.4)
Chloride: 108 mEq/L (ref 98–109)
Creatinine: 1 mg/dL (ref 0.6–1.1)
EGFR: 78 mL/min/{1.73_m2} — ABNORMAL LOW (ref >90)
Glucose: 97 mg/dl (ref 70–140)
Potassium: 3.9 mEq/L (ref 3.5–5.1)
Sodium: 142 mEq/L (ref 136–145)
TOTAL PROTEIN: 6.8 g/dL (ref 6.4–8.3)
Total Bilirubin: 0.21 mg/dL (ref 0.20–1.20)

## 2014-06-14 MED ORDER — GABAPENTIN 300 MG PO CAPS: 300.0000 mg | ORAL_CAPSULE | Freq: Every day | ORAL | Status: DC

## 2014-06-14 MED ORDER — TRASTUZUMAB CHEMO INJECTION 440 MG
6.0000 mg/kg | Freq: Once | INTRAVENOUS | Status: AC
  Administered 2014-06-14: 840 mg via INTRAVENOUS
  Filled 2014-06-14: qty 40

## 2014-06-14 MED ORDER — DIPHENHYDRAMINE HCL 25 MG PO CAPS
ORAL_CAPSULE | ORAL | Status: AC
  Filled 2014-06-14: qty 1

## 2014-06-14 MED ORDER — SODIUM CHLORIDE 0.9 % IJ SOLN
10.0000 mL | INTRAMUSCULAR | Status: DC | PRN
Start: 2014-06-14 — End: 2014-06-14
  Administered 2014-06-14: 10 mL
  Filled 2014-06-14: qty 10

## 2014-06-14 MED ORDER — HEPARIN SOD (PORK) LOCK FLUSH 100 UNIT/ML IV SOLN
500.0000 [IU] | Freq: Once | INTRAVENOUS | Status: AC | PRN
  Administered 2014-06-14: 500 [IU]
  Filled 2014-06-14: qty 5

## 2014-06-14 MED ORDER — SODIUM CHLORIDE 0.9 % IJ SOLN
10.0000 mL | INTRAMUSCULAR | Status: DC | PRN
  Administered 2014-06-14: 10 mL via INTRAVENOUS
  Filled 2014-06-14: qty 10

## 2014-06-14 MED ORDER — DIPHENHYDRAMINE HCL 25 MG PO CAPS
25.0000 mg | ORAL_CAPSULE | Freq: Once | ORAL | Status: AC
  Administered 2014-06-14: 25 mg via ORAL

## 2014-06-14 MED ORDER — SODIUM CHLORIDE 0.9 % IV SOLN
Freq: Once | INTRAVENOUS | Status: AC
  Administered 2014-06-14: 12:00:00 via INTRAVENOUS

## 2014-06-14 MED ORDER — ACETAMINOPHEN 160 MG/5ML PO SOLN
650.0000 mg | Freq: Once | ORAL | Status: AC
  Administered 2014-06-14: 650 mg via ORAL
  Filled 2014-06-14: qty 20.3

## 2014-06-14 NOTE — Patient Instructions (Signed)
[facility] Discharge Instructions for Patients Receiving Chemotherapy  Today you received the following chemotherapy agents:  Herceptin  To help prevent nausea and vomiting after your treatment, we encourage you to take your nausea medication as ordered per MD.   If you develop nausea and vomiting that is not controlled by your nausea medication, call the clinic.   BELOW ARE SYMPTOMS THAT SHOULD BE REPORTED IMMEDIATELY:  *FEVER GREATER THAN 100.5 F  *CHILLS WITH OR WITHOUT FEVER  NAUSEA AND VOMITING THAT IS NOT CONTROLLED WITH YOUR NAUSEA MEDICATION  *UNUSUAL SHORTNESS OF BREATH  *UNUSUAL BRUISING OR BLEEDING  TENDERNESS IN MOUTH AND THROAT WITH OR WITHOUT PRESENCE OF ULCERS  *URINARY PROBLEMS  *BOWEL PROBLEMS  UNUSUAL RASH Items with * indicate a potential emergency and should be followed up as soon as possible.  Feel free to call the clinic you have any questions or concerns. The clinic phone number is [phone].  Please show the CHEMO ALERT CARD at check-in to the Emergency Department and triage nurse.   

## 2014-06-14 NOTE — Progress Notes (Signed)
[place]  [phone] [phone]     ID: [name] DOB: 11/02/1970  MR#: [id number]  SLH#:[id number]  Patient Care Team: Deland Pretty, MD as PCP - General (Internal Medicine) Erroll Luna, MD as Consulting Physician (General Surgery) Chauncey Cruel, MD as Consulting Physician (Oncology) Everlene Farrier, MD as Consulting Physician (Obstetrics and Gynecology)  CHIEF COMPLAINT:  triple positive breast cancer  CURRENT TREATMENT:  Trastuzumab  BREAST CANCER HISTORY: From the original intake note:  The patient herself palpated a mass in her right breast mid July. She brought it to Dr. Sherlynn Stalls attention and he set the patient up for bilateral diagnostic mammography and right breast ultrasonography of the breast Center 07/28/2013. Right mammogram showed an irregular mass in the upper outer quadrant measuring 1.4 cm. There was some associated pleomorphic microcalcifications. This was palpable, at the 10:30 position 11 cm from the nipple. By palpation it measured approximately 1.5 cm. Ultrasound confirmed an hypoechoic mass with irregular borders measuring 1.1 cm. The right axilla showed a few adjacent deep lymph nodes with mild cortical thickening.  On 08/08/2013 the patient underwent biopsy of the breast mass in question. The pathology (SAA [id number]) showed an invasive ductal carcinoma, grade 2, estrogen receptor 49% positive, progesterone receptor 47% positive, both with moderate staining intensity, with an MIB-1 of 52%, and HER-2 amplification with a signals ratio of 8.1. The right axillary lymph node biopsied at the same time was benign this was felt to be possibly concordant.  And 08/15/2013 the patient underwent bilateral breast MRI showing a 2 cm irregular enhancing mass in the upper outer quadrant of the right breast. There were no additional changes no abnormal appearing lymph nodes and no masses or abnormalities in the left breast.  The  patient's subsequent history is as detailed below  INTERVAL HISTORY: [name] returns today for follow up of her breast cancer. She is due for trastuzumab and tolerates this drug well. She was started on tamoxifen at her last visit and believe it makes her drowsy 5-6 hrs later, so she started taking it late in the afternoon. Her hot flashes are significantly worse and she would like help with this. They tend to wake her up at night. Her vaginal dryness has improved. She has not seen the return of her periods yet.  REVIEW OF SYSTEMS: [name] denies fevers, chills, nausea, vomiting, or changes in bowel or bladder habits. She had a nosebleed once a few weeks ago, likely because of the dry air in her house. She has some mild joint stiffness. She has marked swelling to her bilateral ankles and feet, and this is potentially cause bilateral leg pain. She denies shortness of breath, chest pain, cough, or palpitations. A detailed review of systems is otherwise stable.  PAST MEDICAL HISTORY: Past Medical History  Diagnosis Date  . Anxiety   . Wears contact lenses   . Snores   . Malignant neoplasm of breast (female), unspecified site   . S/P radiation therapy 03/19/2014 through 05/03/2014     Right breast 4500 cGy in 25 sessions, right breast boost 1600 cGy in 8 sessions     PAST SURGICAL HISTORY: Past Surgical History  Procedure Laterality Date  . Wisdom tooth extraction    . Portacath placement N/A 08/23/2013    Procedure: INSERTION PORT-A-CATH WITH ULTRA SOUND;  Surgeon: Joyice Faster. Cornett, MD;  Location: [facility];  Service: General;  Laterality: N/A;  . Breast lumpectomy with radioactive seed localization Right 01/25/2014  Procedure: RIGHT BREAST LUMPECTOMY WITH RADIOACTIVE SEED LOCALIZATION ;  Surgeon: Erroll Luna, MD;  Location: [facility];  Service: General;  Laterality: Right;  . Axillary  sentinel node biopsy Right 01/25/2014    Procedure: RIGHT SENTINEL LYMPH NODE MAPPING;  Surgeon: Erroll Luna, MD;  Location: [facility];  Service: General;  Laterality: Right;    FAMILY HISTORY Family History  Problem Relation Age of Onset  . Uterine cancer Mother 83    TAH/BSO  . Colon cancer Father 2    deceased 43  . Leukemia Maternal Grandmother     deceased 68  . Skin cancer Paternal Grandmother   . Prostate cancer Other    the patient's father died from colon cancer the age of 31. It had been diagnosed 3 years prior. The patient's mother was diagnosed with cancer of the uterus at age 44. She is 45 years old as of August 2015. The patient had no brothers or sisters. There is a history of prostate cancer leukemia at in the family as well as melanoma, but there is no history of breast or ovarian cancer in the family.  GYNECOLOGIC HISTORY:  No LMP recorded. Patient is not currently having periods (Reason: Chemotherapy). Menarche age 67. The patient is GX P0. She used oral contraceptives for several years remotely with no complications. She stopped having periods after first cycle of chemotherapy.  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 She lives alone, with no pets.    ADVANCED DIRECTIVES: Not in place. At her 08/16/2013 visit the patient was given the upper. Documents 2 complete and notarize so she may name her mother, [name] as her healthcare power of attorney, which is the patient's intention. [name] can be reached at [phone]. She currently lives in [place] but is planning to move to [place]: History  Substance Use Topics  . Smoking status: Former Smoker    Quit date: 02/20/2013  . Smokeless tobacco: Not on file  . Alcohol Use: Yes     Comment: 1/week     Colonoscopy: Never  PAP: July 2015 Bone density: Never  Lipid panel:  Allergies  Allergen Reactions  .  Amoxicillin Rash  . Chocolate Rash    Only Hershey[employer] brand    Current Outpatient Prescriptions  Medication Sig Dispense Refill  . Calcium-Vitamin D-Vitamin K (CALCIUM SOFT CHEWS PO) Take by mouth daily.    Marland Kitchen emollient (BIAFINE) cream Apply topically as needed.    . lidocaine-prilocaine (EMLA) cream Apply 1 application topically as needed. 30 g 0  . Multiple Vitamins-Minerals (MULTI ADULT GUMMIES PO) Take 2 each by mouth daily.    . tamoxifen (NOLVADEX) 20 MG tablet Take 20 mg by mouth daily.    Marland Kitchen gabapentin (NEURONTIN) 300 MG capsule Take 1 capsule (300 mg total) by mouth at bedtime. 30 capsule 3  . non-metallic deodorant (ALRA) MISC Apply 1 application topically.     No current facility-administered medications for this visit.   Facility-Administered Medications Ordered in Other Visits  Medication Dose Route Frequency Provider Last Rate Last Dose  . sodium chloride 0.9 % injection 10 mL  10 mL Intravenous PRN Laurie Panda, NP   10 mL at 06/14/14 1119  . sodium chloride 0.9 % injection 10 mL  10 mL Intracatheter PRN Chauncey Cruel, MD      . trastuzumab (HERCEPTIN) 840 mg in sodium chloride 0.9 %  250 mL chemo infusion  6 mg/kg (Treatment Plan Actual) Intravenous Once Chauncey Cruel, MD        OBJECTIVE: Middle-aged [place] American woman who appears stated age 44 Vitals:   06/14/14 1133  BP: 109/67  Pulse: 83  Temp: 98.2 F (36.8 C)  Resp: 18     Body mass index is 51.49 kg/(m^2).     ECOG FS:1 - Symptomatic but completely ambulatory   Skin: warm, dry  HEENT: sclerae anicteric, conjunctivae pink, oropharynx clear. No thrush or mucositis.  Lymph Nodes: No cervical or supraclavicular lymphadenopathy  Lungs: clear to auscultation bilaterally, no rales, wheezes, or rhonci  Heart: regular rate and rhythm  Abdomen: round, soft, non tender, positive bowel sounds  Musculoskeletal: No [name] spinal tenderness, bilateral lower extremity +3 edema  Neuro: non [name], well  oriented, positive affect  Breasts: deferred   LAB RESULTS:  CMP     Component Value Date/Time   NA 142 06/14/2014 1110   K 3.9 06/14/2014 1110   CO2 26 06/14/2014 1110   GLUCOSE 97 06/14/2014 1110   BUN 12.7 06/14/2014 1110   CREATININE 1.0 06/14/2014 1110   CALCIUM 8.7 06/14/2014 1110   PROT 6.8 06/14/2014 1110   ALBUMIN 3.1* 06/14/2014 1110   AST 22 06/14/2014 1110   ALT 21 06/14/2014 1110   ALKPHOS 85 06/14/2014 1110   BILITOT 0.21 06/14/2014 1110    I No results found for: SPEP  Lab Results  Component Value Date   WBC 5.3 06/14/2014   NEUTROABS 3.2 06/14/2014   HGB 11.8 06/14/2014   HCT 37.1 06/14/2014   MCV 79.9 06/14/2014   PLT 283 06/14/2014      Chemistry      Component Value Date/Time   NA 142 06/14/2014 1110   K 3.9 06/14/2014 1110   CO2 26 06/14/2014 1110   BUN 12.7 06/14/2014 1110   CREATININE 1.0 06/14/2014 1110      Component Value Date/Time   CALCIUM 8.7 06/14/2014 1110   ALKPHOS 85 06/14/2014 1110   AST 22 06/14/2014 1110   ALT 21 06/14/2014 1110   BILITOT 0.21 06/14/2014 1110       No results found for: LABCA2  No components found for: LABCA125  No results for input(s): INR in the last 168 hours.  Urinalysis No results found for: COLORURINE  STUDIES: No results found.  ASSESSMENT: 44 y.o. BRCA negative [place] woman status post right breast upper outer quadrant biopsy 08/08/2013 for a triple positive invasive ductal carcinoma, grade 2, with an MIB-1 of 52%, and a suspicious lymph node biopsied at the same time pathologically benign.  (1) genetics testing found no mutations in ATM, BARD1, BRCA1, BRCA2, BRIP1, CDH1, CHEK2, MRE11A, MUTYH, NBN, NF1, PALB2, PTEN, RAD50, RAD51C, RAD51D, and TP53  (2) neoadjuvant chemotherapy consisted of carboplatin, docetaxel, trastuzumab and pertuzumab for 6 cycles, completed 12/21/2013  (a) pertuzumab discontinued after cycle 1 because of severe diarrhea  (b) carboplatin switched for  cyclophosphamide because of hypersensitivity reaction on 11/20/13  (c) docetaxel removed from final cycle because of peripheral neuropathy symptoms.  (3) trastuzumab to continue to complete one year (through August 2016)  (a) repeat echocardiogram 03/06/2014 shows a well preserved ejection fraction  (4) status post right lumpectomy and sentinel lymph node sampling 01/25/2014 showing a residual pT1b pN0 invasive ductal carcinoma, grade 2, again HER-2 amplified  (5) adjuvant radiation completed 05/03/2014  (6) anti-estrogens to follow radiation.  PLAN: Jasmaine is doing well today. The labs were reviewed in  detail and were entirely stable. She is overdue for a repeat echocardiogram, so I have placed orders for that to be performed ASAP. She will proceed with her next trastuzumab as planned today.   For her hot flashes we discussed a few options including venlafaxine and gabapentin. She will start with 388m gabapentin QHS first. She knows the main side effect of this drug is drowsiness, which will be beneficial in her case.   RErishahas significant swelling to her bilateral lower extremities. I have advised her to consume low sodium unprocessed foods. She will elevate her legs when sitting or sleeping. I have also encouraged her to pursue compression stockings.   She has been inactive and I encouraged her to begin a walking program.   RMarieelenawill continue trastuzumab every 3 weeks and will return for an office visit in 9 weeks. She understands and agrees with this plan. She knows the goal of treatment in her case is cure. She has been encouraged to call with any issues that might arise before her next visit here.    HLaurie Panda NP 06/14/2014 12:42 PM

## 2014-06-14 NOTE — Patient Instructions (Signed)

## 2014-06-14 NOTE — Telephone Encounter (Signed)
Appointments made and patient will get a new avs in chemo °

## 2014-06-15 ENCOUNTER — Telehealth: Payer: Self-pay | Admitting: Nurse Practitioner

## 2014-06-15 NOTE — Telephone Encounter (Signed)
Called patient and she is aware of her echo appointment  °

## 2014-06-18 ENCOUNTER — Other Ambulatory Visit: Payer: Self-pay | Admitting: *Deleted

## 2014-06-21 ENCOUNTER — Emergency Department (HOSPITAL_COMMUNITY)
Admission: EM | Admit: 2014-06-21 | Discharge: 2014-06-22 | Disposition: A | Discharge status: Home / Self Care | Payer: BLUE CROSS/BLUE SHIELD | Attending: Emergency Medicine | Admitting: Emergency Medicine

## 2014-06-21 ENCOUNTER — Encounter (HOSPITAL_COMMUNITY): Payer: Self-pay

## 2014-06-21 ENCOUNTER — Telehealth: Payer: Self-pay | Admitting: Nurse Practitioner

## 2014-06-21 ENCOUNTER — Encounter: Payer: Self-pay | Admitting: Nurse Practitioner

## 2014-06-21 DIAGNOSIS — Z87891 Personal history of nicotine dependence: Secondary | ICD-10-CM | POA: Diagnosis not present

## 2014-06-21 DIAGNOSIS — R509 Fever, unspecified: Secondary | ICD-10-CM | POA: Insufficient documentation

## 2014-06-21 DIAGNOSIS — R519 Headache, unspecified: Secondary | ICD-10-CM

## 2014-06-21 DIAGNOSIS — Z8659 Personal history of other mental and behavioral disorders: Secondary | ICD-10-CM | POA: Insufficient documentation

## 2014-06-21 DIAGNOSIS — Z79899 Other long term (current) drug therapy: Secondary | ICD-10-CM | POA: Diagnosis not present

## 2014-06-21 DIAGNOSIS — Z973 Presence of spectacles and contact lenses: Secondary | ICD-10-CM | POA: Diagnosis not present

## 2014-06-21 DIAGNOSIS — R51 Headache: Secondary | ICD-10-CM | POA: Insufficient documentation

## 2014-06-21 DIAGNOSIS — Z880 Allergy status to penicillin: Secondary | ICD-10-CM | POA: Insufficient documentation

## 2014-06-21 DIAGNOSIS — R6883 Chills (without fever): Secondary | ICD-10-CM

## 2014-06-21 DIAGNOSIS — M791 Myalgia, unspecified site: Secondary | ICD-10-CM

## 2014-06-21 DIAGNOSIS — Z853 Personal history of malignant neoplasm of breast: Secondary | ICD-10-CM | POA: Insufficient documentation

## 2014-06-21 NOTE — ED Notes (Signed)
Pt complains of a headache, chills and a fever for three days, pt takes tamoxifen and herceptin every three weeks for breast cancer.

## 2014-06-21 NOTE — Telephone Encounter (Signed)
Returned Advertising account executive. Confirmed appointment moved to later time 06/23.

## 2014-06-22 ENCOUNTER — Emergency Department (HOSPITAL_COMMUNITY): Payer: BLUE CROSS/BLUE SHIELD

## 2014-06-22 ENCOUNTER — Other Ambulatory Visit: Payer: Self-pay | Admitting: *Deleted

## 2014-06-22 LAB — URINE MICROSCOPIC-ADD ON

## 2014-06-22 LAB — URINALYSIS, ROUTINE W REFLEX MICROSCOPIC
GLUCOSE, UA: NEGATIVE mg/dL
Hgb urine dipstick: NEGATIVE
Ketones, ur: NEGATIVE mg/dL
NITRITE: NEGATIVE
Protein, ur: 100 mg/dL — AB
Specific Gravity, Urine: 1.033 — ABNORMAL HIGH (ref 1.005–1.030)
Urobilinogen, UA: 0.2 mg/dL (ref 0.0–1.0)
pH: 5 (ref 5.0–8.0)

## 2014-06-22 LAB — CBC WITH DIFFERENTIAL/PLATELET
Basophils Absolute: 0 10*3/uL (ref 0.0–0.1)
Basophils Relative: 1 % (ref 0–1)
EOS ABS: 0.3 10*3/uL (ref 0.0–0.7)
Eosinophils Relative: 4 % (ref 0–5)
HEMATOCRIT: 39.7 % (ref 36.0–46.0)
HEMOGLOBIN: 12.5 g/dL (ref 12.0–15.0)
Lymphocytes Relative: 22 % (ref 12–46)
Lymphs Abs: 1.4 10*3/uL (ref 0.7–4.0)
MCH: 25.2 pg — AB (ref 26.0–34.0)
MCHC: 31.5 g/dL (ref 30.0–36.0)
MCV: 79.9 fL (ref 78.0–100.0)
MONO ABS: 0.2 10*3/uL (ref 0.1–1.0)
Monocytes Relative: 3 % (ref 3–12)
NEUTROS ABS: 4.7 10*3/uL (ref 1.7–7.7)
NEUTROS PCT: 70 % (ref 43–77)
Platelets: 193 10*3/uL (ref 150–400)
RBC: 4.97 MIL/uL (ref 3.87–5.11)
RDW: 17 % — ABNORMAL HIGH (ref 11.5–15.5)
WBC: 6.6 10*3/uL (ref 4.0–10.5)

## 2014-06-22 LAB — I-STAT CHEM 8, ED
BUN: 16 mg/dL (ref 6–20)
Calcium, Ion: 1.03 mmol/L — ABNORMAL LOW (ref 1.12–1.23)
Chloride: 102 mmol/L (ref 101–111)
Creatinine, Ser: 1.1 mg/dL — ABNORMAL HIGH (ref 0.44–1.00)
Glucose, Bld: 132 mg/dL — ABNORMAL HIGH (ref 65–99)
HCT: 42 % (ref 36.0–46.0)
Hemoglobin: 14.3 g/dL (ref 12.0–15.0)
Potassium: 4.1 mmol/L (ref 3.5–5.1)
Sodium: 133 mmol/L — ABNORMAL LOW (ref 135–145)
TCO2: 21 mmol/L (ref 0–100)

## 2014-06-22 MED ORDER — HYDROCODONE-ACETAMINOPHEN 5-325 MG PO TABS
1.0000 | ORAL_TABLET | Freq: Once | ORAL | Status: AC
  Administered 2014-06-22: 1 via ORAL
  Filled 2014-06-22: qty 1

## 2014-06-22 MED ORDER — NITROFURANTOIN MONOHYD MACRO 100 MG PO CAPS: 100.0000 mg | ORAL_CAPSULE | Freq: Every day | ORAL | Status: DC

## 2014-06-22 MED ORDER — SODIUM CHLORIDE 0.9 % IV BOLUS (SEPSIS)
1000.0000 mL | Freq: Once | INTRAVENOUS | Status: AC
  Administered 2014-06-22: 1000 mL via INTRAVENOUS

## 2014-06-22 NOTE — ED Notes (Signed)
Patient transported to X-ray 

## 2014-06-22 NOTE — Telephone Encounter (Signed)
This RN returned call to pt per VM left this AM.  Noted pt called on call service pm 6/9 with fever and was advised to proceed to the ER.  Per discussion with pt she states she has an ongoing headache this am. [name] states she left the ER at 5am post being given NS and Vicodin.  Per Dr Gerarda Fraction review- recommended pt to start nitrofurantion.  Pt is to push fluids.  Above discussed with pt as well as use of OTC meds for current symptoms.  Pt will call this RN on Monday with update.

## 2014-06-22 NOTE — Discharge Instructions (Signed)
In IQ evaluated for chills, headache, and myalgias.  Her labwork was all within normal parameters urine does not show any sign of infection.  Your chest x-ray is normal.  Please continue to take alternating doses of Tylenol, ibuprofen, please call your primary care physician in the morning for recheck and follow-up

## 2014-06-22 NOTE — ED Provider Notes (Signed)
CSN: [id number]     Arrival date & time 06/21/14  2333 History   First MD Initiated Contact with Patient 06/21/14 2345     Chief Complaint  Patient presents with  . Fever     (Consider location/radiation/quality/duration/timing/severity/associated sxs/prior Treatment) HPI Comments: This is a 44 year old African-American female with a history of breast cancer who is status post chemotherapy, lumpectomy and radiation who is currently taking tamoxifen on a regular basis for the past month.  She reports that for the past several days she has had myalgias, chills and fatigue. She has not taken any medication for her symptoms  Patient is a 44 y.o. female presenting with fever. The history is provided by the patient.  Fever Temp source:  Subjective Severity:  Mild Onset quality:  Unable to specify Timing:  Unable to specify Progression:  Unable to specify Chronicity:  New Worsened by:  Nothing tried Associated symptoms: chills, headaches and myalgias   Associated symptoms: no chest pain, no congestion, no cough, no diarrhea, no dysuria, no ear pain, no nausea, no rash, no rhinorrhea, no somnolence, no sore throat and no vomiting   Headaches:    Severity:  Mild   Onset quality:  Unable to specify Risk factors: hx of cancer     Past Medical History  Diagnosis Date  . Anxiety   . Wears contact lenses   . Snores   . Malignant neoplasm of breast (female), unspecified site   . S/P radiation therapy 03/19/2014 through 05/03/2014     Right breast 4500 cGy in 25 sessions, right breast boost 1600 cGy in 8 sessions    Past Surgical History  Procedure Laterality Date  . Wisdom tooth extraction    . Portacath placement N/A 08/23/2013    Procedure: INSERTION PORT-A-CATH WITH ULTRA SOUND;  Surgeon: Joyice Faster. Cornett, MD;  Location: [facility];  Service: General;  Laterality: N/A;  . Breast lumpectomy with  radioactive seed localization Right 01/25/2014    Procedure: RIGHT BREAST LUMPECTOMY WITH RADIOACTIVE SEED LOCALIZATION ;  Surgeon: Erroll Luna, MD;  Location: [facility];  Service: General;  Laterality: Right;  . Axillary sentinel node biopsy Right 01/25/2014    Procedure: RIGHT SENTINEL LYMPH NODE MAPPING;  Surgeon: Erroll Luna, MD;  Location: [facility];  Service: General;  Laterality: Right;   Family History  Problem Relation Age of Onset  . Uterine cancer Mother 74    TAH/BSO  . Colon cancer Father 60    deceased 62  . Leukemia Maternal Grandmother     deceased 55  . Skin cancer Paternal Grandmother   . Prostate cancer Other    History  Substance Use Topics  . Smoking status: Former Smoker    Quit date: 02/20/2013  . Smokeless tobacco: Not on file  . Alcohol Use: Yes     Comment: 1/week   OB History    No data available     Review of Systems  Constitutional: Positive for fever and chills.  HENT: Negative for congestion, ear pain, rhinorrhea, sore throat and trouble swallowing.   Respiratory: Negative for cough.   Cardiovascular: Negative for chest pain and leg swelling.  Gastrointestinal: Negative for nausea, vomiting, abdominal pain and diarrhea.  Genitourinary: Negative for dysuria.  Musculoskeletal: Positive for myalgias.  Skin: Negative for rash.  Neurological: Positive for headaches. Negative for dizziness.  All other systems reviewed and are negative.     Allergies  Amoxicillin and Chocolate  Home Medications  Prior to Admission medications   Medication Sig Start Date End Date Taking? Authorizing Provider  Calcium-Vitamin D-Vitamin K (CALCIUM SOFT CHEWS PO) Take 1 tablet by mouth daily.    Yes Historical Provider, MD  emollient (BIAFINE) cream Apply 1 application topically daily.    Yes Historical Provider, MD  gabapentin (NEURONTIN) 300 MG capsule Take 1 capsule (300 mg total) by mouth at bedtime. 06/14/14  Yes Laurie Panda, NP  lidocaine-prilocaine (EMLA) cream Apply 1 application topically as needed. Patient taking differently: Apply 1 application topically as needed (for port).  04/19/14  Yes Chauncey Cruel, MD  Multiple Vitamins-Minerals (MULTI ADULT GUMMIES PO) Take 3-4 each by mouth daily.    Yes Historical Provider, MD  non-metallic deodorant Jethro Poling) MISC Apply 1 application topically. 03/19/14  Yes Arloa Koh, MD  tamoxifen (NOLVADEX) 20 MG tablet Take 20 mg by mouth daily.   Yes Historical Provider, MD   BP 109/65 mmHg  Pulse 98  Temp(Src) 100.4 F (38 C) (Oral)  Resp 18  Ht 5\' 5"  (1.651 m)  Wt 315 lb (142.883 kg)  BMI 52.42 kg/m2  SpO2 98% Physical Exam  Constitutional: She is oriented to person, place, and time. She appears well-developed and well-nourished.  HENT:  Head: Normocephalic.  Right Ear: External ear normal.  Left Ear: External ear normal.  Mouth/Throat: Oropharynx is clear and moist.  Eyes: Pupils are equal, round, and reactive to light.  Neck: Normal range of motion.  Cardiovascular: Normal rate and regular rhythm.   Pulmonary/Chest: Effort normal and breath sounds normal.  Abdominal: Soft. She exhibits no distension.  Musculoskeletal: Normal range of motion. She exhibits no edema or tenderness.  Neurological: She is alert and oriented to person, place, and time.  Skin: Skin is warm and dry. No rash noted.  Nursing note and vitals reviewed.   ED Course  Procedures (including critical care time) Labs Review Labs Reviewed  CBC WITH DIFFERENTIAL/PLATELET - Abnormal; Notable for the following:    MCH 25.2 (*)    RDW 17.0 (*)    All other components within normal limits  URINALYSIS, ROUTINE W REFLEX MICROSCOPIC (NOT AT [facility]) - Abnormal; Notable for the following:    Color, Urine ORANGE (*)    APPearance TURBID (*)    Specific Gravity, Urine 1.033 (*)    Bilirubin Urine SMALL (*)    Protein, ur 100 (*)    Leukocytes, UA SMALL (*)    All other components within  normal limits  URINE MICROSCOPIC-ADD ON - Abnormal; Notable for the following:    Squamous Epithelial / LPF FEW (*)    Bacteria, UA FEW (*)    Casts HYALINE CASTS (*)    All other components within normal limits  I-STAT CHEM 8, ED - Abnormal; Notable for the following:    Sodium 133 (*)    Creatinine, Ser 1.10 (*)    Glucose, Bld 132 (*)    Calcium, Ion 1.03 (*)    All other components within normal limits    Imaging Review Dg Chest 2 View  06/22/2014   CLINICAL DATA:  Chills for 3 days.  EXAM: CHEST  2 VIEW  COMPARISON:  08/23/2013  FINDINGS: There is a right jugular central line with tip in the SVC. There is mild unchanged right hemidiaphragm elevation. The lungs are clear. There are no effusions. Pulmonary vasculature is normal. Hilar, mediastinal and cardiac contours are unremarkable and unchanged.  IMPRESSION: No acute findings.   Electronically Signed   By:  Andreas Newport M.D.   On: 06/22/2014 03:47     EKG Interpretation None      MDM   Final diagnoses:  Chills  Nonintractable headache, unspecified chronicity pattern, unspecified headache type  Myalgia         Junius Creamer, NP 06/22/14 [place], NP 06/22/14 3818  Merryl Hacker, MD 06/22/14 4037

## 2014-06-25 ENCOUNTER — Telehealth: Payer: Self-pay | Admitting: *Deleted

## 2014-06-25 ENCOUNTER — Ambulatory Visit (HOSPITAL_COMMUNITY)
Admission: RE | Admit: 2014-06-25 | Discharge: 2014-06-25 | Disposition: A | Discharge status: Home / Self Care | Payer: BLUE CROSS/BLUE SHIELD | Source: Ambulatory Visit | Attending: Nurse Practitioner | Admitting: Nurse Practitioner

## 2014-06-25 ENCOUNTER — Other Ambulatory Visit (HOSPITAL_COMMUNITY): Payer: BLUE CROSS/BLUE SHIELD

## 2014-06-25 DIAGNOSIS — C50411 Malignant neoplasm of upper-outer quadrant of right female breast: Secondary | ICD-10-CM | POA: Insufficient documentation

## 2014-06-25 NOTE — Telephone Encounter (Signed)
Pt left a VM stating " I am calling to give an update like I was asked to do on Friday "  " I am feeling immensely better - fever is down and now I am just back to having the hot flashes like usual "  " headache is much better as is my appetite "  " I have been taking the antibiotic "  " just letting you guys know how I am doing "  This RN returned call and verified upcoming appointments.  No other needs at this time.

## 2014-06-25 NOTE — Progress Notes (Signed)
  Echocardiogram 2D Echocardiogram has been performed.  [name] 06/25/2014, 4:16 PM

## 2014-07-05 ENCOUNTER — Ambulatory Visit: Payer: BLUE CROSS/BLUE SHIELD

## 2014-07-05 ENCOUNTER — Other Ambulatory Visit (HOSPITAL_BASED_OUTPATIENT_CLINIC_OR_DEPARTMENT_OTHER): Payer: BLUE CROSS/BLUE SHIELD

## 2014-07-05 ENCOUNTER — Ambulatory Visit (HOSPITAL_BASED_OUTPATIENT_CLINIC_OR_DEPARTMENT_OTHER): Payer: BLUE CROSS/BLUE SHIELD

## 2014-07-05 ENCOUNTER — Other Ambulatory Visit: Payer: Self-pay

## 2014-07-05 ENCOUNTER — Other Ambulatory Visit: Payer: BLUE CROSS/BLUE SHIELD

## 2014-07-05 VITALS — BP 114/73 | HR 94 | Temp 98.2°F | Resp 18

## 2014-07-05 DIAGNOSIS — C50411 Malignant neoplasm of upper-outer quadrant of right female breast: Secondary | ICD-10-CM | POA: Diagnosis not present

## 2014-07-05 DIAGNOSIS — Z5112 Encounter for antineoplastic immunotherapy: Secondary | ICD-10-CM | POA: Diagnosis not present

## 2014-07-05 DIAGNOSIS — Z95828 Presence of other vascular implants and grafts: Secondary | ICD-10-CM

## 2014-07-05 LAB — COMPREHENSIVE METABOLIC PANEL (CC13)
ALT: 19 U/L (ref 0–55)
ANION GAP: 7 meq/L (ref 3–11)
AST: 16 U/L (ref 5–34)
Albumin: 3.4 g/dL — ABNORMAL LOW (ref 3.5–5.0)
Alkaline Phosphatase: 76 U/L (ref 40–150)
BUN: 15.8 mg/dL (ref 7.0–26.0)
CALCIUM: 8.8 mg/dL (ref 8.4–10.4)
CHLORIDE: 107 meq/L (ref 98–109)
CO2: 25 meq/L (ref 22–29)
Creatinine: 0.8 mg/dL (ref 0.6–1.1)
EGFR: >90 mL/min/{1.73_m2} (ref >90)
GLUCOSE: 82 mg/dL (ref 70–140)
POTASSIUM: 4.3 meq/L (ref 3.5–5.1)
Sodium: 139 mEq/L (ref 136–145)
Total Bilirubin: 0.27 mg/dL (ref 0.20–1.20)
Total Protein: 7.1 g/dL (ref 6.4–8.3)

## 2014-07-05 LAB — CBC WITH DIFFERENTIAL/PLATELET
BASO%: 1.1 % (ref 0.0–2.0)
Basophils Absolute: 0.1 10*3/uL (ref 0.0–0.1)
EOS%: 0.9 % (ref 0.0–7.0)
Eosinophils Absolute: 0.1 10*3/uL (ref 0.0–0.5)
HEMATOCRIT: 37.2 % (ref 34.8–46.6)
HGB: 11.8 g/dL (ref 11.6–15.9)
LYMPH#: 2.3 10*3/uL (ref 0.9–3.3)
LYMPH%: 33.6 % (ref 14.0–49.7)
MCH: 25.8 pg (ref 25.1–34.0)
MCHC: 31.8 g/dL (ref 31.5–36.0)
MCV: 81.2 fL (ref 79.5–101.0)
MONO#: 0.7 10*3/uL (ref 0.1–0.9)
MONO%: 9.4 % (ref 0.0–14.0)
NEUT#: 3.8 10*3/uL (ref 1.5–6.5)
NEUT%: 55 % (ref 38.4–76.8)
Platelets: 362 10*3/uL (ref 145–400)
RBC: 4.58 10*6/uL (ref 3.70–5.45)
RDW: 17.6 % — ABNORMAL HIGH (ref 11.2–14.5)
WBC: 7 10*3/uL (ref 3.9–10.3)

## 2014-07-05 MED ORDER — TRASTUZUMAB CHEMO INJECTION 440 MG
6.0000 mg/kg | Freq: Once | INTRAVENOUS | Status: AC
  Administered 2014-07-05: 840 mg via INTRAVENOUS
  Filled 2014-07-05: qty 40

## 2014-07-05 MED ORDER — SODIUM CHLORIDE 0.9 % IJ SOLN
10.0000 mL | INTRAMUSCULAR | Status: DC | PRN
  Administered 2014-07-05: 10 mL
  Filled 2014-07-05: qty 10

## 2014-07-05 MED ORDER — SODIUM CHLORIDE 0.9 % IV SOLN
Freq: Once | INTRAVENOUS | Status: AC
  Administered 2014-07-05: 15:00:00 via INTRAVENOUS

## 2014-07-05 MED ORDER — SODIUM CHLORIDE 0.9 % IJ SOLN
10.0000 mL | INTRAMUSCULAR | Status: DC | PRN
  Administered 2014-07-05: 10 mL via INTRAVENOUS
  Filled 2014-07-05: qty 10

## 2014-07-05 MED ORDER — HEPARIN SOD (PORK) LOCK FLUSH 100 UNIT/ML IV SOLN
500.0000 [IU] | Freq: Once | INTRAVENOUS | Status: AC | PRN
  Administered 2014-07-05: 500 [IU]
  Filled 2014-07-05: qty 5

## 2014-07-05 MED ORDER — DIPHENHYDRAMINE HCL 25 MG PO CAPS
ORAL_CAPSULE | ORAL | Status: AC
  Filled 2014-07-05: qty 1

## 2014-07-05 MED ORDER — ACETAMINOPHEN 325 MG PO TABS
ORAL_TABLET | ORAL | Status: AC
  Filled 2014-07-05: qty 2

## 2014-07-05 MED ORDER — ACETAMINOPHEN 160 MG/5ML PO SOLN
650.0000 mg | Freq: Once | ORAL | Status: AC
  Administered 2014-07-05: 650 mg via ORAL
  Filled 2014-07-05: qty 20.3

## 2014-07-05 MED ORDER — DIPHENHYDRAMINE HCL 25 MG PO CAPS
25.0000 mg | ORAL_CAPSULE | Freq: Once | ORAL | Status: AC
  Administered 2014-07-05: 25 mg via ORAL

## 2014-07-05 NOTE — Patient Instructions (Signed)

## 2014-07-05 NOTE — Patient Instructions (Signed)
[facility] Discharge Instructions for Patients Receiving Chemotherapy  Today you received the following chemotherapy agents: Herceptin   To help prevent nausea and vomiting after your treatment, we encourage you to take your nausea medication as directed.    If you develop nausea and vomiting that is not controlled by your nausea medication, call the clinic.   BELOW ARE SYMPTOMS THAT SHOULD BE REPORTED IMMEDIATELY:  *FEVER GREATER THAN 100.5 F  *CHILLS WITH OR WITHOUT FEVER  NAUSEA AND VOMITING THAT IS NOT CONTROLLED WITH YOUR NAUSEA MEDICATION  *UNUSUAL SHORTNESS OF BREATH  *UNUSUAL BRUISING OR BLEEDING  TENDERNESS IN MOUTH AND THROAT WITH OR WITHOUT PRESENCE OF ULCERS  *URINARY PROBLEMS  *BOWEL PROBLEMS  UNUSUAL RASH Items with * indicate a potential emergency and should be followed up as soon as possible.  Feel free to call the clinic you have any questions or concerns. The clinic phone number is [phone].  Please show the CHEMO ALERT CARD at check-in to the Emergency Department and triage nurse.   

## 2014-07-25 ENCOUNTER — Other Ambulatory Visit: Payer: Self-pay | Admitting: *Deleted

## 2014-07-25 DIAGNOSIS — C50411 Malignant neoplasm of upper-outer quadrant of right female breast: Secondary | ICD-10-CM

## 2014-07-26 ENCOUNTER — Ambulatory Visit: Payer: BLUE CROSS/BLUE SHIELD

## 2014-07-26 ENCOUNTER — Other Ambulatory Visit (HOSPITAL_BASED_OUTPATIENT_CLINIC_OR_DEPARTMENT_OTHER): Payer: BLUE CROSS/BLUE SHIELD

## 2014-07-26 ENCOUNTER — Ambulatory Visit (HOSPITAL_BASED_OUTPATIENT_CLINIC_OR_DEPARTMENT_OTHER): Payer: BLUE CROSS/BLUE SHIELD

## 2014-07-26 VITALS — BP 101/46 | HR 84 | Temp 98.3°F | Resp 18

## 2014-07-26 DIAGNOSIS — Z5111 Encounter for antineoplastic chemotherapy: Secondary | ICD-10-CM | POA: Diagnosis not present

## 2014-07-26 DIAGNOSIS — C50411 Malignant neoplasm of upper-outer quadrant of right female breast: Secondary | ICD-10-CM

## 2014-07-26 DIAGNOSIS — Z95828 Presence of other vascular implants and grafts: Secondary | ICD-10-CM

## 2014-07-26 LAB — CBC WITH DIFFERENTIAL/PLATELET
BASO%: 1 % (ref 0.0–2.0)
Basophils Absolute: 0.1 10*3/uL (ref 0.0–0.1)
EOS ABS: 0.1 10*3/uL (ref 0.0–0.5)
EOS%: 1.3 % (ref 0.0–7.0)
HCT: 37.2 % (ref 34.8–46.6)
HEMOGLOBIN: 11.9 g/dL (ref 11.6–15.9)
LYMPH#: 2.3 10*3/uL (ref 0.9–3.3)
LYMPH%: 35.8 % (ref 14.0–49.7)
MCH: 26 pg (ref 25.1–34.0)
MCHC: 31.9 g/dL (ref 31.5–36.0)
MCV: 81.7 fL (ref 79.5–101.0)
MONO#: 0.4 10*3/uL (ref 0.1–0.9)
MONO%: 6.3 % (ref 0.0–14.0)
NEUT#: 3.5 10*3/uL (ref 1.5–6.5)
NEUT%: 55.6 % (ref 38.4–76.8)
Platelets: 303 10*3/uL (ref 145–400)
RBC: 4.56 10*6/uL (ref 3.70–5.45)
RDW: 17.8 % — ABNORMAL HIGH (ref 11.2–14.5)
WBC: 6.3 10*3/uL (ref 3.9–10.3)

## 2014-07-26 LAB — COMPREHENSIVE METABOLIC PANEL (CC13)
ALBUMIN: 3.4 g/dL — AB (ref 3.5–5.0)
ALT: 14 U/L (ref 0–55)
AST: 17 U/L (ref 5–34)
Alkaline Phosphatase: 79 U/L (ref 40–150)
Anion Gap: 9 mEq/L (ref 3–11)
BUN: 16.3 mg/dL (ref 7.0–26.0)
CALCIUM: 9.3 mg/dL (ref 8.4–10.4)
CHLORIDE: 109 meq/L (ref 98–109)
CO2: 24 meq/L (ref 22–29)
CREATININE: 0.9 mg/dL (ref 0.6–1.1)
EGFR: 88 mL/min/{1.73_m2} — ABNORMAL LOW (ref >90)
GLUCOSE: 87 mg/dL (ref 70–140)
Potassium: 4.4 mEq/L (ref 3.5–5.1)
SODIUM: 143 meq/L (ref 136–145)
TOTAL PROTEIN: 7.3 g/dL (ref 6.4–8.3)
Total Bilirubin: <0.2 mg/dL (ref 0.20–1.20)

## 2014-07-26 MED ORDER — HEPARIN SOD (PORK) LOCK FLUSH 100 UNIT/ML IV SOLN
500.0000 [IU] | Freq: Once | INTRAVENOUS | Status: AC | PRN
  Administered 2014-07-26: 500 [IU]
  Filled 2014-07-26: qty 5

## 2014-07-26 MED ORDER — ACETAMINOPHEN 160 MG/5ML PO SOLN
650.0000 mg | Freq: Once | ORAL | Status: AC
  Administered 2014-07-26: 650 mg via ORAL
  Filled 2014-07-26: qty 20.3

## 2014-07-26 MED ORDER — SODIUM CHLORIDE 0.9 % IV SOLN
Freq: Once | INTRAVENOUS | Status: AC
  Administered 2014-07-26: 13:00:00 via INTRAVENOUS

## 2014-07-26 MED ORDER — DIPHENHYDRAMINE HCL 25 MG PO CAPS
ORAL_CAPSULE | ORAL | Status: AC
  Filled 2014-07-26: qty 2

## 2014-07-26 MED ORDER — TRASTUZUMAB CHEMO INJECTION 440 MG
6.0000 mg/kg | Freq: Once | INTRAVENOUS | Status: AC
  Administered 2014-07-26: 840 mg via INTRAVENOUS
  Filled 2014-07-26: qty 40

## 2014-07-26 MED ORDER — DIPHENHYDRAMINE HCL 25 MG PO CAPS
25.0000 mg | ORAL_CAPSULE | Freq: Once | ORAL | Status: AC
  Administered 2014-07-26: 25 mg via ORAL

## 2014-07-26 MED ORDER — SODIUM CHLORIDE 0.9 % IJ SOLN
10.0000 mL | INTRAMUSCULAR | Status: DC | PRN
  Administered 2014-07-26: 10 mL via INTRAVENOUS
  Filled 2014-07-26: qty 10

## 2014-07-26 MED ORDER — SODIUM CHLORIDE 0.9 % IJ SOLN
10.0000 mL | INTRAMUSCULAR | Status: DC | PRN
  Administered 2014-07-26: 10 mL
  Filled 2014-07-26: qty 10

## 2014-07-26 NOTE — Patient Instructions (Signed)

## 2014-07-26 NOTE — Patient Instructions (Signed)
[facility] Discharge Instructions for Patients Receiving Chemotherapy  Today you received the following chemotherapy agents Herceotin  To help prevent nausea and vomiting after your treatment, we encourage you to take your nausea medication     If you develop nausea and vomiting that is not controlled by your nausea medication, call the clinic.   BELOW ARE SYMPTOMS THAT SHOULD BE REPORTED IMMEDIATELY:  *FEVER GREATER THAN 100.5 F  *CHILLS WITH OR WITHOUT FEVER  NAUSEA AND VOMITING THAT IS NOT CONTROLLED WITH YOUR NAUSEA MEDICATION  *UNUSUAL SHORTNESS OF BREATH  *UNUSUAL BRUISING OR BLEEDING  TENDERNESS IN MOUTH AND THROAT WITH OR WITHOUT PRESENCE OF ULCERS  *URINARY PROBLEMS  *BOWEL PROBLEMS  UNUSUAL RASH Items with * indicate a potential emergency and should be followed up as soon as possible.  Feel free to call the clinic you have any questions or concerns. The clinic phone number is [phone].  Please show the [facility] at check-in to the Emergency Department and triage nurse.

## 2014-08-15 ENCOUNTER — Other Ambulatory Visit: Payer: Self-pay | Admitting: *Deleted

## 2014-08-15 DIAGNOSIS — C50411 Malignant neoplasm of upper-outer quadrant of right female breast: Secondary | ICD-10-CM

## 2014-08-16 ENCOUNTER — Ambulatory Visit: Payer: BLUE CROSS/BLUE SHIELD

## 2014-08-16 ENCOUNTER — Other Ambulatory Visit (HOSPITAL_BASED_OUTPATIENT_CLINIC_OR_DEPARTMENT_OTHER): Payer: BLUE CROSS/BLUE SHIELD

## 2014-08-16 ENCOUNTER — Ambulatory Visit (HOSPITAL_BASED_OUTPATIENT_CLINIC_OR_DEPARTMENT_OTHER): Payer: BLUE CROSS/BLUE SHIELD | Admitting: Nurse Practitioner

## 2014-08-16 ENCOUNTER — Encounter: Payer: Self-pay | Admitting: Nurse Practitioner

## 2014-08-16 ENCOUNTER — Telehealth: Payer: Self-pay | Admitting: Nurse Practitioner

## 2014-08-16 ENCOUNTER — Ambulatory Visit (HOSPITAL_BASED_OUTPATIENT_CLINIC_OR_DEPARTMENT_OTHER): Payer: BLUE CROSS/BLUE SHIELD

## 2014-08-16 ENCOUNTER — Other Ambulatory Visit: Payer: BLUE CROSS/BLUE SHIELD

## 2014-08-16 VITALS — BP 135/70 | HR 46 | Temp 99.1°F | Resp 18 | Ht 65.0 in | Wt 318.9 lb

## 2014-08-16 DIAGNOSIS — Z5112 Encounter for antineoplastic immunotherapy: Secondary | ICD-10-CM | POA: Diagnosis not present

## 2014-08-16 DIAGNOSIS — C50411 Malignant neoplasm of upper-outer quadrant of right female breast: Secondary | ICD-10-CM | POA: Diagnosis not present

## 2014-08-16 DIAGNOSIS — N951 Menopausal and female climacteric states: Secondary | ICD-10-CM

## 2014-08-16 DIAGNOSIS — R232 Flushing: Secondary | ICD-10-CM

## 2014-08-16 DIAGNOSIS — Z95828 Presence of other vascular implants and grafts: Secondary | ICD-10-CM

## 2014-08-16 DIAGNOSIS — Z170 Estrogen receptor positive status [ER+]: Secondary | ICD-10-CM | POA: Diagnosis not present

## 2014-08-16 LAB — CBC WITH DIFFERENTIAL/PLATELET
BASO%: 0.5 % (ref 0.0–2.0)
BASOS ABS: 0 10*3/uL (ref 0.0–0.1)
EOS%: 0.9 % (ref 0.0–7.0)
Eosinophils Absolute: 0.1 10*3/uL (ref 0.0–0.5)
HEMATOCRIT: 36.7 % (ref 34.8–46.6)
HGB: 12 g/dL (ref 11.6–15.9)
LYMPH%: 30.3 % (ref 14.0–49.7)
MCH: 27 pg (ref 25.1–34.0)
MCHC: 32.7 g/dL (ref 31.5–36.0)
MCV: 82.5 fL (ref 79.5–101.0)
MONO#: 0.5 10*3/uL (ref 0.1–0.9)
MONO%: 7.1 % (ref 0.0–14.0)
NEUT%: 61.2 % (ref 38.4–76.8)
NEUTROS ABS: 4.1 10*3/uL (ref 1.5–6.5)
PLATELETS: 239 10*3/uL (ref 145–400)
RBC: 4.45 10*6/uL (ref 3.70–5.45)
RDW: 17.5 % — ABNORMAL HIGH (ref 11.2–14.5)
WBC: 6.7 10*3/uL (ref 3.9–10.3)
lymph#: 2 10*3/uL (ref 0.9–3.3)

## 2014-08-16 LAB — COMPREHENSIVE METABOLIC PANEL (CC13)
ALK PHOS: 73 U/L (ref 40–150)
ALT: 19 U/L (ref 0–55)
AST: 16 U/L (ref 5–34)
Albumin: 3.4 g/dL — ABNORMAL LOW (ref 3.5–5.0)
Anion Gap: 9 mEq/L (ref 3–11)
BUN: 15.4 mg/dL (ref 7.0–26.0)
CO2: 25 meq/L (ref 22–29)
CREATININE: 0.8 mg/dL (ref 0.6–1.1)
Calcium: 9.5 mg/dL (ref 8.4–10.4)
Chloride: 108 mEq/L (ref 98–109)
EGFR: >90 mL/min/{1.73_m2} (ref >90)
Glucose: 99 mg/dl (ref 70–140)
POTASSIUM: 4.2 meq/L (ref 3.5–5.1)
Sodium: 142 mEq/L (ref 136–145)
TOTAL PROTEIN: 7.1 g/dL (ref 6.4–8.3)
Total Bilirubin: 0.24 mg/dL (ref 0.20–1.20)

## 2014-08-16 MED ORDER — SODIUM CHLORIDE 0.9 % IV SOLN
Freq: Once | INTRAVENOUS | Status: AC
  Administered 2014-08-16: 13:00:00 via INTRAVENOUS

## 2014-08-16 MED ORDER — SODIUM CHLORIDE 0.9 % IJ SOLN
10.0000 mL | INTRAMUSCULAR | Status: DC | PRN
  Administered 2014-08-16: 10 mL via INTRAVENOUS
  Filled 2014-08-16: qty 10

## 2014-08-16 MED ORDER — ACETAMINOPHEN 325 MG PO TABS
ORAL_TABLET | ORAL | Status: AC
  Filled 2014-08-16: qty 2

## 2014-08-16 MED ORDER — SODIUM CHLORIDE 0.9 % IJ SOLN
10.0000 mL | INTRAMUSCULAR | Status: DC | PRN
  Administered 2014-08-16: 10 mL
  Filled 2014-08-16: qty 10

## 2014-08-16 MED ORDER — ACETAMINOPHEN 160 MG/5ML PO SOLN
650.0000 mg | Freq: Once | ORAL | Status: AC
  Administered 2014-08-16: 650 mg via ORAL
  Filled 2014-08-16: qty 20.3

## 2014-08-16 MED ORDER — DIPHENHYDRAMINE HCL 25 MG PO CAPS
25.0000 mg | ORAL_CAPSULE | Freq: Once | ORAL | Status: AC
  Administered 2014-08-16: 25 mg via ORAL

## 2014-08-16 MED ORDER — HEPARIN SOD (PORK) LOCK FLUSH 100 UNIT/ML IV SOLN
500.0000 [IU] | Freq: Once | INTRAVENOUS | Status: AC | PRN
  Administered 2014-08-16: 500 [IU]
  Filled 2014-08-16: qty 5

## 2014-08-16 MED ORDER — DIPHENHYDRAMINE HCL 25 MG PO CAPS
ORAL_CAPSULE | ORAL | Status: AC
  Filled 2014-08-16: qty 1

## 2014-08-16 MED ORDER — SODIUM CHLORIDE 0.9 % IV SOLN
6.0000 mg/kg | Freq: Once | INTRAVENOUS | Status: AC
  Administered 2014-08-16: 840 mg via INTRAVENOUS
  Filled 2014-08-16: qty 40

## 2014-08-16 MED ORDER — GABAPENTIN 100 MG PO CAPS: 100.0000 mg | ORAL_CAPSULE | Freq: Every day | ORAL | Status: DC

## 2014-08-16 NOTE — Patient Instructions (Signed)
[facility] Discharge Instructions for Patients Receiving Chemotherapy  Today you received the following chemotherapy agents Herceptin.  To help prevent nausea and vomiting after your treatment, we encourage you to take your nausea medication as prescribed by your physician. If you develop nausea and vomiting that is not controlled by your nausea medication, call the clinic.   BELOW ARE SYMPTOMS THAT SHOULD BE REPORTED IMMEDIATELY:  *FEVER GREATER THAN 100.5 F  *CHILLS WITH OR WITHOUT FEVER  NAUSEA AND VOMITING THAT IS NOT CONTROLLED WITH YOUR NAUSEA MEDICATION  *UNUSUAL SHORTNESS OF BREATH  *UNUSUAL BRUISING OR BLEEDING  TENDERNESS IN MOUTH AND THROAT WITH OR WITHOUT PRESENCE OF ULCERS  *URINARY PROBLEMS  *BOWEL PROBLEMS  UNUSUAL RASH Items with * indicate a potential emergency and should be followed up as soon as possible.  Feel free to call the clinic you have any questions or concerns. The clinic phone number is [phone].  Please show the CHEMO ALERT CARD at check-in to the Emergency Department and triage nurse.   

## 2014-08-16 NOTE — Patient Instructions (Signed)

## 2014-08-16 NOTE — Telephone Encounter (Signed)
Appointments made and avs will be printed in chemo  °

## 2014-08-16 NOTE — Progress Notes (Signed)
[facility]  [phone] [phone]     ID: [name] DOB: Jan 16, 1970  MR#: [id number]  UVO#:[id number]  Patient Care Team: Deland Pretty, MD as PCP - General (Internal Medicine) Erroll Luna, MD as Consulting Physician (General Surgery) Chauncey Cruel, MD as Consulting Physician (Oncology) Everlene Farrier, MD as Consulting Physician (Obstetrics and Gynecology)  CHIEF COMPLAINT:  triple positive breast cancer  CURRENT TREATMENT:  Trastuzumab  BREAST CANCER HISTORY: From the original intake note:  The patient herself palpated a mass in her right breast mid July. She brought it to Dr. Sherlynn Stalls attention and he set the patient up for bilateral diagnostic mammography and right breast ultrasonography of the breast Center 07/28/2013. Right mammogram showed an irregular mass in the upper outer quadrant measuring 1.4 cm. There was some associated pleomorphic microcalcifications. This was palpable, at the 10:30 position 11 cm from the nipple. By palpation it measured approximately 1.5 cm. Ultrasound confirmed an hypoechoic mass with irregular borders measuring 1.1 cm. The right axilla showed a few adjacent deep lymph nodes with mild cortical thickening.  On 08/08/2013 the patient underwent biopsy of the breast mass in question. The pathology (SAA [id number]) showed an invasive ductal carcinoma, grade 2, estrogen receptor 49% positive, progesterone receptor 47% positive, both with moderate staining intensity, with an MIB-1 of 52%, and HER-2 amplification with a signals ratio of 8.1. The right axillary lymph node biopsied at the same time was benign this was felt to be possibly concordant.  And 08/15/2013 the patient underwent bilateral breast MRI showing a 2 cm irregular enhancing mass in the upper outer quadrant of the right breast. There were no additional changes no abnormal appearing lymph nodes and no masses or abnormalities in the left breast.  The  patient's subsequent history is as detailed below  INTERVAL HISTORY: [name] returns today for follow up of her breast cancer. She is due for trastuzumab and tolerates this drug well with no side effect that she is aware of. She continues on the tamoxifen and she complains of hot flashes and vaginal dryness. She was started on 34m gabapentin QHS during her last visit, but this causes drowsiness well after she wakes up. The interval history is otherwise unremarkable.  REVIEW OF SYSTEMS: RJesycadenies fevers, chills, nausea, vomiting, or changes in bowel or bladder habits. She is eating and drinking well. She hasn't started exercising yet. She has some mild joint stiffness. She has chronic bilateral lower extremity edema, which has improved some but maybe not so much today because she hasn't been elevating her feet. She has reduced her sodium intake. She does not sleep well at night on days she does not take the gabapentin. She denies shortness of breath, chest pain, cough, or palpitations. A detailed review of systems is otherwise stable.  PAST MEDICAL HISTORY: Past Medical History  Diagnosis Date  . Anxiety   . Wears contact lenses   . Snores   . Malignant neoplasm of breast (female), unspecified site   . S/P radiation therapy 03/19/2014 through 05/03/2014     Right breast 4500 cGy in 25 sessions, right breast boost 1600 cGy in 8 sessions     PAST SURGICAL HISTORY: Past Surgical History  Procedure Laterality Date  . Wisdom tooth extraction    . Portacath placement N/A 08/23/2013    Procedure: INSERTION PORT-A-CATH WITH ULTRA SOUND;  Surgeon: TJoyice Faster Cornett, MD;  Location: [facility]  Service: General;  Laterality: N/A;  . Breast lumpectomy with  radioactive seed localization Right 01/25/2014    Procedure: RIGHT BREAST LUMPECTOMY WITH RADIOACTIVE SEED LOCALIZATION ;  Surgeon: Erroll Luna, MD;  Location:  [facility];  Service: General;  Laterality: Right;  . Axillary sentinel node biopsy Right 01/25/2014    Procedure: RIGHT SENTINEL LYMPH NODE MAPPING;  Surgeon: Erroll Luna, MD;  Location: [facility];  Service: General;  Laterality: Right;    FAMILY HISTORY Family History  Problem Relation Age of Onset  . Uterine cancer Mother 56    TAH/BSO  . Colon cancer Father 62    deceased 93  . Leukemia Maternal Grandmother     deceased 28  . Skin cancer Paternal Grandmother   . Prostate cancer Other    the patient's father died from colon cancer the age of 73. It had been diagnosed 3 years prior. The patient's mother was diagnosed with cancer of the uterus at age 56. She is 45 years old as of August 2015. The patient had no brothers or sisters. There is a history of prostate cancer leukemia at in the family as well as melanoma, but there is no history of breast or ovarian cancer in the family.  GYNECOLOGIC HISTORY:  No LMP recorded. Patient is not currently having periods (Reason: Chemotherapy). Menarche age 32. The patient is GX P0. She used oral contraceptives for several years remotely with no complications. She stopped having periods after first cycle of chemotherapy.  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 She lives alone, with no pets.    ADVANCED DIRECTIVES: Not in place. At her 08/16/2013 visit the patient was given the upper. Documents 2 complete and notarize so she may name her mother, [name] focal as her healthcare power of attorney, which is the patient's intention. Ms. focal can be reached at [phone]. She currently lives in [place] but is planning to move to [place]: History  Substance Use Topics  . Smoking status: Former Smoker    Quit date: 02/20/2013  . Smokeless tobacco: Not on file  . Alcohol Use: Yes     Comment: 1/week     Colonoscopy: Never  PAP: July  2015 Bone density: Never  Lipid panel:  Allergies  Allergen Reactions  . Amoxicillin Rash  . Chocolate Rash    Only Hershey's brand    Current Outpatient Prescriptions  Medication Sig Dispense Refill  . Calcium-Vitamin D-Vitamin K (CALCIUM SOFT CHEWS PO) Take 1 tablet by mouth daily.     Marland Kitchen emollient (BIAFINE) cream Apply 1 application topically daily.     Marland Kitchen lidocaine-prilocaine (EMLA) cream Apply 1 application topically as needed. (Patient taking differently: Apply 1 application topically as needed (for port). ) 30 g 0  . non-metallic deodorant (ALRA) MISC Apply 1 application topically.    . tamoxifen (NOLVADEX) 20 MG tablet Take 20 mg by mouth daily.    Marland Kitchen gabapentin (NEURONTIN) 100 MG capsule Take 1 capsule (100 mg total) by mouth at bedtime. Take 1-3 capsules nightly for hotflashes 90 capsule 5  . Multiple Vitamins-Minerals (MULTI ADULT GUMMIES PO) Take 3-4 each by mouth daily.     . nitrofurantoin, macrocrystal-monohydrate, (MACROBID) 100 MG capsule Take 1 capsule (100 mg total) by mouth at bedtime. Take first tablet ASAP and may repeat this pm. (Patient not taking: Reported on 08/16/2014) 10 capsule 0   No current facility-administered medications for this visit.   Facility-Administered Medications Ordered in Other Visits  Medication Dose Route Frequency Provider Last Rate Last Dose  . heparin lock flush 100 unit/mL  500 Units Intracatheter Once PRN Chauncey Cruel, MD      . sodium chloride 0.9 % injection 10 mL  10 mL Intracatheter PRN Chauncey Cruel, MD      . trastuzumab (HERCEPTIN) 840 mg in sodium chloride 0.9 % 250 mL chemo infusion  6 mg/kg (Treatment Plan Actual) Intravenous Once Chauncey Cruel, MD 580 mL/hr at 08/16/14 1249 840 mg at 08/16/14 1249    OBJECTIVE: Middle-aged [place] American woman who appears stated age 59 Vitals:   08/16/14 1152  BP: 135/70  Pulse: 46  Temp: 99.1 F (37.3 C)  Resp: 18     Body mass index is 53.07 kg/(m^2).     ECOG FS:1 -  Symptomatic but completely ambulatory   Sclerae unicteric, pupils round and equal Oropharynx clear and moist-- no thrush or other lesions No cervical or supraclavicular adenopathy Lungs no rales or rhonchi Heart regular rate and rhythm Abd soft, nontender, positive bowel sounds MSK no focal spinal tenderness, chronic bilateral +2 edema Neuro: nonfocal, well oriented, appropriate affect Breasts: right breast status post lumpectomy and radiation. No evidence of recurrent disease. Right axilla benign. Left breast unremarkable.  LAB RESULTS:  CMP     Component Value Date/Time   NA 142 08/16/2014 1056   NA 133* 06/22/2014 0017   K 4.2 08/16/2014 1056   K 4.1 06/22/2014 0017   CL 102 06/22/2014 0017   CO2 25 08/16/2014 1056   GLUCOSE 99 08/16/2014 1056   GLUCOSE 132* 06/22/2014 0017   BUN 15.4 08/16/2014 1056   BUN 16 06/22/2014 0017   CREATININE 0.8 08/16/2014 1056   CREATININE 1.10* 06/22/2014 0017   CALCIUM 9.5 08/16/2014 1056   PROT 7.1 08/16/2014 1056   ALBUMIN 3.4* 08/16/2014 1056   AST 16 08/16/2014 1056   ALT 19 08/16/2014 1056   ALKPHOS 73 08/16/2014 1056   BILITOT 0.24 08/16/2014 1056    I No results found for: SPEP  Lab Results  Component Value Date   WBC 6.7 08/16/2014   NEUTROABS 4.1 08/16/2014   HGB 12.0 08/16/2014   HCT 36.7 08/16/2014   MCV 82.5 08/16/2014   PLT 239 08/16/2014      Chemistry      Component Value Date/Time   NA 142 08/16/2014 1056   NA 133* 06/22/2014 0017   K 4.2 08/16/2014 1056   K 4.1 06/22/2014 0017   CL 102 06/22/2014 0017   CO2 25 08/16/2014 1056   BUN 15.4 08/16/2014 1056   BUN 16 06/22/2014 0017   CREATININE 0.8 08/16/2014 1056   CREATININE 1.10* 06/22/2014 0017      Component Value Date/Time   CALCIUM 9.5 08/16/2014 1056   ALKPHOS 73 08/16/2014 1056   AST 16 08/16/2014 1056   ALT 19 08/16/2014 1056   BILITOT 0.24 08/16/2014 1056       No results found for: LABCA2  No components found for: LABCA125  No  results for input(s): INR in the last 168 hours.  Urinalysis    Component Value Date/Time   COLORURINE ORANGE* 06/22/2014 0019    STUDIES: No results found.  ASSESSMENT: 44 y.o. BRCA negative [name] woman status post right breast upper outer quadrant biopsy 08/08/2013 for a triple positive invasive ductal carcinoma, grade 2, with an MIB-1 of 52%, and a suspicious lymph node biopsied at the same time pathologically benign.  (1) genetics testing found no mutations  in ATM, BARD1, BRCA1, BRCA2, BRIP1, CDH1, CHEK2, MRE11A, MUTYH, NBN, NF1, PALB2, PTEN, RAD50, RAD51C, RAD51D, and TP53  (2) neoadjuvant chemotherapy consisted of carboplatin, docetaxel, trastuzumab and pertuzumab for 6 cycles, completed 12/21/2013  (a) pertuzumab discontinued after cycle 1 because of severe diarrhea  (b) carboplatin switched for cyclophosphamide because of hypersensitivity reaction on 11/20/13  (c) docetaxel removed from final cycle because of peripheral neuropathy symptoms.  (3) trastuzumab to continue to complete one year (through August 2016)  (a) repeat echocardiogram 06/25/2014 shows a well preserved ejection fraction  (4) status post right lumpectomy and sentinel lymph node sampling 01/25/2014 showing a residual pT1b pN0 invasive ductal carcinoma, grade 2, again HER-2 amplified  (5) adjuvant radiation completed 05/03/2014  (6) anti-estrogens to follow radiation.  PLAN: [name] looks and feels great today. The labs were reviewed in detail and were entirely stable. She will proceed with her next trastuzumab as planned today. Her most recent echocardiogram showed a well preserved ejection fraction.  Because the gabapentin works, but cause immense drowsiness, we are reducing her dose to 148m nightly. She can titrate up to 2 pills if she feels it necessary.   She will complete her last trastuzumab infusion in 3 weeks. I am sending Dr. CBrantley Stagea staff message alerting him that after this date she may have  her port removed.  RMikennawill return in 3 months for labs and a follow up visit. She understands and agrees with this plan. She knows the goal of treatment in her case is cure. She has been encouraged to call with any issues that might arise before her next visit here.    HLaurie Panda NP 08/16/2014 1:03 PM

## 2014-09-06 ENCOUNTER — Other Ambulatory Visit: Payer: Self-pay | Admitting: Physician Assistant

## 2014-09-06 ENCOUNTER — Ambulatory Visit: Payer: BLUE CROSS/BLUE SHIELD

## 2014-09-06 ENCOUNTER — Other Ambulatory Visit (HOSPITAL_BASED_OUTPATIENT_CLINIC_OR_DEPARTMENT_OTHER): Payer: BLUE CROSS/BLUE SHIELD

## 2014-09-06 ENCOUNTER — Ambulatory Visit (HOSPITAL_BASED_OUTPATIENT_CLINIC_OR_DEPARTMENT_OTHER): Payer: BLUE CROSS/BLUE SHIELD

## 2014-09-06 VITALS — BP 119/70 | HR 79 | Temp 98.2°F

## 2014-09-06 DIAGNOSIS — C50411 Malignant neoplasm of upper-outer quadrant of right female breast: Secondary | ICD-10-CM

## 2014-09-06 DIAGNOSIS — Z5112 Encounter for antineoplastic immunotherapy: Secondary | ICD-10-CM

## 2014-09-06 DIAGNOSIS — Z95828 Presence of other vascular implants and grafts: Secondary | ICD-10-CM

## 2014-09-06 LAB — COMPREHENSIVE METABOLIC PANEL (CC13)
ALBUMIN: 3.3 g/dL — AB (ref 3.5–5.0)
ALT: 20 U/L (ref 0–55)
AST: 19 U/L (ref 5–34)
Alkaline Phosphatase: 78 U/L (ref 40–150)
Anion Gap: 8 mEq/L (ref 3–11)
BUN: 13.4 mg/dL (ref 7.0–26.0)
CALCIUM: 9 mg/dL (ref 8.4–10.4)
CHLORIDE: 106 meq/L (ref 98–109)
CO2: 26 mEq/L (ref 22–29)
Creatinine: 0.8 mg/dL (ref 0.6–1.1)
EGFR: >90 mL/min/{1.73_m2} (ref >90)
Glucose: 113 mg/dl (ref 70–140)
POTASSIUM: 4.2 meq/L (ref 3.5–5.1)
Sodium: 140 mEq/L (ref 136–145)
TOTAL PROTEIN: 6.9 g/dL (ref 6.4–8.3)
Total Bilirubin: 0.21 mg/dL (ref 0.20–1.20)

## 2014-09-06 LAB — CBC WITH DIFFERENTIAL/PLATELET
BASO%: 0.9 % (ref 0.0–2.0)
Basophils Absolute: 0 10*3/uL (ref 0.0–0.1)
EOS ABS: 0.1 10*3/uL (ref 0.0–0.5)
EOS%: 1.2 % (ref 0.0–7.0)
HCT: 36.2 % (ref 34.8–46.6)
HEMOGLOBIN: 11.8 g/dL (ref 11.6–15.9)
LYMPH#: 1.8 10*3/uL (ref 0.9–3.3)
LYMPH%: 30.5 % (ref 14.0–49.7)
MCH: 27 pg (ref 25.1–34.0)
MCHC: 32.6 g/dL (ref 31.5–36.0)
MCV: 82.8 fL (ref 79.5–101.0)
MONO#: 0.4 10*3/uL (ref 0.1–0.9)
MONO%: 7.2 % (ref 0.0–14.0)
NEUT%: 60.2 % (ref 38.4–76.8)
NEUTROS ABS: 3.5 10*3/uL (ref 1.5–6.5)
PLATELETS: 295 10*3/uL (ref 145–400)
RBC: 4.37 10*6/uL (ref 3.70–5.45)
RDW: 18 % — AB (ref 11.2–14.5)
WBC: 5.8 10*3/uL (ref 3.9–10.3)

## 2014-09-06 MED ORDER — DIPHENHYDRAMINE HCL 25 MG PO CAPS
25.0000 mg | ORAL_CAPSULE | Freq: Once | ORAL | Status: AC
  Administered 2014-09-06: 25 mg via ORAL

## 2014-09-06 MED ORDER — SODIUM CHLORIDE 0.9 % IJ SOLN
10.0000 mL | INTRAMUSCULAR | Status: DC | PRN
  Administered 2014-09-06: 10 mL via INTRAVENOUS
  Filled 2014-09-06: qty 10

## 2014-09-06 MED ORDER — HEPARIN SOD (PORK) LOCK FLUSH 100 UNIT/ML IV SOLN
500.0000 [IU] | Freq: Once | INTRAVENOUS | Status: AC | PRN
  Administered 2014-09-06: 500 [IU]
  Filled 2014-09-06: qty 5

## 2014-09-06 MED ORDER — SODIUM CHLORIDE 0.9 % IV SOLN
Freq: Once | INTRAVENOUS | Status: AC
  Administered 2014-09-06: 13:00:00 via INTRAVENOUS

## 2014-09-06 MED ORDER — SODIUM CHLORIDE 0.9 % IJ SOLN
10.0000 mL | INTRAMUSCULAR | Status: DC | PRN
  Administered 2014-09-06: 10 mL
  Filled 2014-09-06: qty 10

## 2014-09-06 MED ORDER — TRASTUZUMAB CHEMO INJECTION 440 MG
6.0000 mg/kg | Freq: Once | INTRAVENOUS | Status: AC
  Administered 2014-09-06: 840 mg via INTRAVENOUS
  Filled 2014-09-06: qty 40

## 2014-09-06 MED ORDER — ACETAMINOPHEN 160 MG/5ML PO SOLN
650.0000 mg | Freq: Once | ORAL | Status: AC
  Administered 2014-09-06: 650 mg via ORAL
  Filled 2014-09-06: qty 20.3

## 2014-09-06 MED ORDER — DIPHENHYDRAMINE HCL 25 MG PO CAPS
ORAL_CAPSULE | ORAL | Status: AC
  Filled 2014-09-06: qty 1

## 2014-09-06 NOTE — Patient Instructions (Signed)
[facility] Discharge Instructions for Patients Receiving Chemotherapy  Today you received the following chemotherapy agents:  Herceptin  To help prevent nausea and vomiting after your treatment, we encourage you to take your nausea medication as prescribed.   If you develop nausea and vomiting that is not controlled by your nausea medication, call the clinic.   BELOW ARE SYMPTOMS THAT SHOULD BE REPORTED IMMEDIATELY:  *FEVER GREATER THAN 100.5 F  *CHILLS WITH OR WITHOUT FEVER  NAUSEA AND VOMITING THAT IS NOT CONTROLLED WITH YOUR NAUSEA MEDICATION  *UNUSUAL SHORTNESS OF BREATH  *UNUSUAL BRUISING OR BLEEDING  TENDERNESS IN MOUTH AND THROAT WITH OR WITHOUT PRESENCE OF ULCERS  *URINARY PROBLEMS  *BOWEL PROBLEMS  UNUSUAL RASH Items with * indicate a potential emergency and should be followed up as soon as possible.  Feel free to call the clinic you have any questions or concerns. The clinic phone number is [phone].  Please show the CHEMO ALERT CARD at check-in to the Emergency Department and triage nurse.   

## 2014-11-16 ENCOUNTER — Encounter: Payer: Self-pay | Admitting: Genetic Counselor

## 2014-11-16 DIAGNOSIS — Z1379 Encounter for other screening for genetic and chromosomal anomalies: Secondary | ICD-10-CM | POA: Insufficient documentation

## 2014-11-30 ENCOUNTER — Other Ambulatory Visit: Payer: Self-pay | Admitting: *Deleted

## 2014-11-30 DIAGNOSIS — C50411 Malignant neoplasm of upper-outer quadrant of right female breast: Secondary | ICD-10-CM

## 2014-12-03 ENCOUNTER — Other Ambulatory Visit: Payer: BLUE CROSS/BLUE SHIELD

## 2014-12-03 ENCOUNTER — Ambulatory Visit (HOSPITAL_BASED_OUTPATIENT_CLINIC_OR_DEPARTMENT_OTHER): Payer: BLUE CROSS/BLUE SHIELD

## 2014-12-03 ENCOUNTER — Ambulatory Visit (HOSPITAL_BASED_OUTPATIENT_CLINIC_OR_DEPARTMENT_OTHER): Payer: BLUE CROSS/BLUE SHIELD | Admitting: Nurse Practitioner

## 2014-12-03 ENCOUNTER — Other Ambulatory Visit (HOSPITAL_BASED_OUTPATIENT_CLINIC_OR_DEPARTMENT_OTHER): Payer: BLUE CROSS/BLUE SHIELD

## 2014-12-03 VITALS — BP 138/92 | HR 101 | Temp 97.7°F | Resp 19 | Ht 65.0 in | Wt 328.3 lb

## 2014-12-03 DIAGNOSIS — C50411 Malignant neoplasm of upper-outer quadrant of right female breast: Secondary | ICD-10-CM

## 2014-12-03 DIAGNOSIS — Z95828 Presence of other vascular implants and grafts: Secondary | ICD-10-CM

## 2014-12-03 LAB — CBC WITH DIFFERENTIAL/PLATELET
BASO%: 0.8 % (ref 0.0–2.0)
BASOS ABS: 0.1 10*3/uL (ref 0.0–0.1)
EOS ABS: 0 10*3/uL (ref 0.0–0.5)
EOS%: 0.5 % (ref 0.0–7.0)
HEMATOCRIT: 37 % (ref 34.8–46.6)
HEMOGLOBIN: 11.7 g/dL (ref 11.6–15.9)
LYMPH%: 32.8 % (ref 14.0–49.7)
MCH: 26.8 pg (ref 25.1–34.0)
MCHC: 31.6 g/dL (ref 31.5–36.0)
MCV: 84.9 fL (ref 79.5–101.0)
MONO#: 0.5 10*3/uL (ref 0.1–0.9)
MONO%: 7.4 % (ref 0.0–14.0)
NEUT%: 58.5 % (ref 38.4–76.8)
NEUTROS ABS: 4 10*3/uL (ref 1.5–6.5)
PLATELETS: 261 10*3/uL (ref 145–400)
RBC: 4.36 10*6/uL (ref 3.70–5.45)
RDW: 15.4 % — ABNORMAL HIGH (ref 11.2–14.5)
WBC: 6.8 10*3/uL (ref 3.9–10.3)
lymph#: 2.2 10*3/uL (ref 0.9–3.3)

## 2014-12-03 LAB — COMPREHENSIVE METABOLIC PANEL (CC13)
ALBUMIN: 3.4 g/dL — AB (ref 3.5–5.0)
ALK PHOS: 78 U/L (ref 40–150)
ALT: 21 U/L (ref 0–55)
ANION GAP: 10 meq/L (ref 3–11)
AST: 20 U/L (ref 5–34)
BUN: 11 mg/dL (ref 7.0–26.0)
CALCIUM: 9.2 mg/dL (ref 8.4–10.4)
CO2: 22 mEq/L (ref 22–29)
Chloride: 106 mEq/L (ref 98–109)
Creatinine: 0.8 mg/dL (ref 0.6–1.1)
GLUCOSE: 106 mg/dL (ref 70–140)
Potassium: 4.1 mEq/L (ref 3.5–5.1)
Sodium: 139 mEq/L (ref 136–145)
TOTAL PROTEIN: 6.9 g/dL (ref 6.4–8.3)

## 2014-12-03 MED ORDER — SODIUM CHLORIDE 0.9 % IJ SOLN
10.0000 mL | INTRAMUSCULAR | Status: DC | PRN
  Administered 2014-12-03: 10 mL via INTRAVENOUS
  Filled 2014-12-03: qty 10

## 2014-12-03 MED ORDER — HEPARIN SOD (PORK) LOCK FLUSH 100 UNIT/ML IV SOLN
500.0000 [IU] | Freq: Once | INTRAVENOUS | Status: AC
  Administered 2014-12-03: 500 [IU] via INTRAVENOUS
  Filled 2014-12-03: qty 5

## 2014-12-03 NOTE — Progress Notes (Signed)
[employer] [facility] [employer]  [phone] Fax:(336) [phone]     ID: [name] DOB: 1970-10-03  MR#: [id number]  WVP#:[id number]  Patient Care Team: Deland Pretty, MD as PCP - General (Internal Medicine) Erroll Luna, MD as Consulting Physician (General Surgery) Chauncey Cruel, MD as Consulting Physician (Oncology) Everlene Farrier, MD as Consulting Physician (Obstetrics and Gynecology)  CHIEF COMPLAINT:  triple positive breast cancer  CURRENT TREATMENT:  Trastuzumab  BREAST CANCER HISTORY: From the original intake note:  The patient herself palpated a mass in her right breast mid July. She brought it to Dr. Sherlynn Stalls attention and he set the patient up for bilateral diagnostic mammography and right breast ultrasonography of the breast Center 07/28/2013. Right mammogram showed an irregular mass in the upper outer quadrant measuring 1.4 cm. There was some associated pleomorphic microcalcifications. This was palpable, at the 10:30 position 11 cm from the nipple. By palpation it measured approximately 1.5 cm. Ultrasound confirmed an hypoechoic mass with irregular borders measuring 1.1 cm. The right axilla showed a few adjacent deep lymph nodes with mild cortical thickening.  On 08/08/2013 the patient underwent biopsy of the breast mass in question. The pathology (SAA [id number]) showed an invasive ductal carcinoma, grade 2, estrogen receptor 49% positive, progesterone receptor 47% positive, both with moderate staining intensity, with an MIB-1 of 52%, and HER-2 amplification with a signals ratio of 8.1. The right axillary lymph node biopsied at the same time was benign this was felt to be possibly concordant.  And 08/15/2013 the patient underwent bilateral breast MRI showing a 2 cm irregular enhancing mass in the upper outer quadrant of the right breast. There were no additional changes no abnormal appearing lymph nodes and no masses or abnormalities in the left breast.  The  patient's subsequent history is as detailed below  INTERVAL HISTORY: [name] returns today for follow up of her breast cancer. She has been on tamoxifen since May of this year and tolerates well. Hot flashes were initially an issue and she was prescribed gabapentin. But she has noticed she can handle them on her own and has stopped this drug. She denies vaginal changes. Her periods never returned, and she is ok this with this. The interval history is remarkable for her mother's knee replacement. The patient has been living in [place] full time for the past few weeks to care for her. Being in this environment is somewhat disruptive to her routine however. She is not taking her diet and exercise too seriously right now.   REVIEW OF SYSTEMS: [name] was recently prescribed spironolactone 80m BID for chronic bilateral lower extremity edema. Occasionally she misses doses. She does not like to wear compression socks and has not been elevating her feet as advised, so the swelling is worse than usual today. She denies shortness of breath, chest pain, cough, or palpitations. She has no fevers, chills, nausea, vomiting, or changes in bowel or bladder habits. She denies pain beyond joint stiffness. A detailed review of systems is otherwise stable.  PAST MEDICAL HISTORY: Past Medical History  Diagnosis Date  . Anxiety   . Wears contact lenses   . Snores   . Malignant neoplasm of breast (female), unspecified site   . S/P radiation therapy 03/19/2014 through 05/03/2014     Right breast 4500 cGy in 25 sessions, right breast boost 1600 cGy in 8 sessions     PAST SURGICAL HISTORY: Past Surgical History  Procedure Laterality Date  . Wisdom tooth extraction    .  Portacath placement N/A 08/23/2013    Procedure: INSERTION PORT-A-CATH WITH ULTRA SOUND;  Surgeon: Joyice Faster. Cornett, MD;  Location: [facility];  Service: General;   Laterality: N/A;  . Breast lumpectomy with radioactive seed localization Right 01/25/2014    Procedure: RIGHT BREAST LUMPECTOMY WITH RADIOACTIVE SEED LOCALIZATION ;  Surgeon: Erroll Luna, MD;  Location: [facility];  Service: General;  Laterality: Right;  . Axillary sentinel node biopsy Right 01/25/2014    Procedure: RIGHT SENTINEL LYMPH NODE MAPPING;  Surgeon: Erroll Luna, MD;  Location: [facility];  Service: General;  Laterality: Right;    FAMILY HISTORY Family History  Problem Relation Age of Onset  . Uterine cancer Mother 78    TAH/BSO  . Colon cancer Father 75    deceased 21  . Leukemia Maternal Grandmother     deceased 76  . Skin cancer Paternal Grandmother   . Prostate cancer Other    the patient's father died from colon cancer the age of 30. It had been diagnosed 3 years prior. The patient's mother was diagnosed with cancer of the uterus at age 96. She is 45 years old as of August 2015. The patient had no brothers or sisters. There is a history of prostate cancer leukemia at in the family as well as melanoma, but there is no history of breast or ovarian cancer in the family.  GYNECOLOGIC HISTORY:  No LMP recorded. Patient is not currently having periods (Reason: Chemotherapy). Menarche age 35. The patient is GX P0. She used oral contraceptives for several years remotely with no complications. She stopped having periods after first cycle of chemotherapy.  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 She lives alone, with no pets.    ADVANCED DIRECTIVES: Not in place. At her 08/16/2013 visit the patient was given the upper. Documents 2 complete and notarize so she may name her mother, [name] as her healthcare power of attorney, which is the patient's intention. [name] can be reached at [phone]. She currently lives in [place] but is planning to move to [place]: Social History  Substance Use Topics  . Smoking status: Former Smoker    Quit date: 02/20/2013  . Smokeless tobacco: Not on file  . Alcohol Use: Yes     Comment: 1/week     Colonoscopy: Never  PAP: July 2015 Bone density: Never  Lipid panel:  Allergies  Allergen Reactions  . Amoxicillin Rash  . Chocolate Rash    Only Hershey's brand    Current Outpatient Prescriptions  Medication Sig Dispense Refill  . Calcium-Vitamin D-Vitamin K (CALCIUM SOFT CHEWS PO) Take 1 tablet by mouth daily.     Marland Kitchen gabapentin (NEURONTIN) 100 MG capsule Take 1 capsule (100 mg total) by mouth at bedtime. Take 1-3 capsules nightly for hotflashes 90 capsule 5  . Multiple Vitamins-Minerals (MULTI ADULT GUMMIES PO) Take 3-4 each by mouth daily.     Marland Kitchen spironolactone (ALDACTONE) 25 MG tablet 25 mg 2 (two) times daily.   0  . tamoxifen (NOLVADEX) 20 MG tablet Take 20 mg by mouth daily.    Marland Kitchen lidocaine-prilocaine (EMLA) cream Apply 1 application topically as needed. (Patient not taking: Reported on 12/03/2014) 30 g 0   No current facility-administered medications for this visit.    OBJECTIVE: Middle-aged [place] American woman who appears stated age 9 Vitals:   12/03/14 1503  BP: 138/92  Pulse: 101  Temp: 97.7 F (36.5 C)  Resp: 19     Body mass index is 54.63 kg/(m^2).     ECOG FS:1 - Symptomatic but completely ambulatory   Sclerae unicteric, pupils round and equal Oropharynx clear and moist-- no thrush or other lesions No cervical or supraclavicular adenopathy Lungs no rales or rhonchi Heart regular rate and rhythm Abd soft, nontender, positive bowel sounds MSK no [name] spinal tenderness, chronic bilateral +2 edema Neuro: nonfocal, well oriented, appropriate affect Breasts: right breast status post lumpectomy and radiation. No evidence of recurrent disease. Right axilla benign. Left breast unremarkable.  LAB RESULTS:  CMP     Component Value Date/Time   NA 139 12/03/2014 1449    NA 133* 06/22/2014 0017   K 4.1 12/03/2014 1449   K 4.1 06/22/2014 0017   CL 102 06/22/2014 0017   CO2 22 12/03/2014 1449   GLUCOSE 106 12/03/2014 1449   GLUCOSE 132* 06/22/2014 0017   BUN 11.0 12/03/2014 1449   BUN 16 06/22/2014 0017   CREATININE 0.8 12/03/2014 1449   CREATININE 1.10* 06/22/2014 0017   CALCIUM 9.2 12/03/2014 1449   PROT 6.9 12/03/2014 1449   ALBUMIN 3.4* 12/03/2014 1449   AST 20 12/03/2014 1449   ALT 21 12/03/2014 1449   ALKPHOS 78 12/03/2014 1449   BILITOT <0.30 12/03/2014 1449    I No results found for: SPEP  Lab Results  Component Value Date   WBC 6.8 12/03/2014   NEUTROABS 4.0 12/03/2014   HGB 11.7 12/03/2014   HCT 37.0 12/03/2014   MCV 84.9 12/03/2014   PLT 261 12/03/2014      Chemistry      Component Value Date/Time   NA 139 12/03/2014 1449   NA 133* 06/22/2014 0017   K 4.1 12/03/2014 1449   K 4.1 06/22/2014 0017   CL 102 06/22/2014 0017   CO2 22 12/03/2014 1449   BUN 11.0 12/03/2014 1449   BUN 16 06/22/2014 0017   CREATININE 0.8 12/03/2014 1449   CREATININE 1.10* 06/22/2014 0017      Component Value Date/Time   CALCIUM 9.2 12/03/2014 1449   ALKPHOS 78 12/03/2014 1449   AST 20 12/03/2014 1449   ALT 21 12/03/2014 1449   BILITOT <0.30 12/03/2014 1449       No results found for: LABCA2  No components found for: LABCA125  No results for input(s): INR in the last 168 hours.  Urinalysis    Component Value Date/Time   COLORURINE ORANGE* 06/22/2014 0019    STUDIES: No results found.  ASSESSMENT: 44 y.o. BRCA negative [place] woman status post right breast upper outer quadrant biopsy 08/08/2013 for a triple positive invasive ductal carcinoma, grade 2, with an MIB-1 of 52%, and a suspicious lymph node biopsied at the same time pathologically benign.  (1) genetics testing found no mutations in ATM, BARD1, BRCA1, BRCA2, BRIP1, CDH1, CHEK2, MRE11A, MUTYH, NBN, NF1, PALB2, PTEN, RAD50, RAD51C, RAD51D, and TP53  (2)  neoadjuvant chemotherapy consisted of carboplatin, docetaxel, trastuzumab and pertuzumab for 6 cycles, completed 12/21/2013  (a) pertuzumab discontinued after cycle 1 because of severe diarrhea  (b) carboplatin switched for cyclophosphamide because of hypersensitivity reaction on 11/20/13  (c) docetaxel removed from final cycle because of peripheral neuropathy symptoms.  (3) trastuzumab to continue to complete one year (through August 2016)  (a) repeat echocardiogram 06/25/2014 shows a well preserved ejection fraction  (4) status post right lumpectomy and sentinel lymph node sampling 01/25/2014 showing a residual pT1b pN0 invasive  ductal carcinoma, grade 2, again HER-2 amplified  (5) adjuvant radiation completed 05/03/2014  (6) tamoxifen started May 20116  PLAN: [name] is doing well as far as her breast cancer is concerned. She is approaching the 1 year anniversary of her definitive surgery. I have placed orders for her annual mammogram to be performed next month. She is tolerating the tamoxifen well and will continue this drug for at least 2 years before giving thought to switching to anastrozole.   We discussed her commitment to diet and exercise. I gave her a pamphlet on the livestrong program to review for when she returns to [place] .  She still has a port, but thinks she will keep it for some time, until her mother recovers. She will continue to have her port flushed every 6-8 weeks.   [name] will return in 6 months for labs and a follow up visit. She understands and agrees with this plan. She knows the goal of treatment in her case is cure. She has been encouraged to call with any issues that might arise before her next visit here.    Linda Panda, NP 12/03/2014 4:16 PM

## 2014-12-03 NOTE — Patient Instructions (Signed)

## 2014-12-04 ENCOUNTER — Encounter: Payer: Self-pay | Admitting: Nurse Practitioner

## 2014-12-05 ENCOUNTER — Telehealth: Payer: Self-pay | Admitting: Oncology

## 2014-12-05 NOTE — Telephone Encounter (Signed)
Called and message states not available,patient will get mammo through mychart and a 2day reminder

## 2015-01-28 ENCOUNTER — Ambulatory Visit (HOSPITAL_BASED_OUTPATIENT_CLINIC_OR_DEPARTMENT_OTHER): Payer: BLUE CROSS/BLUE SHIELD

## 2015-01-28 DIAGNOSIS — Z452 Encounter for adjustment and management of vascular access device: Secondary | ICD-10-CM

## 2015-01-28 DIAGNOSIS — C50411 Malignant neoplasm of upper-outer quadrant of right female breast: Secondary | ICD-10-CM | POA: Diagnosis not present

## 2015-01-28 DIAGNOSIS — C50919 Malignant neoplasm of unspecified site of unspecified female breast: Secondary | ICD-10-CM

## 2015-01-28 MED ORDER — HEPARIN SOD (PORK) LOCK FLUSH 100 UNIT/ML IV SOLN
500.0000 [IU] | Freq: Once | INTRAVENOUS | Status: AC
  Administered 2015-01-28: 500 [IU] via INTRAVENOUS
  Filled 2015-01-28: qty 5

## 2015-01-28 MED ORDER — SODIUM CHLORIDE 0.9 % IJ SOLN
10.0000 mL | INTRAMUSCULAR | Status: DC | PRN
  Administered 2015-01-28: 10 mL via INTRAVENOUS
  Filled 2015-01-28: qty 10

## 2015-01-28 NOTE — Patient Instructions (Signed)

## 2015-02-04 ENCOUNTER — Ambulatory Visit: Payer: Self-pay | Admitting: Surgery

## 2015-02-04 NOTE — H&P (Signed)
[name] 02/04/2015 11:00 AM Location: [facility] Patient #: [id number] DOB: 12/07/1970 Single / Language: [name] / Race: Black or African American Female  History of Present Illness Marcello Moores A. Irene Mitcham MD; 02/04/2015 12:10 PM) Patient words: reck   PT RETURNS FOR FOLLOW UP OF HER BREAST CANCER   SHE IS DONE WITH HER PORT AND WANTS IT OUT.   NO NEW COMPLAINTS                       BREAST CANCER HISTORY: From the original intake note:  The patient herself palpated a mass in her right breast mid July. She brought it to Dr. Sherlynn Stalls attention and he set the patient up for bilateral diagnostic mammography and right breast ultrasonography of the breast Center 07/28/2013. Right mammogram showed an irregular mass in the upper outer quadrant measuring 1.4 cm. There was some associated pleomorphic microcalcifications. This was palpable, at the 10:30 position 11 cm from the nipple. By palpation it measured approximately 1.5 cm. Ultrasound confirmed an hypoechoic mass with irregular borders measuring 1.1 cm. The right axilla showed a few adjacent deep lymph nodes with mild cortical thickening.  On 08/08/2013 the patient underwent biopsy of the breast mass in question. The pathology (SAA [id number]) showed an invasive ductal carcinoma, grade 2, estrogen receptor 49% positive, progesterone receptor 47% positive, both with moderate staining intensity, with an MIB-1 of 52%, and HER-2 amplification with a signals ratio of 8.1. The right axillary lymph node biopsied at the same time was benign this was felt to be possibly concordant.  And 08/15/2013 the patient underwent bilateral breast MRI showing a 2 cm irregular enhancing mass in the upper outer quadrant of the right breast. There were no additional changes no abnormal appearing lymph nodes and no masses or abnormalities in the left breast.  The patient's subsequent history is as detailed below  INTERVAL  HISTORY: Corbyn returns today for follow up of her breast cancer. She has been on tamoxifen since May of this year and tolerates well. Hot flashes were initially an issue and she was prescribed gabapentin. But she has noticed she can handle them on her own and has stopped this drug.  The patient is a 45 year old female.   Allergies ([name], CMA; 02/04/2015 11:01 AM) Amoxicillin *PENICILLINS*  Medication History ([name], CMA; 02/04/2015 11:01 AM) Gabapentin (600MG Tablet, Oral) Active. Tamoxifen Citrate (20MG Tablet, Oral) Active. Anucort-HC (25MG Suppository, Rectal) Active. Dexamethasone (4MG Tablet, Oral) Active. Anusol-HC (25MG Suppository, Rectal) Active. Roxicet (5-325MG Tablet, Oral) Active. Prochlorperazine Maleate (10MG Tablet, Oral) Active. Ondansetron HCl (8MG Tablet, Oral) Active. Tylenol Childrens (160MG/5ML Suspension, Oral) Active. Calcium-Phosphorus-Vitamin D (140-25-100MG-MG-UNIT Wafer, Oral) Active. Questran Light (4GM Packet, Oral) Active. LORazepam (0.5MG Tablet, Oral) Active. Lidocaine-Prilocaine (2.5-2.5% Cream, External as needed) Active. Multi Vitamin Daily (Oral) Active. Claritin (5MG Tablet Chewable, Oral) Active. Medications Reconciled    Vitals ([name] CMA; 02/04/2015 11:01 AM) 02/04/2015 11:01 AM Weight: 324 lb Height: 65in Body Surface Area: 2.43 m Body Mass Index: 53.92 kg/m  Temp.: 97.63F(Temporal)  Pulse: 120 (Regular)  BP: 138/84 (Sitting, Left Arm, Standard)      Physical Exam (Ellana Kawa A. Analeah Brame MD; 02/04/2015 12:11 PM)  Head and Neck Head-normocephalic, atraumatic with no lesions or palpable masses.  Chest and Lung Exam Chest and lung exam reveals -quiet, even and easy respiratory effort with no use of accessory muscles and on auscultation, normal breath sounds, no adventitious sounds and normal vocal resonance. Inspection Chest Wall -  Normal. Back - normal.  Breast Note:  Postlumpectomy and radiation changes noted right breast. Port-A-Cath right side intact without complicating feature. Left breast normal.  Cardiovascular Cardiovascular examination reveals -on palpation PMI is normal in location and amplitude, no palpable S3 or S4. Normal cardiac borders., normal heart sounds, regular rate and rhythm with no murmurs, carotid auscultation reveals no bruits and normal pedal pulses bilaterally.    Assessment & Plan (Tyleigh Mahn A. Cliffie Gingras MD; 02/04/2015 12:11 PM)  HISTORY OF BREAST CANCER (Z85.3) Impression: We'll schedule for port removal.  POST-OPERATIVE STATE (L57.262) Impression: PORT REMOVAL PT READY FOR PORT REMOVAL    RISK OF BLEEDING INFECTION CATHETER FRAGMENTATION LUNG INJURY BLOOD VESSEL INJURY NEED FOR MORE SURGERY DVT DEATH  Current Plans I recommended surgery to remove the catheter. I explained the technique of removal with use of local anesthesia & possible need for more aggressive sedation/anesthesia for patient comfort.  Risks such as bleeding, infection, and other risks were discussed. Post-operative dressing/incision care was discussed. I noted a good likelihood this will help address the problem. We will work to minimize complications. Questions were answered. The patient expresses understanding & wishes to proceed with surgery.  Pt Education - [facility] Free Text Education/Instructions: discussed with patient and provided information. You are being scheduled for surgery - Our schedulers will call you.  You should hear from our office's scheduling department within 5 working days about the location, date, and time of surgery. We try to make accommodations for patient's preferences in scheduling surgery, but sometimes the OR schedule or the surgeon's schedule prevents [place] from making those accommodations.  If you have not heard from our office [phone]) in 5 working days, call the office and ask for your surgeon's nurse.  If you have other  questions about your diagnosis, plan, or surgery, call the office and ask for your surgeon's nurse.

## 2015-02-20 MED FILL — AZITHROMYCIN 250 MG TABLET: 250 | 5 days supply | Qty: 6 | Fill #0

## 2015-02-20 MED FILL — HYDROCODONE-CHLORPHENIRAM S: 10-8 | 6 days supply | Qty: 60 | Fill #0

## 2015-02-27 ENCOUNTER — Encounter (HOSPITAL_BASED_OUTPATIENT_CLINIC_OR_DEPARTMENT_OTHER): Payer: Self-pay | Admitting: *Deleted

## 2015-02-28 MED FILL — SPIRONOLACTONE 25 MG TABLET: 25 | 30 days supply | Qty: 60 | Fill #1

## 2015-02-28 MED FILL — levoFLOXacin 500 MG TABS: 500 | 5 days supply | Qty: 5 | Fill #0

## 2015-02-28 MED FILL — TAMOXIFEN 20 MG TABLET: 20 | 90 days supply | Qty: 90 | Fill #3

## 2015-03-05 NOTE — Progress Notes (Signed)
Pt BMI is 55 as of today, called [facility]  Spoke to [place] she will rearrange and get her on schedule her at the main.  She will call and give pt new information.

## 2015-03-06 ENCOUNTER — Encounter (HOSPITAL_COMMUNITY): Payer: Self-pay | Admitting: *Deleted

## 2015-03-06 MED ORDER — CIPROFLOXACIN IN D5W 400 MG/200ML IV SOLN
400.0000 mg | INTRAVENOUS | Status: AC
  Administered 2015-03-07: 400 mg via INTRAVENOUS
  Filled 2015-03-06: qty 200

## 2015-03-06 MED ORDER — CHLORHEXIDINE GLUCONATE 4 % EX LIQD: 1.0000 "application " | Freq: Once | CUTANEOUS | Status: DC

## 2015-03-06 NOTE — Progress Notes (Signed)
Pt denies cardiac history, chest pain or sob. 

## 2015-03-07 ENCOUNTER — Ambulatory Visit (HOSPITAL_COMMUNITY): Payer: BLUE CROSS/BLUE SHIELD | Admitting: Anesthesiology

## 2015-03-07 ENCOUNTER — Ambulatory Visit (HOSPITAL_COMMUNITY)
Admission: RE | Admit: 2015-03-07 | Discharge: 2015-03-07 | Disposition: A | Discharge status: Home / Self Care | Payer: BLUE CROSS/BLUE SHIELD | Source: Ambulatory Visit | Attending: Surgery | Admitting: Surgery

## 2015-03-07 ENCOUNTER — Encounter (HOSPITAL_COMMUNITY)
Admission: RE | Disposition: A | Discharge status: Home / Self Care | Payer: Self-pay | Source: Ambulatory Visit | Attending: Surgery

## 2015-03-07 ENCOUNTER — Encounter (HOSPITAL_COMMUNITY): Payer: Self-pay | Admitting: *Deleted

## 2015-03-07 DIAGNOSIS — Z853 Personal history of malignant neoplasm of breast: Secondary | ICD-10-CM | POA: Insufficient documentation

## 2015-03-07 DIAGNOSIS — Z452 Encounter for adjustment and management of vascular access device: Secondary | ICD-10-CM | POA: Diagnosis present

## 2015-03-07 DIAGNOSIS — Z6841 Body Mass Index (BMI) 40.0 and over, adult: Secondary | ICD-10-CM | POA: Insufficient documentation

## 2015-03-07 DIAGNOSIS — Z87891 Personal history of nicotine dependence: Secondary | ICD-10-CM | POA: Diagnosis not present

## 2015-03-07 HISTORY — PX: PORT-A-CATH REMOVAL: SHX5289

## 2015-03-07 HISTORY — DX: Other specified symptoms and signs involving the circulatory and respiratory systems: R09.89

## 2015-03-07 HISTORY — DX: Gastro-esophageal reflux disease without esophagitis: K21.9

## 2015-03-07 LAB — COMPREHENSIVE METABOLIC PANEL
ALBUMIN: 3.4 g/dL — AB (ref 3.5–5.0)
ALK PHOS: 64 U/L (ref 38–126)
ALT: 32 U/L (ref 14–54)
AST: 33 U/L (ref 15–41)
Anion gap: 10 (ref 5–15)
BUN: 14 mg/dL (ref 6–20)
CO2: 23 mmol/L (ref 22–32)
CREATININE: 0.91 mg/dL (ref 0.44–1.00)
Calcium: 9.2 mg/dL (ref 8.9–10.3)
Chloride: 108 mmol/L (ref 101–111)
GFR calc Af Amer: >60 mL/min (ref >60)
GFR calc non Af Amer: >60 mL/min (ref >60)
Glucose, Bld: 97 mg/dL (ref 65–99)
Potassium: 4.3 mmol/L (ref 3.5–5.1)
SODIUM: 141 mmol/L (ref 135–145)
TOTAL PROTEIN: 6.6 g/dL (ref 6.5–8.1)
Total Bilirubin: 0.4 mg/dL (ref 0.3–1.2)

## 2015-03-07 LAB — CBC WITH DIFFERENTIAL/PLATELET
BASOS PCT: 1 %
Basophils Absolute: 0 10*3/uL (ref 0.0–0.1)
EOS ABS: 0.1 10*3/uL (ref 0.0–0.7)
Eosinophils Relative: 1 %
HCT: 37.4 % (ref 36.0–46.0)
HEMOGLOBIN: 11.7 g/dL — AB (ref 12.0–15.0)
Lymphocytes Relative: 44 %
Lymphs Abs: 2.6 10*3/uL (ref 0.7–4.0)
MCH: 27 pg (ref 26.0–34.0)
MCHC: 31.3 g/dL (ref 30.0–36.0)
MCV: 86.4 fL (ref 78.0–100.0)
Monocytes Absolute: 0.5 10*3/uL (ref 0.1–1.0)
Monocytes Relative: 8 %
NEUTROS PCT: 46 %
Neutro Abs: 2.7 10*3/uL (ref 1.7–7.7)
Platelets: 237 10*3/uL (ref 150–400)
RBC: 4.33 MIL/uL (ref 3.87–5.11)
RDW: 16.2 % — ABNORMAL HIGH (ref 11.5–15.5)
WBC: 5.9 10*3/uL (ref 4.0–10.5)

## 2015-03-07 SURGERY — REMOVAL PORT-A-CATH
Anesthesia: Monitor Anesthesia Care

## 2015-03-07 MED ORDER — LIDOCAINE HCL (CARDIAC) 20 MG/ML IV SOLN
INTRAVENOUS | Status: DC | PRN
  Administered 2015-03-07: 50 mg via INTRAVENOUS

## 2015-03-07 MED ORDER — MIDAZOLAM HCL 5 MG/5ML IJ SOLN
INTRAMUSCULAR | Status: DC | PRN
  Administered 2015-03-07: 2 mg via INTRAVENOUS

## 2015-03-07 MED ORDER — LACTATED RINGERS IV SOLN
INTRAVENOUS | Status: DC | PRN
  Administered 2015-03-07: 13:00:00 via INTRAVENOUS

## 2015-03-07 MED ORDER — BUPIVACAINE-EPINEPHRINE (PF) 0.25% -1:200000 IJ SOLN
INTRAMUSCULAR | Status: AC
  Filled 2015-03-07: qty 30

## 2015-03-07 MED ORDER — BUPIVACAINE-EPINEPHRINE 0.25% -1:200000 IJ SOLN
INTRAMUSCULAR | Status: DC | PRN
  Administered 2015-03-07: 10 mL

## 2015-03-07 MED ORDER — HYDROCODONE-ACETAMINOPHEN 5-325 MG PO TABS: 1.0000 | ORAL_TABLET | Freq: Four times a day (QID) | ORAL | Status: DC | PRN

## 2015-03-07 MED ORDER — PROPOFOL 10 MG/ML IV BOLUS
INTRAVENOUS | Status: DC | PRN
  Administered 2015-03-07: 30 mg via INTRAVENOUS

## 2015-03-07 MED ORDER — FENTANYL CITRATE (PF) 250 MCG/5ML IJ SOLN
INTRAMUSCULAR | Status: AC
  Filled 2015-03-07: qty 5

## 2015-03-07 MED ORDER — PROPOFOL 10 MG/ML IV BOLUS
INTRAVENOUS | Status: AC
  Filled 2015-03-07: qty 20

## 2015-03-07 MED ORDER — FENTANYL CITRATE (PF) 100 MCG/2ML IJ SOLN
INTRAMUSCULAR | Status: DC | PRN
  Administered 2015-03-07: 50 ug via INTRAVENOUS

## 2015-03-07 MED ORDER — PROPOFOL 500 MG/50ML IV EMUL
INTRAVENOUS | Status: DC | PRN
  Administered 2015-03-07: 75 ug/kg/min via INTRAVENOUS

## 2015-03-07 MED ORDER — LACTATED RINGERS IV SOLN
INTRAVENOUS | Status: DC
  Administered 2015-03-07: 12:00:00 via INTRAVENOUS

## 2015-03-07 MED ORDER — MIDAZOLAM HCL 2 MG/2ML IJ SOLN
INTRAMUSCULAR | Status: AC
  Filled 2015-03-07: qty 2

## 2015-03-07 MED ORDER — FENTANYL CITRATE (PF) 100 MCG/2ML IJ SOLN: 25.0000 ug | INTRAMUSCULAR | Status: DC | PRN

## 2015-03-07 MED ORDER — ONDANSETRON HCL 4 MG/2ML IJ SOLN
INTRAMUSCULAR | Status: DC | PRN
  Administered 2015-03-07: 4 mg via INTRAVENOUS

## 2015-03-07 MED FILL — HYDROCODON-APAP 5-325: 5-325 | 8 days supply | Qty: 30 | Fill #0

## 2015-03-07 SURGICAL SUPPLY — 36 items
BINDER BREAST XLRG (GAUZE/BANDAGES/DRESSINGS) IMPLANT
BINDER BREAST XXLRG (GAUZE/BANDAGES/DRESSINGS) ×1 IMPLANT
CHLORAPREP W/TINT 10.5 ML (MISCELLANEOUS) ×2 IMPLANT
COVER SURGICAL LIGHT HANDLE (MISCELLANEOUS) ×2 IMPLANT
DECANTER SPIKE VIAL GLASS SM (MISCELLANEOUS) ×2 IMPLANT
DRAPE LAPAROTOMY T 98X78 PEDS (DRAPES) ×2 IMPLANT
DRAPE UTILITY XL STRL (DRAPES) ×4 IMPLANT
ELECT CAUTERY BLADE 6.4 (BLADE) ×2 IMPLANT
ELECT REM PT RETURN 9FT ADLT (ELECTROSURGICAL) ×2
ELECTRODE REM PT RTRN 9FT ADLT (ELECTROSURGICAL) ×1 IMPLANT
GAUZE SPONGE 4X4 16PLY XRAY LF (GAUZE/BANDAGES/DRESSINGS) ×2 IMPLANT
GLOVE BIO SURGEON STRL SZ7 (GLOVE) ×1 IMPLANT
GLOVE BIO SURGEON STRL SZ8 (GLOVE) ×2 IMPLANT
GLOVE BIOGEL M 7.0 STRL (GLOVE) ×1 IMPLANT
GLOVE BIOGEL PI IND STRL 8 (GLOVE) ×1 IMPLANT
GLOVE BIOGEL PI INDICATOR 8 (GLOVE) ×1
GOWN STRL REUS W/ TWL LRG LVL3 (GOWN DISPOSABLE) ×2 IMPLANT
GOWN STRL REUS W/ TWL XL LVL3 (GOWN DISPOSABLE) ×1 IMPLANT
GOWN STRL REUS W/TWL LRG LVL3 (GOWN DISPOSABLE) ×4
GOWN STRL REUS W/TWL XL LVL3 (GOWN DISPOSABLE) ×2
KIT BASIN OR (CUSTOM PROCEDURE TRAY) ×2 IMPLANT
KIT ROOM TURNOVER OR (KITS) ×2 IMPLANT
LIQUID BAND (GAUZE/BANDAGES/DRESSINGS) ×2 IMPLANT
NDL HYPO 25GX1X1/2 BEV (NEEDLE) ×1 IMPLANT
NEEDLE HYPO 25GX1X1/2 BEV (NEEDLE) ×2 IMPLANT
NS IRRIG 1000ML POUR BTL (IV SOLUTION) ×2 IMPLANT
PACK SURGICAL SETUP 50X90 (CUSTOM PROCEDURE TRAY) ×2 IMPLANT
PAD ARMBOARD 7.5X6 YLW CONV (MISCELLANEOUS) ×4 IMPLANT
PENCIL BUTTON HOLSTER BLD 10FT (ELECTRODE) ×2 IMPLANT
SUT MNCRL AB 4-0 PS2 18 (SUTURE) ×2 IMPLANT
SUT VIC AB 3-0 SH 27 (SUTURE) ×2
SUT VIC AB 3-0 SH 27X BRD (SUTURE) ×1 IMPLANT
SYR CONTROL 10ML LL (SYRINGE) ×2 IMPLANT
TOWEL OR 17X24 6PK STRL BLUE (TOWEL DISPOSABLE) ×2 IMPLANT
TOWEL OR 17X26 10 PK STRL BLUE (TOWEL DISPOSABLE) ×2 IMPLANT
WATER STERILE IRR 1000ML POUR (IV SOLUTION) IMPLANT

## 2015-03-07 NOTE — H&P (Signed)
H&P   [name] (MR# [id number])      H&P Info    Author Note Status Last Update User Last Update Date/Time   Erroll Luna, MD Signed Erroll Luna, MD     H&P    Expand All Collapse All    [name] 02/04/2015 11:00 AM Location: [facility] Patient #: [id number] DOB: 09-07-1970 Single / Language: [name] / Race: Black or African American Female  History of Present Illness  Patient words: reck   PT RETURNS FOR FOLLOW UP OF HER BREAST CANCER   SHE IS DONE WITH HER PORT AND WANTS IT OUT.   NO NEW COMPLAINTS                       BREAST CANCER HISTORY: From the original intake note:  The patient herself palpated a mass in her right breast mid July. She brought it to Dr. Sherlynn Stalls attention and he set the patient up for bilateral diagnostic mammography and right breast ultrasonography of the breast Center 07/28/2013. Right mammogram showed an irregular mass in the upper outer quadrant measuring 1.4 cm. There was some associated pleomorphic microcalcifications. This was palpable, at the 10:30 position 11 cm from the nipple. By palpation it measured approximately 1.5 cm. Ultrasound confirmed an hypoechoic mass with irregular borders measuring 1.1 cm. The right axilla showed a few adjacent deep lymph nodes with mild cortical thickening.  On 08/08/2013 the patient underwent biopsy of the breast mass in question. The pathology (SAA [id number]) showed an invasive ductal carcinoma, grade 2, estrogen receptor 49% positive, progesterone receptor 47% positive, both with moderate staining intensity, with an MIB-1 of 52%, and HER-2 amplification with a signals ratio of 8.1. The right axillary lymph node biopsied at the same time was benign this was felt to be possibly concordant.  And 08/15/2013 the patient underwent bilateral breast MRI showing a 2 cm irregular enhancing mass in the upper outer quadrant of the right breast. There were no additional  changes no abnormal appearing lymph nodes and no masses or abnormalities in the left breast.  The patient's subsequent history is as detailed below  INTERVAL HISTORY: [name] returns today for follow up of her breast cancer. She has been on tamoxifen since May of this year and tolerates well. Hot flashes were initially an issue and she was prescribed gabapentin. But she has noticed she can handle them on her own and has stopped this drug.  The patient is a 45 year old female.   Allergies ([name], CMA; 02/04/2015 11:01 AM) Amoxicillin *PENICILLINS*  Medication History ([name], CMA; 02/04/2015 11:01 AM) Gabapentin (600MG Tablet, Oral) Active. Tamoxifen Citrate (20MG Tablet, Oral) Active. Anucort-HC (25MG Suppository, Rectal) Active. Dexamethasone (4MG Tablet, Oral) Active. Anusol-HC (25MG Suppository, Rectal) Active. Roxicet (5-325MG Tablet, Oral) Active. Prochlorperazine Maleate (10MG Tablet, Oral) Active. Ondansetron HCl (8MG Tablet, Oral) Active. Tylenol Childrens (160MG/5ML Suspension, Oral) Active. Calcium-Phosphorus-Vitamin D (140-25-100MG-MG-UNIT Wafer, Oral) Active. Questran Light (4GM Packet, Oral) Active. LORazepam (0.5MG Tablet, Oral) Active. Lidocaine-Prilocaine (2.5-2.5% Cream, External as needed) Active. Multi Vitamin Daily (Oral) Active. Claritin (5MG Tablet Chewable, Oral) Active. Medications Reconciled    Vitals ([name] CMA; 02/04/2015 11:01 AM) 02/04/2015 11:01 AM Weight: 324 lb Height: 65in Body Surface Area: 2.43 m Body Mass Index: 53.92 kg/m  Temp.: 97.58F(Temporal)  Pulse: 120 (Regular)  BP: 138/84 (Sitting, Left Arm, Standard)      Physical Exam (Israella Hubert A. Leelynd Maldonado MD; 02/04/2015 12:11 PM)  Head and Neck Head-normocephalic,  atraumatic with no lesions or palpable masses.  Chest and Lung Exam Chest and lung exam reveals -quiet, even and easy respiratory effort with no use of accessory muscles and on  auscultation, normal breath sounds, no adventitious sounds and normal vocal resonance. Inspection Chest Wall - Normal. Back - normal.  Breast Note: Postlumpectomy and radiation changes noted right breast. Port-A-Cath right side intact without complicating feature. Left breast normal.  Cardiovascular Cardiovascular examination reveals -on palpation PMI is normal in location and amplitude, no palpable S3 or S4. Normal cardiac borders., normal heart sounds, regular rate and rhythm with no murmurs, carotid auscultation reveals no bruits and normal pedal pulses bilaterally.    Assessment & Plan (Kalimah Capurro A. Fong Mccarry MD; 02/04/2015 12:11 PM)  HISTORY OF BREAST CANCER (Z85.3) Impression: We'll schedule for port removal.  POST-OPERATIVE STATE (R33.125) Impression: PORT REMOVAL PT READY FOR PORT REMOVAL    RISK OF BLEEDING INFECTION CATHETER FRAGMENTATION LUNG INJURY BLOOD VESSEL INJURY NEED FOR MORE SURGERY DVT DEATH  Current Plans I recommended surgery to remove the catheter. I explained the technique of removal with use of local anesthesia & possible need for more aggressive sedation/anesthesia for patient comfort.  Risks such as bleeding, infection, and other risks were discussed. Post-operative dressing/incision care was discussed. I noted a good likelihood this will help address the problem. We will work to minimize complications. Questions were answered. The patient expresses understanding & wishes to proceed with surgery.  Pt Education - [facility] Free Text Education/Instructions: discussed with patient and provided information. You are being scheduled for surgery - Our schedulers will call you.  You should hear from our office's scheduling department within 5 working days about the location, date, and time of surgery. We try to make accommodations for patient's preferences in scheduling surgery, but sometimes the OR schedule or the surgeon's schedule prevents [place] from making those  accommodations.  If you have not heard from our office [phone]) in 5 working days, call the office and ask for your surgeon's nurse.  If you have other questions about your diagnosis, plan, or surgery, call the office and ask for your surgeon's nurse.

## 2015-03-07 NOTE — Discharge Instructions (Signed)
GENERAL SURGERY: POST OP INSTRUCTIONS ° °1. DIET: Follow a light bland diet the first 24 hours after arrival home, such as soup, liquids, crackers, etc.  Be sure to include lots of fluids daily.  Avoid fast food or heavy meals as your are more likely to get nauseated.   °2. Take your usually prescribed home medications unless otherwise directed. °3. PAIN CONTROL: °a. Pain is best controlled by a usual combination of three different methods TOGETHER: °i. Ice/Heat °ii. Over the counter pain medication °iii. Prescription pain medication °b. Most patients will experience some swelling and bruising around the incisions.  Ice packs or heating pads (30-60 minutes up to 6 times a day) will help. Use ice for the first few days to help decrease swelling and bruising, then switch to heat to help relax tight/sore spots and speed recovery.  Some people prefer to use ice alone, heat alone, alternating between ice & heat.  Experiment to what works for you.  Swelling and bruising can take several weeks to resolve.   °c. It is helpful to take an over-the-counter pain medication regularly for the first few weeks.  Choose one of the following that works best for you: °i. Naproxen (Aleve, etc)  Two 220mg tabs twice a day °ii. Ibuprofen (Advil, etc) Three 200mg tabs four times a day (every meal & bedtime) °iii. Acetaminophen (Tylenol, etc) 500-650mg four times a day (every meal & bedtime) °d. A  prescription for pain medication (such as oxycodone, hydrocodone, etc) should be given to you upon discharge.  Take your pain medication as prescribed.  °i. If you are having problems/concerns with the prescription medicine (does not control pain, nausea, vomiting, rash, itching, etc), please call us [phone] to see if we need to switch you to a different pain medicine that will work better for you and/or control your side effect better. °ii. If you need a refill on your pain medication, please contact your pharmacy.  They will contact our  office to request authorization. Prescriptions will not be filled after 5 pm or on week-ends. °4. Avoid getting constipated.  Between the surgery and the pain medications, it is common to experience some constipation.  Increasing fluid intake and taking a fiber supplement (such as Metamucil, Citrucel, FiberCon, MiraLax, etc) 1-2 times a day regularly will usually help prevent this problem from occurring.  A mild laxative (prune juice, Milk of Magnesia, MiraLax, etc) should be taken according to package directions if there are no bowel movements after 48 hours.   °5. Wash / shower every day.  You may shower over the dressings as they are waterproof.  Continue to shower over incision(s) after the dressing is off. °6. Remove your waterproof bandages 5 days after surgery.  You may leave the incision open to air.  You may have skin tapes (Steri Strips) covering the incision(s).  Leave them on until one week, then remove.  You may replace a dressing/Band-Aid to cover the incision for comfort if you wish.  ° ° ° ° °7. ACTIVITIES as tolerated:   °a. You may resume regular (light) daily activities beginning the next day--such as daily self-care, walking, climbing stairs--gradually increasing activities as tolerated.  If you can walk 30 minutes without difficulty, it is safe to try more intense activity such as jogging, treadmill, bicycling, low-impact aerobics, swimming, etc. °b. Save the most intensive and strenuous activity for last such as sit-ups, heavy lifting, contact sports, etc  Refrain from any heavy lifting or straining until you   are off narcotics for pain control.   °c. DO NOT PUSH THROUGH PAIN.  Let pain be your guide: If it hurts to do something, don't do it.  Pain is your body warning you to avoid that activity for another week until the pain goes down. °d. You may drive when you are no longer taking prescription pain medication, you can comfortably wear a seatbelt, and you can safely maneuver your car and  apply brakes. °e. You may have sexual intercourse when it is comfortable.  °8. FOLLOW UP in our office °a. Please call [facility] at [phone] to set up an appointment to see your surgeon in the office for a follow-up appointment approximately 2-3 weeks after your surgery. °b. Make sure that you call for this appointment the day you arrive home to insure a convenient appointment time. °9. IF YOU HAVE DISABILITY OR FAMILY LEAVE FORMS, BRING THEM TO THE OFFICE FOR PROCESSING.  DO NOT GIVE THEM TO YOUR DOCTOR. ° ° °WHEN TO CALL US [phone]: °1. Poor pain control °2. Reactions / problems with new medications (rash/itching, nausea, etc)  °3. Fever over 101.5 F (38.5 C) °4. Worsening swelling or bruising °5. Continued bleeding from incision. °6. Increased pain, redness, or drainage from the incision °7. Difficulty breathing / swallowing ° ° The clinic staff is available to answer your questions during regular business hours (8:30am-5pm).  Please don’t hesitate to call and ask to speak to one of our nurses for clinical concerns.  ° If you have a medical emergency, go to the nearest emergency room or call 911. ° A surgeon from [facility] is always on call at the hospitals ° ° °[facility] °[address], [address], [place], [place]  [address] ? °MAIN: [phone] ? TOLL FREE: [phone] ?  °FAX [phone] °[email or web address] ° °

## 2015-03-07 NOTE — Progress Notes (Signed)
Pt request to have breast binder dr cornett callrd and order obtained for breast binder and ordered from or

## 2015-03-07 NOTE — Anesthesia Preprocedure Evaluation (Addendum)
Anesthesia Evaluation  Patient identified by MRN, date of birth, ID band Patient awake    Reviewed: Allergy & Precautions, H&P , NPO status , Patient's Chart, lab work & pertinent test results  Airway Mallampati: II  TM Distance: >3 FB Neck ROM: Full    Dental no notable dental hx. (+) Teeth Intact, Dental Advisory Given   Pulmonary sleep apnea , former smoker,    Pulmonary exam normal breath sounds clear to auscultation       Cardiovascular + Peripheral Vascular Disease  negative cardio ROS   Rhythm:Regular Rate:Normal     Neuro/Psych negative neurological ROS  negative psych ROS   GI/Hepatic Neg liver ROS, GERD  Medicated and Controlled,  Endo/Other  Morbid obesity  Renal/GU negative Renal ROS  negative genitourinary   Musculoskeletal   Abdominal   Peds  Hematology negative hematology ROS (+)   Anesthesia Other Findings   Reproductive/Obstetrics negative OB ROS                            Anesthesia Physical Anesthesia Plan  ASA: III  Anesthesia Plan: MAC   Post-op Pain Management:    Induction: Intravenous  Airway Management Planned: Simple Face Mask  Additional Equipment:   Intra-op Plan:   Post-operative Plan:   Informed Consent: I have reviewed the patients History and Physical, chart, labs and discussed the procedure including the risks, benefits and alternatives for the proposed anesthesia with the patient or authorized representative who has indicated his/her understanding and acceptance.   Dental advisory given  Plan Discussed with: CRNA  Anesthesia Plan Comments:         Anesthesia Quick Evaluation

## 2015-03-07 NOTE — Interval H&P Note (Signed)
History and Physical Interval Note:  03/07/2015 12:39 PM  [name]  has presented today for surgery, with the diagnosis of Port in place  The various methods of treatment have been discussed with the patient and family. After consideration of risks, benefits and other options for treatment, the patient has consented to  Procedure(s): REMOVAL PORT-A-CATH (N/A) as a surgical intervention .  The patient's history has been reviewed, patient examined, no change in status, stable for surgery.  I have reviewed the patient's chart and labs.  Questions were answered to the patient's satisfaction.     Harveer Sadler A.

## 2015-03-07 NOTE — Anesthesia Postprocedure Evaluation (Signed)
Anesthesia Post Note  Patient: [name]  Procedure(s) Performed: Procedure(s) (LRB): REMOVAL PORT-A-CATH (N/A)  Patient location during evaluation: PACU Anesthesia Type: MAC Level of consciousness: awake and alert Pain management: pain level controlled Vital Signs Assessment: post-procedure vital signs reviewed and stable Respiratory status: spontaneous breathing, nonlabored ventilation and respiratory function stable Cardiovascular status: stable and blood pressure returned to baseline Anesthetic complications: no    Last Vitals:  Filed Vitals:   03/07/15 1400 03/07/15 1415  BP: 100/77 101/74  Pulse: 85 80  Temp:  36.7 C  Resp: 19 17    Last Pain: There were no vitals filed for this visit.               Milika Ventress,W. EDMOND

## 2015-03-07 NOTE — Transfer of Care (Signed)
Immediate Anesthesia Transfer of Care Note  Patient: [name]  Procedure(s) Performed: Procedure(s): REMOVAL PORT-A-CATH (N/A)  Patient Location: PACU  Anesthesia Type:MAC  Level of Consciousness: awake and alert   Airway & Oxygen Therapy: Patient Spontanous Breathing and Patient connected to nasal cannula oxygen  Post-op Assessment: Report given to RN and Post -op Vital signs reviewed and stable  Post vital signs: Reviewed and stable  Last Vitals:  Filed Vitals:   03/07/15 1117  BP: 98/56  Pulse: 92  Temp: 36.6 C  Resp: 18    Complications: No apparent anesthesia complications

## 2015-03-07 NOTE — Op Note (Signed)
Preop diagnosis: Indwelling port a catheter for chemotherapy  Postop diagnosis: Same  Procedure: Removal of port a catheter  Surgeon: Jaxten Brosh M.D.  Anesthesia: MAC with local  EBL: Minimal  Specimen none  Drains: None  Indications for procedure: The patient presents for removal of port a catheter after completing chemotherapy. The patient no longer requires central venous access. Risks of bleeding, infection, catheter fragmentation, embolization, arrhythmias and damage to arteries, veins and nerves and possibly other mediastinal structures discussed. The patient agrees to proceed.  Description of procedure: The patient was seen in the holding area. Questions were answered. The patient agreed to proceed. The patient was taken to the operating room. The patient was placed supine. Anesthesia was initiated. The skin on the right  upper chest was prepped and draped in a sterile fashion. Timeout was done. The patient received preoperative antibiotics. Incision was made through the old port site and the hub of the Port-A-Cath was seen. The sutures were cut to release the port from the chest wall. The catheter was removed in its entirety without difficulty. The tract was closed with 3-0 Vicryl. 4 Monocryl was used to close the skin. All final counts were correct. The patient was taken to recovery in satisfactory condition. 

## 2015-03-08 ENCOUNTER — Encounter (HOSPITAL_COMMUNITY): Payer: Self-pay | Admitting: Surgery

## 2015-03-20 ENCOUNTER — Other Ambulatory Visit: Payer: Self-pay | Admitting: Nurse Practitioner

## 2015-03-25 ENCOUNTER — Telehealth: Payer: Self-pay | Admitting: Oncology

## 2015-03-25 NOTE — Telephone Encounter (Signed)
pt cx all appts-no longer have a port

## 2015-05-23 ENCOUNTER — Other Ambulatory Visit: Payer: BLUE CROSS/BLUE SHIELD

## 2015-05-23 ENCOUNTER — Ambulatory Visit: Payer: BLUE CROSS/BLUE SHIELD | Admitting: Oncology

## 2015-05-23 ENCOUNTER — Ambulatory Visit: Payer: BLUE CROSS/BLUE SHIELD | Admitting: Nurse Practitioner

## 2015-06-25 MED FILL — SPIRONOLACTONE 25 MG TABLET: 25 | 30 days supply | Qty: 60 | Fill #2

## 2015-07-29 ENCOUNTER — Telehealth: Payer: Self-pay | Admitting: *Deleted

## 2015-07-29 ENCOUNTER — Other Ambulatory Visit: Payer: Self-pay | Admitting: Oncology

## 2015-07-29 DIAGNOSIS — C50411 Malignant neoplasm of upper-outer quadrant of right female breast: Secondary | ICD-10-CM

## 2015-07-29 MED FILL — TAMOXIFEN 20 MG TABLET: 20 | 90 days supply | Qty: 90 | Fill #0

## 2015-07-29 MED FILL — SPIRONOLACTONE 25 MG TABLET: 25 | 30 days supply | Qty: 60 | Fill #3

## 2015-07-29 NOTE — Telephone Encounter (Signed)
This RN reviewed chart and noted pt needs an appointment - in box request sent for next available.

## 2015-07-29 NOTE — Telephone Encounter (Signed)
"  I'm out of tamoxifen and calling to nudge you all to refill to [facility]."  This nurse sent one 90-day refill and advised patient needs to call to schedule Mammogram and reschedule missed visit May 2017.   "I didn't know I missed an appointment.  I called to cancel my flush after port-a-cath was removed.  They cancelled everything." Will notify provider to determine when patient needs to come in for next F/U.

## 2015-07-29 NOTE — Telephone Encounter (Signed)
Patient notified she needs F/U appointment

## 2015-08-05 ENCOUNTER — Other Ambulatory Visit: Payer: Self-pay | Admitting: *Deleted

## 2015-08-05 DIAGNOSIS — C50411 Malignant neoplasm of upper-outer quadrant of right female breast: Secondary | ICD-10-CM
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
## 2015-08-06 ENCOUNTER — Ambulatory Visit (HOSPITAL_BASED_OUTPATIENT_CLINIC_OR_DEPARTMENT_OTHER): Payer: BLUE CROSS/BLUE SHIELD | Admitting: Oncology

## 2015-08-06 ENCOUNTER — Other Ambulatory Visit (HOSPITAL_BASED_OUTPATIENT_CLINIC_OR_DEPARTMENT_OTHER): Payer: BLUE CROSS/BLUE SHIELD

## 2015-08-06 VITALS — BP 119/82 | HR 101 | Temp 98.9°F | Resp 18 | Ht 65.0 in | Wt 328.1 lb

## 2015-08-06 DIAGNOSIS — C50411 Malignant neoplasm of upper-outer quadrant of right female breast: Secondary | ICD-10-CM | POA: Diagnosis not present

## 2015-08-06 DIAGNOSIS — G3184 Mild cognitive impairment, so stated: Secondary | ICD-10-CM

## 2015-08-06 DIAGNOSIS — Z170 Estrogen receptor positive status [ER+]: Secondary | ICD-10-CM

## 2015-08-06 LAB — COMPREHENSIVE METABOLIC PANEL
ALBUMIN: 3.1 g/dL — AB (ref 3.5–5.0)
ALT: 27 U/L (ref 0–55)
AST: 22 U/L (ref 5–34)
Alkaline Phosphatase: 70 U/L (ref 40–150)
Anion Gap: 9 mEq/L (ref 3–11)
BUN: 14.6 mg/dL (ref 7.0–26.0)
CHLORIDE: 109 meq/L (ref 98–109)
CO2: 23 meq/L (ref 22–29)
Calcium: 8.6 mg/dL (ref 8.4–10.4)
Creatinine: 0.9 mg/dL (ref 0.6–1.1)
EGFR: 89 mL/min/{1.73_m2} — ABNORMAL LOW (ref >90)
GLUCOSE: 101 mg/dL (ref 70–140)
POTASSIUM: 3.8 meq/L (ref 3.5–5.1)
SODIUM: 142 meq/L (ref 136–145)
Total Bilirubin: <0.3 mg/dL (ref 0.20–1.20)
Total Protein: 6.5 g/dL (ref 6.4–8.3)

## 2015-08-06 LAB — CBC WITH DIFFERENTIAL/PLATELET
BASO%: 0.8 % (ref 0.0–2.0)
Basophils Absolute: 0 10e3/uL (ref 0.0–0.1)
EOS%: 1.1 % (ref 0.0–7.0)
Eosinophils Absolute: 0.1 10e3/uL (ref 0.0–0.5)
HCT: 35.7 % (ref 34.8–46.6)
HGB: 11.4 g/dL — ABNORMAL LOW (ref 11.6–15.9)
LYMPH%: 45.4 % (ref 14.0–49.7)
MCH: 27.1 pg (ref 25.1–34.0)
MCHC: 32 g/dL (ref 31.5–36.0)
MCV: 84.7 fL (ref 79.5–101.0)
MONO#: 0.5 10e3/uL (ref 0.1–0.9)
MONO%: 8.3 % (ref 0.0–14.0)
NEUT#: 2.8 10e3/uL (ref 1.5–6.5)
NEUT%: 44.4 % (ref 38.4–76.8)
Platelets: 204 10e3/uL (ref 145–400)
RBC: 4.22 10e6/uL (ref 3.70–5.45)
RDW: 15.8 % — ABNORMAL HIGH (ref 11.2–14.5)
WBC: 6.3 10e3/uL (ref 3.9–10.3)
lymph#: 2.9 10e3/uL (ref 0.9–3.3)

## 2015-08-06 MED ORDER — TAMOXIFEN CITRATE 20 MG PO TABS: 20.0000 mg | ORAL_TABLET | Freq: Every day | ORAL | 12 refills | Status: DC

## 2015-08-06 MED ORDER — GABAPENTIN 100 MG PO CAPS: 100.0000 mg | ORAL_CAPSULE | Freq: Every day | ORAL | 4 refills | Status: DC

## 2015-08-12 ENCOUNTER — Ambulatory Visit
Admission: RE | Admit: 2015-08-12 | Discharge: 2015-08-12 | Disposition: A | Discharge status: Home / Self Care | Payer: BLUE CROSS/BLUE SHIELD | Source: Ambulatory Visit | Attending: Oncology | Admitting: Oncology

## 2015-08-12 DIAGNOSIS — C50411 Malignant neoplasm of upper-outer quadrant of right female breast: Secondary | ICD-10-CM

## 2015-08-12 DIAGNOSIS — R928 Other abnormal and inconclusive findings on diagnostic imaging of breast: Secondary | ICD-10-CM | POA: Diagnosis not present

## 2015-09-25 DIAGNOSIS — Z113 Encounter for screening for infections with a predominantly sexual mode of transmission: Secondary | ICD-10-CM | POA: Diagnosis not present

## 2015-09-25 DIAGNOSIS — Z6841 Body Mass Index (BMI) 40.0 and over, adult: Secondary | ICD-10-CM | POA: Diagnosis not present

## 2015-09-25 DIAGNOSIS — Z01419 Encounter for gynecological examination (general) (routine) without abnormal findings: Secondary | ICD-10-CM | POA: Diagnosis not present

## 2015-09-25 DIAGNOSIS — Z114 Encounter for screening for human immunodeficiency virus [HIV]: Secondary | ICD-10-CM | POA: Diagnosis not present

## 2015-09-25 DIAGNOSIS — Z780 Asymptomatic menopausal state: Secondary | ICD-10-CM | POA: Diagnosis not present

## 2015-09-25 DIAGNOSIS — Z1159 Encounter for screening for other viral diseases: Secondary | ICD-10-CM | POA: Diagnosis not present

## 2015-10-21 DIAGNOSIS — N912 Amenorrhea, unspecified: Secondary | ICD-10-CM | POA: Diagnosis not present

## 2015-10-21 DIAGNOSIS — N926 Irregular menstruation, unspecified: Secondary | ICD-10-CM | POA: Diagnosis not present

## 2015-10-21 DIAGNOSIS — Z853 Personal history of malignant neoplasm of breast: Secondary | ICD-10-CM | POA: Diagnosis not present

## 2015-11-15 MED FILL — TAMOXIFEN 20 MG TABLET: 20 | 90 days supply | Qty: 90 | Fill #1

## 2015-11-18 MED FILL — SPIRONOLACTONE 25 MG TABLET: 25 | 30 days supply | Qty: 60 | Fill #0

## 2016-01-08 ENCOUNTER — Other Ambulatory Visit: Payer: Self-pay | Admitting: Nurse Practitioner

## 2016-02-28 MED FILL — TAMOXIFEN 20 MG TABLET: 20 | 90 days supply | Qty: 90 | Fill #2

## 2016-02-28 MED FILL — SPIRONOLACTONE 25 MG TABLET: 25 | 30 days supply | Qty: 60 | Fill #1

## 2016-05-29 ENCOUNTER — Telehealth: Payer: Self-pay

## 2016-05-29 NOTE — Telephone Encounter (Signed)
Pt had last herceptin 08/2014. 8 months after that her toe nails came off. Her 2 big toenails are coming off again. 2 days ago, one of them cracked and oozed clear liquid. The toenails are not laying flush against toes. She thinks there is another toenail growing "or something" lifting them up. They are tender. She is requesting referral to a podiatrist. One who is conversant with chemotherapy if possible.

## 2016-06-04 DIAGNOSIS — B351 Tinea unguium: Secondary | ICD-10-CM | POA: Diagnosis not present

## 2016-06-05 MED FILL — TERBINAFINE HCL 250 MG TAB: 250 | 30 days supply | Qty: 30 | Fill #0

## 2016-06-15 MED FILL — TAMOXIFEN 20 MG TABLET: 20 | 90 days supply | Qty: 90 | Fill #3

## 2016-06-15 MED FILL — SPIRONOLACTONE 25 MG TABLET: 25 | 30 days supply | Qty: 60 | Fill #2

## 2016-07-20 DIAGNOSIS — B351 Tinea unguium: Secondary | ICD-10-CM | POA: Diagnosis not present

## 2016-07-21 MED FILL — TERBINAFINE HCL 250 MG TAB: 250 | 30 days supply | Qty: 30 | Fill #0

## 2016-08-24 ENCOUNTER — Other Ambulatory Visit: Payer: Self-pay | Admitting: *Deleted

## 2016-08-24 DIAGNOSIS — C50411 Malignant neoplasm of upper-outer quadrant of right female breast: Secondary | ICD-10-CM

## 2016-08-25 ENCOUNTER — Other Ambulatory Visit (HOSPITAL_BASED_OUTPATIENT_CLINIC_OR_DEPARTMENT_OTHER): Payer: BLUE CROSS/BLUE SHIELD

## 2016-08-25 DIAGNOSIS — C50411 Malignant neoplasm of upper-outer quadrant of right female breast: Secondary | ICD-10-CM | POA: Diagnosis not present

## 2016-08-25 LAB — CBC WITH DIFFERENTIAL/PLATELET
BASO%: 0.6 % (ref 0.0–2.0)
Basophils Absolute: 0 10*3/uL (ref 0.0–0.1)
EOS%: 0.9 % (ref 0.0–7.0)
Eosinophils Absolute: 0.1 10*3/uL (ref 0.0–0.5)
HEMATOCRIT: 36.4 % (ref 34.8–46.6)
HEMOGLOBIN: 12 g/dL (ref 11.6–15.9)
LYMPH#: 2.1 10*3/uL (ref 0.9–3.3)
LYMPH%: 33.1 % (ref 14.0–49.7)
MCH: 27.5 pg (ref 25.1–34.0)
MCHC: 32.9 g/dL (ref 31.5–36.0)
MCV: 83.5 fL (ref 79.5–101.0)
MONO#: 0.5 10*3/uL (ref 0.1–0.9)
MONO%: 7.8 % (ref 0.0–14.0)
NEUT%: 57.6 % (ref 38.4–76.8)
NEUTROS ABS: 3.7 10*3/uL (ref 1.5–6.5)
PLATELETS: 270 10*3/uL (ref 145–400)
RBC: 4.36 10*6/uL (ref 3.70–5.45)
RDW: 16 % — AB (ref 11.2–14.5)
WBC: 6.4 10*3/uL (ref 3.9–10.3)

## 2016-08-25 LAB — COMPREHENSIVE METABOLIC PANEL
ALBUMIN: 3.2 g/dL — AB (ref 3.5–5.0)
ALT: 24 U/L (ref 0–55)
AST: 24 U/L (ref 5–34)
Alkaline Phosphatase: 78 U/L (ref 40–150)
Anion Gap: 10 mEq/L (ref 3–11)
BUN: 11.2 mg/dL (ref 7.0–26.0)
CALCIUM: 9.3 mg/dL (ref 8.4–10.4)
CHLORIDE: 107 meq/L (ref 98–109)
CO2: 24 mEq/L (ref 22–29)
CREATININE: 0.9 mg/dL (ref 0.6–1.1)
EGFR: >90 mL/min/{1.73_m2} (ref >90)
Glucose: 108 mg/dl (ref 70–140)
Potassium: 3.9 mEq/L (ref 3.5–5.1)
Sodium: 141 mEq/L (ref 136–145)
TOTAL PROTEIN: 6.9 g/dL (ref 6.4–8.3)

## 2016-09-01 ENCOUNTER — Ambulatory Visit (HOSPITAL_BASED_OUTPATIENT_CLINIC_OR_DEPARTMENT_OTHER): Payer: BLUE CROSS/BLUE SHIELD | Admitting: Oncology

## 2016-09-01 ENCOUNTER — Other Ambulatory Visit: Payer: Self-pay | Admitting: Oncology

## 2016-09-01 VITALS — BP 120/75 | HR 103 | Temp 99.3°F | Resp 18 | Ht 65.0 in | Wt 324.9 lb

## 2016-09-01 DIAGNOSIS — Z170 Estrogen receptor positive status [ER+]: Secondary | ICD-10-CM

## 2016-09-01 DIAGNOSIS — C50411 Malignant neoplasm of upper-outer quadrant of right female breast: Secondary | ICD-10-CM

## 2016-09-01 DIAGNOSIS — N898 Other specified noninflammatory disorders of vagina: Secondary | ICD-10-CM | POA: Diagnosis not present

## 2016-09-01 DIAGNOSIS — Z79810 Long term (current) use of selective estrogen receptor modulators (SERMs): Secondary | ICD-10-CM | POA: Diagnosis not present

## 2016-09-01 NOTE — Progress Notes (Signed)
[name]  Telephone:(336) [phone] Fax:(336) [phone]     ID: [name] DOB: 04/24/1970  MR#: [id number]  OJJ#:[id number]  Patient Care Team: Linda Pretty, MD as PCP - General (Internal Medicine) Linda Luna, MD as Consulting Physician (General Surgery) [name], Linda Dad, MD as Consulting Physician (Oncology) Linda Farrier, MD as Consulting Physician (Obstetrics and Gynecology)  CHIEF COMPLAINT:  triple positive breast cancer  CURRENT TREATMENT:  Tamoxifen  BREAST CANCER HISTORY: From the original intake note:  The patient herself palpated a mass in her right breast mid July. She brought it to Dr. Sherlynn [name] attention and he set the patient up for bilateral diagnostic mammography and right breast ultrasonography of the breast Center 07/28/2013. Right mammogram showed an irregular mass in the upper outer quadrant measuring 1.4 cm. There was some associated pleomorphic microcalcifications. This was palpable, at the 10:30 position 11 cm from the nipple. By palpation it measured approximately 1.5 cm. Ultrasound confirmed an hypoechoic mass with irregular borders measuring 1.1 cm. The right axilla showed a few adjacent deep lymph nodes with mild cortical thickening.  On 08/08/2013 the patient underwent biopsy of the breast mass in question. The pathology ([facility] [id number]) showed an invasive ductal carcinoma, grade 2, estrogen receptor 49% positive, progesterone receptor 47% positive, both with moderate staining intensity, with an MIB-1 of 52%, and HER-2 amplification with a signals ratio of 8.1. The right axillary lymph node biopsied at the same time was benign this was felt to be possibly concordant.  And 08/15/2013 the patient underwent bilateral breast MRI showing a 2 cm irregular enhancing mass in the upper outer quadrant of the right breast. There were no additional changes no abnormal appearing lymph nodes and no masses or abnormalities in the left breast.  The  patient's subsequent history is as detailed below  INTERVAL HISTORY: [name] returns today for follow-up of her estrogen receptor positive breast cancer. She continues on tamoxifen, with good tolerance. She has some hot flashes, usually more at night, there was during the day having dwindled. She has vaginal dryness, no vaginal discharge. She obtains a drug at a good price.   REVIEW OF SYSTEMS: [name] complains of fatigue and insomnia. She knows that if she exercised both those problems would improve. She works very hard and then when she gets home she does has no energy left. They have been no unusual headaches, visual changes, nausea, vomiting, cough, phlegm production, prostate, or change in bowel or bladder habits. Her periods never resumed after chemotherapy. A detailed review of systems today was stable  PAST MEDICAL HISTORY: Past Medical History:  Diagnosis Date  . GERD (gastroesophageal reflux disease)    years ago  . Kidney stones   . Malignant neoplasm of breast (female), unspecified site [facility])   . Poor circulation    in legs  . S/P radiation therapy 03/19/2014 through 05/03/2014    Right breast 4500 cGy in 25 sessions, right breast boost 1600 cGy in 8 sessions   . Sleep apnea    "borderline" - refused cpap  . Snores   . Wears contact lenses     PAST SURGICAL HISTORY: Past Surgical History:  Procedure Laterality Date  . AXILLARY SENTINEL NODE BIOPSY Right 01/25/2014   Procedure: RIGHT SENTINEL LYMPH NODE MAPPING;  Surgeon: Linda Luna, MD;  Location: [place];  Service: General;  Laterality: Right;  . BREAST LUMPECTOMY WITH RADIOACTIVE SEED LOCALIZATION Right 01/25/2014   Procedure: RIGHT BREAST LUMPECTOMY WITH RADIOACTIVE SEED LOCALIZATION ;  Surgeon:  Linda Luna, MD;  Location: [place];  Service: General;  Laterality: Right;  . PORT-A-CATH REMOVAL N/A 03/07/2015    Procedure: REMOVAL PORT-A-CATH;  Surgeon: Linda Luna, MD;  Location: [place];  Service: General;  Laterality: N/A;  . PORTACATH PLACEMENT N/A 08/23/2013   Procedure: INSERTION PORT-A-CATH WITH ULTRA SOUND;  Surgeon: Linda Faster. Cornett, MD;  Location: [place];  Service: General;  Laterality: N/A;  . WISDOM TOOTH EXTRACTION      FAMILY HISTORY Family History  Problem Relation Age of Onset  . Uterine cancer Mother 44       TAH/BSO  . Colon cancer Father 79       deceased 4  . Leukemia Maternal Grandmother        deceased 58  . Skin cancer Paternal Grandmother   . Prostate cancer Other    the patient's father died from colon cancer the age of 73. It had been diagnosed 3 years prior. The patient's mother was diagnosed with cancer of the uterus at age 54. She is 47 years old as of August 2015. The patient had no brothers or sisters. There is a history of prostate cancer leukemia at in the family as well as melanoma, but there is no history of breast or ovarian cancer in the family.  GYNECOLOGIC HISTORY:  No LMP recorded. Patient is not currently having periods (Reason: Chemotherapy). Menarche age 65. The patient is GX P0. She used oral contraceptives for several years remotely with no complications. She stopped having periods after first cycle of chemotherapy.  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 She lives alone, with no pets.    ADVANCED DIRECTIVES: Not in place. At her 08/16/2013 visit the patient was given the upper. Documents 2 complete and notarize so she may name her mother, [name] focal as her healthcare power of attorney, which is the patient's intention. Ms. focal can be reached at [phone]. She currently lives in [place] but is planning to move to [place]: Social History  Substance Use Topics  . Smoking status: Former Smoker    Quit date: 02/20/2013  . Smokeless tobacco: Never Used   . Alcohol use Yes     Comment: 1/week     Colonoscopy: Never  PAP: July 2015 Bone density: Never  Lipid panel:  Allergies  Allergen Reactions  . Amoxicillin Rash  . Chocolate Rash    Only Hershey's brand    Current Outpatient Prescriptions  Medication Sig Dispense Refill  . Calcium-Vitamin D-Vitamin K (CALCIUM SOFT CHEWS PO) Take 1 tablet by mouth daily.     . chlorpheniramine-HYDROcodone (TUSSIONEX) 10-8 MG/5ML SUER Take 5 mLs by mouth daily as needed for cough.   0  . gabapentin (NEURONTIN) 100 MG capsule Take 1-3 capsules (100-300 mg total) by mouth at bedtime. Take 1-3 capsules nightly for hotflashes 90 capsule 4  . Multiple Vitamins-Minerals (MULTI ADULT GUMMIES PO) Take 3-4 tablets by mouth daily.     Marland Kitchen spironolactone (ALDACTONE) 25 MG tablet 25 mg 2 (two) times daily.   0  . tamoxifen (NOLVADEX) 20 MG tablet Take 1 tablet (20 mg total) by mouth daily. 90 tablet 12   No current facility-administered medications for this visit.     OBJECTIVE: Middle-aged African American womanWho appears well Vitals:   09/01/16 1010  BP: 120/75  Pulse: (!) 103  Resp: 18  Temp: 99.3 F (37.4 C)  SpO2:  98%     Body mass index is 54.07 kg/m.     ECOG FS:0 - Asymptomatic   Sclerae unicteric, pupils round and equal Oropharynx clear and moist No cervical or supraclavicular adenopathy Lungs no rales or rhonchi Heart regular rate and rhythm Abd soft, nontender, positive bowel sounds MSK no focal spinal tenderness, no upper extremity lymphedema Neuro: nonfocal, well oriented, appropriate affect Breasts: The right breast is undergone lumpectomy and radiation with no evidence of local recurrence. The left breast is unremarkable. Both axillae are benign.   LAB RESULTS:  CMP     Component Value Date/Time   NA 141 08/25/2016 0829   K 3.9 08/25/2016 0829   CL 108 03/07/2015 1134   CO2 24 08/25/2016 0829   GLUCOSE 108 08/25/2016 0829   BUN 11.2 08/25/2016 0829   CREATININE 0.9  08/25/2016 0829   CALCIUM 9.3 08/25/2016 0829   PROT 6.9 08/25/2016 0829   ALBUMIN 3.2 (L) 08/25/2016 0829   AST 24 08/25/2016 0829   ALT 24 08/25/2016 0829   ALKPHOS 78 08/25/2016 0829   BILITOT <0.22 08/25/2016 0829   GFRNONAA >60 03/07/2015 1134   GFRAA >60 03/07/2015 1134    I No results found for: SPEP  Lab Results  Component Value Date   WBC 6.4 08/25/2016   NEUTROABS 3.7 08/25/2016   HGB 12.0 08/25/2016   HCT 36.4 08/25/2016   MCV 83.5 08/25/2016   PLT 270 08/25/2016      Chemistry      Component Value Date/Time   NA 141 08/25/2016 0829   K 3.9 08/25/2016 0829   CL 108 03/07/2015 1134   CO2 24 08/25/2016 0829   BUN 11.2 08/25/2016 0829   CREATININE 0.9 08/25/2016 0829      Component Value Date/Time   CALCIUM 9.3 08/25/2016 0829   ALKPHOS 78 08/25/2016 0829   AST 24 08/25/2016 0829   ALT 24 08/25/2016 0829   BILITOT <0.22 08/25/2016 0829       No results found for: LABCA2  No components found for: ENIDP824  No results for input(s): INR in the last 168 hours.  Urinalysis    Component Value Date/Time   COLORURINE ORANGE (A) 06/22/2014 0019    STUDIES: Is behind on mammography. I have gone ahead and schedule her for next week.  ASSESSMENT: 46 y.o. BRCA negative [place] woman status post right breast upper outer quadrant biopsy 08/08/2013 for a triple positive invasive ductal carcinoma, grade 2, with an MIB-1 of 52%, and a suspicious lymph node biopsied at the same time pathologically benign.  (1) genetics testing found no deleterious mutations in ATM, BARD1, BRCA1, BRCA2, BRIP1, CDH1, CHEK2, MRE11A, MUTYH, NBN, NF1, PALB2, PTEN, RAD50, RAD51C, RAD51D, and TP53  (2) neoadjuvant chemotherapy consisted of carboplatin, docetaxel, trastuzumab and pertuzumab for 6 cycles, completed 12/21/2013  (a) pertuzumab discontinued after cycle 1 because of severe diarrhea  (b) carboplatin switched for cyclophosphamide because of hypersensitivity reaction on  11/20/13  (c) docetaxel removed from final cycle because of peripheral neuropathy symptoms.  (3) trastuzumab continued to complete one year (through August 2016)  (a) repeat echocardiogram 06/25/2014 shows a well preserved ejection fraction  (4) status post right lumpectomy and sentinel lymph node sampling 01/25/2014 showing a residual pT1b pN0 invasive ductal carcinoma, grade 2, again HER-2 amplified  (5) adjuvant radiation completed 05/03/2014  (6) tamoxifen started May 20116  (7) consider donazepil study  PLAN: Saralee is now 2-1/2 years out from definitive surgery for her breast cancer with no  evidence of disease recurrence. This is very favorable.  She continues on tamoxifen with good tolerance.  She never developed the vaginal wetness patients can have with this drug. Instead she has severe vaginal dryness. I'm referring her to the intimacy and pelvic health program to see that helps her.  I strongly encouraged her to start a regular exercise program, goes low, but be very persistent.  Otherwise she will return to see me in one year, after her mammogram in August 2019. She knows to call for any problems that may develop before then.   Chauncey Cruel, MD 09/01/2016 10:17 AM

## 2016-09-16 ENCOUNTER — Encounter: Payer: Self-pay | Admitting: Physical Therapy

## 2016-09-16 ENCOUNTER — Ambulatory Visit: Payer: BLUE CROSS/BLUE SHIELD | Attending: Oncology | Admitting: Physical Therapy

## 2016-09-16 DIAGNOSIS — M6281 Muscle weakness (generalized): Secondary | ICD-10-CM | POA: Diagnosis not present

## 2016-09-16 MED FILL — TERBINAFINE HCL 250 MG TAB: 250 | 30 days supply | Qty: 30 | Fill #1

## 2016-09-16 MED FILL — SPIRONOLACTONE 25 MG TABLET: 25 | 30 days supply | Qty: 60 | Fill #3

## 2016-09-16 NOTE — Therapy (Signed)
[facility] [address] [place], [place], [address] Phone: [phone]   Fax:  [phone]  Physical Therapy Evaluation  Patient Details  Name: [name] MRN: [id number] Date of Birth: August 31, 1970 Referring Provider: Dr. Lurline Del  Encounter Date: 09/16/2016      PT End of Session - 09/16/16 1559    Visit Number 1   PT Start Time 0300   PT Stop Time 0355   PT Time Calculation (min) 55 min   Activity Tolerance Patient tolerated treatment well   Behavior During Therapy [facility] for tasks assessed/performed      Past Medical History:  Diagnosis Date  . GERD (gastroesophageal reflux disease)    years ago  . Kidney stones   . Malignant neoplasm of breast (female), unspecified site   . Poor circulation    in legs  . S/P radiation therapy 03/19/2014 through 05/03/2014    Right breast 4500 cGy in 25 sessions, right breast boost 1600 cGy in 8 sessions   . Sleep apnea    "borderline" - refused cpap  . Snores   . Wears contact lenses     Past Surgical History:  Procedure Laterality Date  . AXILLARY SENTINEL NODE BIOPSY Right 01/25/2014   Procedure: RIGHT SENTINEL LYMPH NODE MAPPING;  Surgeon: Erroll Luna, MD;  Location: [place];  Service: General;  Laterality: Right;  . BREAST LUMPECTOMY WITH RADIOACTIVE SEED LOCALIZATION Right 01/25/2014   Procedure: RIGHT BREAST LUMPECTOMY WITH RADIOACTIVE SEED LOCALIZATION ;  Surgeon: Erroll Luna, MD;  Location: [place];  Service: General;  Laterality: Right;  . PORT-A-CATH REMOVAL N/A 03/07/2015   Procedure: REMOVAL PORT-A-CATH;  Surgeon: Erroll Luna, MD;  Location: [place];  Service: General;  Laterality: N/A;  . PORTACATH PLACEMENT N/A 08/23/2013   Procedure: INSERTION PORT-A-CATH WITH ULTRA SOUND;  Surgeon: Joyice Faster. [place], MD;  Location: [place];  Service: General;  Laterality: N/A;  . WISDOM TOOTH EXTRACTION      There were no vitals filed for this visit.       Subjective Assessment - 09/16/16 1506    Subjective Patient reports she has vaginal dryness.  Patient has dryness in the vaginal area.  Sex drive is down.     Patient Stated Goals educate on vaginal dryness   Currently in Pain? No/denies   Multiple Pain Sites No            [facility] PT Assessment - 09/16/16 0001      Assessment   Medical Diagnosis C50.411, Z17.0 Malignant neoplasm of upper-outer quadrant of reight breast in female, estrogen receptor positive   Referring Provider Dr. Sarajane Jews Magrinat   Onset Date/Surgical Date 08/08/13   Prior Therapy none     Precautions   Precautions Other (comment)   Precaution Comments breast cancer     Restrictions   Weight Bearing Restrictions No     Balance Screen   Has the patient fallen in the past 6 months No   Has the patient had a decrease in activity level because of a fear of falling?  No   Is the patient reluctant to leave their home because of a fear of falling?  No     Home Ecologist residence     Prior Function   Level of Independence Independent   Vocation Full time employment   Vocation Requirements programming with sitting   Leisure no exercise  Cognition   Overall Cognitive Status Within Functional Limits for tasks assessed     Posture/Postural Control   Posture/Postural Control No significant limitations     ROM / Strength   AROM / PROM / Strength AROM;PROM;Strength     AROM   Overall AROM Comments full lumbar ROM     Palpation   SI assessment  pelvis in correct alignment            Objective measurements completed on examination: See above findings.        Pelvic Floor Special Questions - 09/16/16 0001    Currently Sexually Active No   Urinary Leakage No   Skin Integrity Intact  dryness   Pelvic Floor Internal Exam Patient  confirms identification and approves PT to assess pelvic floor muscle integrity   Exam Type Vaginal   Palpation no tenderness   Strength good squeeze, good lift, able to hold agaisnt strong resistance                  PT Education - 09/16/16 1531    Education provided Yes   Education Details vaginal lubricants and moisturizers; stretches; pelvic floor contraction; gave patient samples of vaginal lubricants and moisturizers   Person(s) Educated Patient   Methods Explanation;Demonstration;Verbal cues;Handout   Comprehension Returned demonstration;Verbalized understanding             PT Long Term Goals - 09/16/16 1605      PT LONG TERM GOAL #1   Title understand the importance of vaginal moisturizers and lubricants to manage her vaginal dryness   Time 1   Period Days   Status Achieved   Target Date 09/16/16     PT LONG TERM GOAL #2   Title understand how to take care of her vaginal area with cleaning, ways to air out while sleeping, and no douching    Time 1   Period Days   Status Achieved   Target Date 09/16/16     PT LONG TERM GOAL #3   Title understand how to contract the vaginal muscles to improve blood flow and maintain strength   Time 1   Period Days   Status Achieved   Target Date 09/16/16                Plan - 09/16/16 1559    Clinical Impression Statement Patient is a 46 year old female s/P breast cancer that is triple positive.  Patient is presently taking Tamoxifen and reports severe vaginal dryness.  Patient reports no pain.   Perineal area is dry with good coloring.  Her clitoris is enlarged.  Patient is able to contract her pelvic floor with a lift at 4/5 strength level.  Patient has good lower extremity strength.  Patient is not having intercourse at this time but is nervous for the future due to dryness and discomfort.  Patient will benefit from a 1 time session to educate on vaginal health and how to moisturize and lubrication  to reduce  vaginal dryness.    History and Personal Factors relevant to plan of care: Breast Cancer triple positive 08/08/2013 with radiactive seed location; Presently taking Tamoxifin   Clinical Presentation Stable   Clinical Presentation due to: stable condition   Clinical Decision Making Low   Rehab Potential Excellent   Clinical Impairments Affecting Rehab Potential Breast Cancer triple positive 08/08/2013 with radiactive seed location; Presently taking Tamoxifin   PT Frequency One time visit   PT Duration Other (comment)  1  time visit then discharge   PT Treatment/Interventions Patient/family education   PT Next Visit Plan dischage to HEP with education on vaginal moisturizers and lubricants, pelvic floor exercise, stretches   PT Home Exercise Plan Current HEP   Consulted and Agree with Plan of Care Patient      Patient will benefit from skilled therapeutic intervention in order to improve the following deficits and impairments:  Other (comment) (vaginal dryness)  Visit Diagnosis: Muscle weakness (generalized) - Plan: PT plan of care cert/re-cert     Problem List Patient Active Problem List   Diagnosis Date Noted  . Obesity, morbid, BMI 50 or higher ([place]) 08/06/2015  . Genetic testing 11/16/2014  . Hot flashes 06/14/2014  . Ankle edema 06/14/2014  . Chemotherapy-induced neuropathy ([place]) 12/06/2013  . Hypersensitivity reaction 12/01/2013  . Insomnia 10/19/2013  . Chemotherapy-induced nausea 10/05/2013  . Irregular bowel habits 10/05/2013  . Palpitations 10/03/2013  . Steroid-induced hyperglycemia 09/28/2013  . Diarrhea 09/14/2013  . Hemorrhoids 09/14/2013  . Skin abnormalities 09/14/2013  . Malignant neoplasm of upper-outer quadrant of right breast in female, estrogen receptor positive ([place]) 08/11/2013    Earlie Counts, PT 09/16/16 4:11 PM   [facility] [address], [place], [place], [address] Phone: [phone]    Fax:  [phone]  Name: [name] MRN: [id number] Date of Birth: 10-27-1970

## 2016-09-16 NOTE — Patient Instructions (Addendum)
Moisturizers . They are used in the vagina to hydrate the mucous membrane that make up the vaginal canal. . Designed to keep a more normal acid balance (ph) . Once placed in the vagina, it will last between two to three days.  . Use 2-3 times per week at bedtime and last longer than 60 min. . Ingredients to avoid is glycerin and fragrance, can increase chance of infection . Should not be used just before sex due to causing irritation . Most are gels administered either in a tampon-shaped applicator or as a vaginal suppository. They are non-hormonal.   Types of Moisturizers . Samul Dada- drug store . Vitamin E vaginal suppositories- Whole foods . Moist Again . Coconut oil- can break down condoms . Michail Jewels . V-magic - cream external for the vulva area . Yes moisturizer- Eminence Glide    Things to avoid in the vaginal area . Do not use things to irritate the vulvar area . No lotions just specialized creams for the vulva area- Neogyn, V-magic, [place] . No soaps; can use Aveeno or Calendula cleanser if needed. Must be gentle . No deodorants . No douches . Good to sleep without underwear to let the vaginal area to air out . No scrubbing: spread the lips to let warm water rinse over labias and pat dry  Lubrication . Used for intercourse to reduce friction . Avoid ones that have glycerin, warming gels, tingling gels, icing or cooling gel, scented . Avoid parabens due to a preservative similar to female sex hormone . May need to be reapplied once or several times during sexual activity . Can be applied to both partners genitals prior to vaginal penetration to minimize friction or irritation . Prevent irritation and mucosal tears that cause post coital pain and increased the risk of vaginal and urinary tract infections . Oil-based lubricants cannot be used with condoms due to breaking them down.  Least likely to irritate vaginal tissue.   . Plant based-lubes are safe . Silicone-based lubrication are thicker and last long and used for post-menopausal women  Vaginal Lubricators Here is a list of some suggested lubricators you can use for intercourse. Use the most hypoallergenic product.  You can place on you or your partner.    Good Clean Love -CVS,Target, [employer], [employer] (water based)  Slippery Stuff  Sylk, Sliquid Natural H2O ( good  if frequent UTI's)  Blossom Organics ([email or web address]-organics.com)  Samul Dada, Coconut oil  PJur Woman Nude- water based lubricant, [place]  Yes lubricant- [employer]   . Wet Platinum-Silicone, Target, Walgreens Things to avoid in lubricants are glycerin, warming gels, tingling gels, icing or cooling  gels, and scented gels.  Also avoid Vaseline. KY jelly, Replens, and Astroglide kills good bacteria(lactobacilli)  Things to avoid in the vaginal area . Do not use things to irritate the vulvar area . No lotions . No soaps; can use Aveeno or Calendula cleanser if needed. Must be gentle . No deodorants . No douches . Good to sleep without underwear to let the vaginal area to air out . No scrubbing: spread the lips to let warm water rinse over labias and pat dry   Butterfly, Supine    Lie on back, feet together. Lower knees toward floor. Hold __30_ seconds. Repeat _2__ times per session. Do _1__ sessions per day.  Copyright  VHI. All rights reserved.  Chair Sitting    Sit at edge  of seat, spine straight, one leg extended. Put a hand on each thigh and bend forward from the hip, keeping spine straight. Allow hand on extended leg to reach toward toes. Support upper body with other arm. Hold _30__ seconds. Repeat _2__ times per session. Do 1___ sessions per day.  Copyright  VHI. All rights reserved.  Piriformis Stretch, Sitting    Sit, one ankle on opposite knee, same-side hand on crossed knee. Push down on knee, keeping spine  straight. Lean torso forward, with flat back, until tension is felt in hamstrings and gluteals of crossed-leg side. Hold _30__ seconds.  Repeat _2__ times per session. Do _1__ sessions per day.  Copyright  VHI. All rights reserved.  Quick Contraction: Gravity Resisted (Sitting)    Sitting, quickly squeeze then fully relax pelvic floor. Perform _1__ sets of _5__. Rest for __1_ seconds between sets. Do _3__ times a day.  Copyright  VHI. All rights reserved.  Slow Contraction: Gravity Resisted (Sitting)    Sitting, slowly squeeze pelvic floor for _10__ seconds. Rest for __5_ seconds. Repeat _10__ times. Do __3_ times a day.  Copyright  VHI. All rights reserved.  [facility] [address] [place], [place] [address] Phone # [phone] Fax [phone] [email or web address]

## 2016-10-12 ENCOUNTER — Ambulatory Visit
Admission: RE | Admit: 2016-10-12 | Discharge: 2016-10-12 | Disposition: A | Discharge status: Home / Self Care | Payer: BLUE CROSS/BLUE SHIELD | Source: Ambulatory Visit | Attending: Oncology | Admitting: Oncology

## 2016-10-12 DIAGNOSIS — C50411 Malignant neoplasm of upper-outer quadrant of right female breast: Secondary | ICD-10-CM

## 2016-10-12 DIAGNOSIS — Z170 Estrogen receptor positive status [ER+]: Secondary | ICD-10-CM

## 2016-10-12 DIAGNOSIS — R928 Other abnormal and inconclusive findings on diagnostic imaging of breast: Secondary | ICD-10-CM | POA: Diagnosis not present

## 2016-10-13 ENCOUNTER — Encounter: Payer: Self-pay | Admitting: Oncology

## 2016-10-22 DIAGNOSIS — B351 Tinea unguium: Secondary | ICD-10-CM | POA: Diagnosis not present

## 2016-12-31 ENCOUNTER — Other Ambulatory Visit: Payer: Self-pay | Admitting: Oncology

## 2016-12-31 DIAGNOSIS — C50411 Malignant neoplasm of upper-outer quadrant of right female breast: Secondary | ICD-10-CM

## 2016-12-31 MED FILL — TAMOXIFEN CITRATE 20 MG TAB: 20 | 90 days supply | Qty: 90 | Fill #0

## 2017-01-01 MED FILL — SPIRONOLACTONE 25 MG TABLET: 25 | 30 days supply | Qty: 60 | Fill #0

## 2017-01-21 DIAGNOSIS — Z0001 Encounter for general adult medical examination with abnormal findings: Secondary | ICD-10-CM | POA: Diagnosis not present

## 2017-01-21 DIAGNOSIS — I890 Lymphedema, not elsewhere classified: Secondary | ICD-10-CM | POA: Diagnosis not present

## 2017-01-21 DIAGNOSIS — G4733 Obstructive sleep apnea (adult) (pediatric): Secondary | ICD-10-CM | POA: Diagnosis not present

## 2017-01-21 DIAGNOSIS — B351 Tinea unguium: Secondary | ICD-10-CM | POA: Diagnosis not present

## 2017-01-21 DIAGNOSIS — L719 Rosacea, unspecified: Secondary | ICD-10-CM | POA: Diagnosis not present

## 2017-01-21 DIAGNOSIS — Z880 Allergy status to penicillin: Secondary | ICD-10-CM | POA: Diagnosis not present

## 2017-01-21 DIAGNOSIS — Z0000 Encounter for general adult medical examination without abnormal findings: Secondary | ICD-10-CM | POA: Diagnosis not present

## 2017-02-04 ENCOUNTER — Institutional Professional Consult (permissible substitution): Payer: Self-pay | Admitting: Neurology

## 2017-02-26 ENCOUNTER — Other Ambulatory Visit: Payer: Self-pay | Admitting: Gastroenterology

## 2017-02-26 DIAGNOSIS — Z800 Family history of malignant neoplasm of digestive organs: Secondary | ICD-10-CM | POA: Diagnosis not present

## 2017-02-26 DIAGNOSIS — Z01818 Encounter for other preprocedural examination: Secondary | ICD-10-CM | POA: Diagnosis not present

## 2017-02-26 DIAGNOSIS — Z1211 Encounter for screening for malignant neoplasm of colon: Secondary | ICD-10-CM | POA: Diagnosis not present

## 2017-03-17 ENCOUNTER — Other Ambulatory Visit: Payer: Self-pay

## 2017-03-17 ENCOUNTER — Encounter (HOSPITAL_COMMUNITY): Payer: Self-pay | Admitting: Emergency Medicine

## 2017-03-18 MED FILL — GAVILYTE-N SOLUTION: 420 | 2 days supply | Qty: 4000 | Fill #0

## 2017-03-22 NOTE — H&P (Signed)
History of Present Illness  General:  47/female was referred for family history of colon cancer in her father(79years). No prior colonoscopy, reports having a flexible sigmoidoscopy many years ago. She was supposed to have a high risk screening colonoscopy at 62, was diagnosed with breast cancer, status post mastectomy chemotherapy and radiation and seems to be in remission. She reports change in bowel habit with change in diet but denies diarrhea or constipation regular basis. She denies blood in stool or black stools. She denies abdominal pain, nausea, vomiting, acid reflux, difficulty swallowing, pain on swallowing, early satiety or bloating.   Current Medications  Taking   Tamoxifen Citrate 20 MG Tablet 1 tablet Orally Once a day   Spironolactone 25 MG Tablet 1 tablet Orally   Medication List reviewed and reconciled with the patient    Past Medical History  Breast cancer ,s/p chemo and radiation.   Fracture of clavicle.   Leg edema.    Surgical History  portacath placement, removal   mastectomy partial    Family History  Father: deceased  Mother: alive  Paternal Grand Father: deceased  Paternal Grand Mother: deceased  Maternal Grand Father: deceased  Maternal Grand Mother: deceased  No Family History of Polyps, or Liver Disease.   Social History  General:  Tobacco use  cigarettes: Former smoker Tobacco history last updated 02/04/2017 no EXPOSURE TO PASSIVE SMOKE.  Alcohol: yes, once a month.  Caffeine: yes, coffee, 1 serving daily.  no Recreational drug use.    Allergies  Amoxicillin: rash - Allergy  Hersheys chocolate: rash/swelling - Allergy   Hospitalization/Major Diagnostic Procedure  Not in the past year 02/2017   Review of Systems  CONSTITUTIONAL:  Chills No. Fatigue No. Fever No. Insomnia No. Night sweats No. Weight change No.  HEENT:  Change in vision No. Double vision No . Hoarseness No. Nose bleeds No. sore throat No. Sinus Problems No. Glaucoma  No.  CARDIOLOGY:  ByPass Surgery No. Poor Circulation No. Artificial Heart Valves No. High blood pressure No. History of Heart attack No. Irregular heart beat No. Known coronary artery disease No. Pacemaker/Defibrillator No.  RESPIRATORY:  Shortness of breath No. Cough No. Excessive Sputum No. Using Oxygen No. Asthma No. Bronchitis No. Pneumonia No. Sleep Apnea No.  UROLOGY:  Interstitials Cystitis No. Incontinence No. Blood in urine No. Difficulty urinating No. Kidney disease No. Kidney stones YES.  GASTROENTEROLOGY:  Abdominal pain No. Acid reflux No. Black stools No. Bloating/belching No. Change in bowel habits YES. Constipation No. Dark tarry stools No. Diarrhea No. Difficulty swallowing No. Heartburn No. Incontinence of stool No. Indigestion: No. Lactose intolerance YES. Nausea No. Rectal bleeding No. Vomiting No. Hepatitis/yellow jaundice No. History of Ulcers No. Use of Pain Medication No. Previous Colonoscopy No.  MUSCULOSKELETAL:  joint pain YES. Arthritis No. Joint Replacement No.  NEUROLOGY:  Dizziness No. Fainting No. Headache No. Paralysis No. Seizures No. Strokes No. Weakness No. Alzheimer's No.  SKIN:  Rash YES. Bruises easily No.  ENDOCRINOLOGY:  Diabetes No. High cholesterol No. Thyroid disorder No.  HEMATOLOGY/LYMPH:  Abnormal bleeding No. Anemia No. Enlarged lymph nodes No. Past blood transfusion No. Swollen glands No. Blood Clots No. Using Blood Thinners No.  INFECTIOUS DISEASE:  HIV/AIDS No. Tuberculosis No . Hepatitis No. Sexually Transmitted Disease No. MRSA No.  GI PROCEDURE:  no Pacemaker/ AICD, no. no Artificial heart valves. no MI/heart attack. no Abnormal heart rhythm. no Angina. no CVA. no Hypertension. no Hypotension. no Asthma, COPD. no Sleep apnea. no Seizure disorders. no Artificial  joints. no Severe DJD. no Diabetes. no Significant headaches. no Vertigo. no Depression/anxiety. no Abnormal bleeding. no Kidney Disease. no Liver disease, no. no Chance of  pregnancy. no Blood transfusion. no Method of Birth Control. no Birth control pills.  GU/GYN:  Pregnant Now No. Trying to conceive No. Use Birth Control No. Heavy Periods No.       Vital Signs  Wt 326, Ht 65.5, BMI 53.42, Temp 97.2, Pulse sitting 88, BP sitting 119/88.   Examination  Gastroenterology:: GENERAL APPEARANCE: Well developed, morbidly obese, no active distress, pleasant, no acute distress.  EYES: Lids and conjunctiva normal. Sclera normal, pupils equal and reactive .  ORAL CAVITY: Lips, teeth and gums are normal. Pharynx, tongue, mucosa normal .  SCLERA: anicteric .  NECK Full ROM, trachea midline, no thyromegaly or masses .  CARDIOVASCULAR PMI LS border. Normal RRR w/o murmers or gallops. No peripheral edema .  RESPIRATORY Breath sounds normal. Respiration even and unlabored .  ABDOMEN No masses palpated. Liver and spleen not palpated, normal. Bowel sounds normal, Abdomen not distended .  EXTREMITIES: No edema, pulses intact .  NEURO: normal strength and reflexes, cranial nerves II-XII grossly intact, normal gait .  PSYCH: mood/affect normal .     Assessments   1. Family history of colon cancer in father - Z80.0 (Primary)   Treatment  1. Family history of colon cancer in father  IMAGING: Colonoscopy Notes: The risk and benefits of the procedure were explained to the patient in details. She understands and verbalizes consent. She will be given written instructions, prescription for her preparation and will be scheduled for the same.Marland Kitchen

## 2017-03-23 ENCOUNTER — Encounter (HOSPITAL_COMMUNITY)
Admission: RE | Disposition: A | Discharge status: Home / Self Care | Payer: Self-pay | Source: Ambulatory Visit | Attending: Gastroenterology

## 2017-03-23 ENCOUNTER — Ambulatory Visit (HOSPITAL_COMMUNITY): Payer: BLUE CROSS/BLUE SHIELD | Admitting: Anesthesiology

## 2017-03-23 ENCOUNTER — Encounter (HOSPITAL_COMMUNITY): Payer: Self-pay

## 2017-03-23 ENCOUNTER — Other Ambulatory Visit: Payer: Self-pay

## 2017-03-23 ENCOUNTER — Ambulatory Visit (HOSPITAL_COMMUNITY)
Admission: RE | Admit: 2017-03-23 | Discharge: 2017-03-23 | Disposition: A | Discharge status: Home / Self Care | Payer: BLUE CROSS/BLUE SHIELD | Source: Ambulatory Visit | Attending: Gastroenterology | Admitting: Gastroenterology

## 2017-03-23 DIAGNOSIS — Z1211 Encounter for screening for malignant neoplasm of colon: Secondary | ICD-10-CM | POA: Insufficient documentation

## 2017-03-23 DIAGNOSIS — C50411 Malignant neoplasm of upper-outer quadrant of right female breast: Secondary | ICD-10-CM | POA: Diagnosis not present

## 2017-03-23 DIAGNOSIS — Z91018 Allergy to other foods: Secondary | ICD-10-CM | POA: Insufficient documentation

## 2017-03-23 DIAGNOSIS — K644 Residual hemorrhoidal skin tags: Secondary | ICD-10-CM | POA: Diagnosis not present

## 2017-03-23 DIAGNOSIS — Z800 Family history of malignant neoplasm of digestive organs: Secondary | ICD-10-CM | POA: Diagnosis not present

## 2017-03-23 DIAGNOSIS — Z79810 Long term (current) use of selective estrogen receptor modulators (SERMs): Secondary | ICD-10-CM | POA: Diagnosis not present

## 2017-03-23 DIAGNOSIS — Z923 Personal history of irradiation: Secondary | ICD-10-CM | POA: Insufficient documentation

## 2017-03-23 DIAGNOSIS — K648 Other hemorrhoids: Secondary | ICD-10-CM | POA: Diagnosis not present

## 2017-03-23 DIAGNOSIS — Z79899 Other long term (current) drug therapy: Secondary | ICD-10-CM | POA: Diagnosis not present

## 2017-03-23 DIAGNOSIS — Z880 Allergy status to penicillin: Secondary | ICD-10-CM | POA: Diagnosis not present

## 2017-03-23 DIAGNOSIS — Z6841 Body Mass Index (BMI) 40.0 and over, adult: Secondary | ICD-10-CM | POA: Diagnosis not present

## 2017-03-23 DIAGNOSIS — Z853 Personal history of malignant neoplasm of breast: Secondary | ICD-10-CM | POA: Diagnosis not present

## 2017-03-23 DIAGNOSIS — Z87891 Personal history of nicotine dependence: Secondary | ICD-10-CM | POA: Insufficient documentation

## 2017-03-23 DIAGNOSIS — Z9221 Personal history of antineoplastic chemotherapy: Secondary | ICD-10-CM | POA: Diagnosis not present

## 2017-03-23 HISTORY — DX: Personal history of urinary calculi: Z87.442

## 2017-03-23 HISTORY — PX: COLONOSCOPY: SHX5424

## 2017-03-23 SURGERY — COLONOSCOPY
Anesthesia: Monitor Anesthesia Care

## 2017-03-23 MED ORDER — LIDOCAINE 2% (20 MG/ML) 5 ML SYRINGE
INTRAMUSCULAR | Status: DC | PRN
  Administered 2017-03-23: 60 mg via INTRAVENOUS

## 2017-03-23 MED ORDER — KETAMINE HCL 10 MG/ML IJ SOLN
INTRAMUSCULAR | Status: AC
  Filled 2017-03-23: qty 1

## 2017-03-23 MED ORDER — ONDANSETRON HCL 4 MG/2ML IJ SOLN: INTRAMUSCULAR | Status: DC | PRN

## 2017-03-23 MED ORDER — SODIUM CHLORIDE 0.9 % IV SOLN: INTRAVENOUS | Status: DC

## 2017-03-23 MED ORDER — LACTATED RINGERS IV SOLN
INTRAVENOUS | Status: DC
  Administered 2017-03-23: 13:00:00 via INTRAVENOUS
  Administered 2017-03-23: 1000 mL via INTRAVENOUS

## 2017-03-23 MED ORDER — PROPOFOL 10 MG/ML IV BOLUS
INTRAVENOUS | Status: AC
  Filled 2017-03-23: qty 40

## 2017-03-23 MED ORDER — PROPOFOL 10 MG/ML IV BOLUS
INTRAVENOUS | Status: DC | PRN
  Administered 2017-03-23: 20 mg via INTRAVENOUS
  Administered 2017-03-23 (×6): 50 mg via INTRAVENOUS

## 2017-03-23 NOTE — Op Note (Signed)
Colonoscopy was performed as high risk screening for colon cancer, her father had colon cancer at 71. No prior colonoscopy.  Findings: External hemorrhoids noted. Otherwise the entire colon, terminal ileum and retroflexion was unremarkable.  Recommendations: Reassurance. Repeat colonoscopy for high risk screening every 5 years.  [name], M.D.

## 2017-03-23 NOTE — Interval H&P Note (Signed)
History and Physical Interval Note: 47/female for high risk screening colonoscopy.  03/23/2017 11:51 AM  [name]  has presented today for colonoscopy, with the diagnosis of Family history of colon cancer in father  The various methods of treatment have been discussed with the patient and family. After consideration of risks, benefits and other options for treatment, the patient has consented to  Procedure(s): COLONOSCOPY (N/A) as a surgical intervention .  The patient's history has been reviewed, patient examined, no change in status, stable for surgery.  I have reviewed the patient's chart and labs.  Questions were answered to the patient's satisfaction.     [name]

## 2017-03-23 NOTE — Brief Op Note (Signed)
03/23/2017  1:02 PM  PATIENT:  [name]  47 y.o. female  PRE-OPERATIVE DIAGNOSIS:  Family history of colon cancer in father  POST-OPERATIVE DIAGNOSIS:  normal  PROCEDURE:  Procedure(s): COLONOSCOPY (N/A)  SURGEON:  Surgeon(s) and Role:    * [name], MD - Primary  PHYSICIAN ASSISTANT:   ASSISTANTS: Cleda Daub, RN, Alan Mulder, Tech  ANESTHESIA:   MAC  EBL:  0 mL   BLOOD ADMINISTERED:none  DRAINS: none   LOCAL MEDICATIONS USED:  NONE  SPECIMEN:  No Specimen  DISPOSITION OF SPECIMEN:  N/A  COUNTS:  YES  TOURNIQUET:  * No tourniquets in log *  DICTATION: .Dragon Dictation  PLAN OF CARE: Discharge to home after PACU  PATIENT DISPOSITION:  PACU - hemodynamically stable.   Delay start of Pharmacological VTE agent (>24hrs) due to surgical blood loss or risk of bleeding: no

## 2017-03-23 NOTE — Anesthesia Postprocedure Evaluation (Signed)
Anesthesia Post Note  Patient: [name]  Procedure(s) Performed: COLONOSCOPY (N/A )     Patient location during evaluation: PACU Anesthesia Type: MAC Level of consciousness: awake and alert Pain management: pain level controlled Vital Signs Assessment: post-procedure vital signs reviewed and stable Respiratory status: spontaneous breathing, nonlabored ventilation, respiratory function stable and patient connected to nasal cannula oxygen Cardiovascular status: stable and blood pressure returned to baseline Postop Assessment: no apparent nausea or vomiting Anesthetic complications: no    Last Vitals:  Vitals:   03/23/17 1309 03/23/17 1310  BP:    Pulse: 71   Resp: (!) 24   Temp:  36.5 C  SpO2: 100%     Last Pain:  Vitals:   03/23/17 1310  TempSrc: Oral                 Daphna Lafuente S

## 2017-03-23 NOTE — Op Note (Signed)
[facility] Patient Name: [name] Procedure Date: 03/23/2017 MRN: [id number] Attending MD: [name] , MD Date of Birth: 1970-05-04 CSN: [id number] Age: 47 Admit Type: Inpatient Procedure:                Colonoscopy Indications:              This is the patient's first colonoscopy, Family                            history of colon cancer in a first-degree                            relative(father) Providers:                [name], MD, Cleda Daub, RN, Alan Mulder,                            Technician, Glenis Smoker, CRNA Referring MD:              Medicines:                Monitored Anesthesia Care Complications:            No immediate complications. Estimated Blood Loss:     Estimated blood loss: none. Procedure:                Pre-Anesthesia Assessment:                           - Prior to the procedure, a History and Physical                            was performed, and patient medications and                            allergies were reviewed. The patient's tolerance of                            previous anesthesia was also reviewed. The risks                            and benefits of the procedure and the sedation                            options and risks were discussed with the patient.                            All questions were answered, and informed consent                            was obtained. Prior Anticoagulants: The patient has                            taken no previous anticoagulant or antiplatelet                            agents. ASA Grade  Assessment: III - A patient with                            severe systemic disease. After reviewing the risks                            and benefits, the patient was deemed in                            satisfactory condition to undergo the procedure.                           After obtaining informed consent, the colonoscope                            was passed under direct vision.  Throughout the                            procedure, the patient's blood pressure, pulse, and                            oxygen saturations were monitored continuously. The                            EC-[id number] ([id number]) scope was introduced through                            the anus and advanced to the the terminal ileum.                            The colonoscopy was performed without difficulty.                            The patient tolerated the procedure well. The                            quality of the bowel preparation was good. The                            terminal ileum, ileocecal valve, appendiceal                            orifice, and rectum were photographed. Scope In: 12:43:15 PM Scope Out: 1:00:30 PM Scope Withdrawal Time: 0 hours 12 minutes 5 seconds  Total Procedure Duration: 0 hours 17 minutes 15 seconds  Findings:      The perianal exam findings include non-thrombosed external hemorrhoids.      The colon (entire examined portion) appeared normal.      The terminal ileum appeared normal.      The retroflexed view of the distal rectum and anal verge was normal and       showed no anal or rectal abnormalities. Impression:               - Non-thrombosed external hemorrhoids found on  perianal exam.                           - The entire examined colon is normal.                           - The examined portion of the ileum was normal.                           - The distal rectum and anal verge are normal on                            retroflexion view.                           - No specimens collected. Moderate Sedation:      Patient did not receive moderate sedation for this procedure, but       instead received monitored anesthesia care. Recommendation:           - Patient has a contact number available for                            emergencies. The signs and symptoms of potential                            delayed complications were  discussed with the                            patient. Return to normal activities tomorrow.                            Written discharge instructions were provided to the                            patient.                           - Resume regular diet.                           - Continue present medications.                           - Repeat colonoscopy in 5 years for screening                            purposes. Procedure Code(s):        --- Professional ---                           [phone], Colonoscopy, flexible; diagnostic, including                            collection of specimen(s) by brushing or washing,  when performed (separate procedure) Diagnosis Code(s):        --- Professional ---                           K64.4, Residual hemorrhoidal skin tags                           Z80.0, Family history of malignant neoplasm of                            digestive organs CPT copyright 2016 [employer]. All rights reserved. The codes documented in this report are preliminary and upon coder review may  be revised to meet current compliance requirements. [name], MD 03/23/2017 1:02:15 PM This report has been signed electronically. Number of Addenda: 0

## 2017-03-23 NOTE — Anesthesia Preprocedure Evaluation (Addendum)
Anesthesia Evaluation  Patient identified by MRN, date of birth, ID band Patient awake    Reviewed: Allergy & Precautions, NPO status , Patient's Chart, lab work & pertinent test results  Airway Mallampati: II  TM Distance: <3 FB Neck ROM: Full    Dental no notable dental hx. (+) Dental Advisory Given   Pulmonary neg pulmonary ROS, former smoker,    Pulmonary exam normal breath sounds clear to auscultation + decreased breath sounds      Cardiovascular Normal cardiovascular exam Rhythm:Regular Rate:Normal     Neuro/Psych negative neurological ROS  negative psych ROS   GI/Hepatic Neg liver ROS, GERD  ,  Endo/Other  Morbid obesity  Renal/GU negative Renal ROS  negative genitourinary   Musculoskeletal negative musculoskeletal ROS (+)   Abdominal (+) + obese,   Peds negative pediatric ROS (+)  Hematology negative hematology ROS (+)   Anesthesia Other Findings   Reproductive/Obstetrics negative OB ROS                            Anesthesia Physical Anesthesia Plan  ASA: III  Anesthesia Plan: MAC   Post-op Pain Management:    Induction: Intravenous  PONV Risk Score and Plan: 0  Airway Management Planned: Simple Face Mask  Additional Equipment:   Intra-op Plan:   Post-operative Plan:   Informed Consent: I have reviewed the patients History and Physical, chart, labs and discussed the procedure including the risks, benefits and alternatives for the proposed anesthesia with the patient or authorized representative who has indicated his/her understanding and acceptance.   Dental advisory given  Plan Discussed with: CRNA and Surgeon  Anesthesia Plan Comments:         Anesthesia Quick Evaluation

## 2017-03-23 NOTE — Anesthesia Procedure Notes (Signed)
Date/Time: 03/23/2017 12:36 PM Performed by: Cynda Familia, CRNA Pre-anesthesia Checklist: Patient identified, Emergency Drugs available, Suction available, Patient being monitored and Timeout performed Patient Re-evaluated:Patient Re-evaluated prior to induction Oxygen Delivery Method: Simple face mask Placement Confirmation: positive ETCO2 and breath sounds checked- equal and bilateral Dental Injury: Teeth and Oropharynx as per pre-operative assessment

## 2017-03-23 NOTE — Transfer of Care (Signed)
Immediate Anesthesia Transfer of Care Note  Patient: [name]  Procedure(s) Performed: COLONOSCOPY (N/A )  Patient Location: PACU and Endoscopy Unit  Anesthesia Type:MAC  Level of Consciousness: awake and alert   Airway & Oxygen Therapy: Patient Spontanous Breathing  Post-op Assessment: Report given to RN and Post -op Vital signs reviewed and stable  Post vital signs: Reviewed and stable  Last Vitals:  Vitals:   03/23/17 1137  BP: (!) 114/39  Pulse: 80  Resp: 20  Temp: 36.8 C  SpO2: 100%    Last Pain:  Vitals:   03/23/17 1137  TempSrc: Oral         Complications: No apparent anesthesia complications

## 2017-03-23 NOTE — Discharge Instructions (Signed)

## 2017-03-24 DIAGNOSIS — Z6841 Body Mass Index (BMI) 40.0 and over, adult: Secondary | ICD-10-CM | POA: Diagnosis not present

## 2017-03-24 DIAGNOSIS — L719 Rosacea, unspecified: Secondary | ICD-10-CM | POA: Diagnosis not present

## 2017-03-25 ENCOUNTER — Encounter (HOSPITAL_COMMUNITY): Payer: Self-pay | Admitting: Gastroenterology

## 2017-04-21 MED FILL — SPIRONOLACTONE 25 MG TABLET: 25 | 30 days supply | Qty: 60 | Fill #1

## 2017-04-21 MED FILL — TAMOXIFEN CITRATE 20 MG TAB: 20 | 90 days supply | Qty: 90 | Fill #1

## 2017-07-13 MED FILL — DOXYCYCLINE HYCLATE 100 MG: 100 | 14 days supply | Qty: 28 | Fill #0

## 2017-07-13 MED FILL — IBUPROFEN 600 MG TABLET: 600 | 7 days supply | Qty: 30 | Fill #0

## 2017-07-13 MED FILL — PENICILLIN VK 500 MG TABLET: 500 | 14 days supply | Qty: 56 | Fill #0

## 2017-07-13 MED FILL — HYDROCODON-APAP 5-325: 5-325 | 5 days supply | Qty: 20 | Fill #0

## 2017-07-13 MED FILL — CHLORHEXIDINE 0.12% RINSE: 0.12 | 15 days supply | Qty: 473 | Fill #0

## 2017-08-16 ENCOUNTER — Encounter (HOSPITAL_COMMUNITY): Payer: Self-pay | Admitting: Emergency Medicine

## 2017-08-16 ENCOUNTER — Ambulatory Visit (HOSPITAL_COMMUNITY)
Admission: EM | Admit: 2017-08-16 | Discharge: 2017-08-16 | Disposition: A | Discharge status: Home / Self Care | Payer: BLUE CROSS/BLUE SHIELD

## 2017-08-16 DIAGNOSIS — R220 Localized swelling, mass and lump, head: Secondary | ICD-10-CM | POA: Diagnosis not present

## 2017-08-16 NOTE — ED Provider Notes (Signed)
MRN: [id number] DOB: [birth year]  Subjective:   [name] is a 47 y.o. female presenting for acute onset of right jaw swelling earlier today.  Denies fever, tooth pain, throat pain, sinus pain, ear pain, ear drainage, nausea, vomiting.  Patient recently took a course of antibiotics including penicillin for a left tooth extraction.  This was followed up by doxycycline which she is currently in the middle taking.  She also takes tamoxifen.  She has been seen by her dentist and was advised that she does not have a dental abscess.  She has not tried medications for relief.  No current facility-administered medications for this encounter.   Current Outpatient Medications:  .  doxycycline (VIBRAMYCIN) 100 MG capsule, Take 100 mg by mouth 2 (two) times daily., Disp: , Rfl:  .  Multiple Vitamins-Minerals (MULTI ADULT GUMMIES PO), Take 2 each by mouth daily. , Disp: , Rfl:  .  spironolactone (ALDACTONE) 25 MG tablet, Take 25 mg by mouth 2 (two) times daily. , Disp: , Rfl: 0 .  tamoxifen (NOLVADEX) 20 MG tablet, Take 1 tablet (20 mg total) by mouth daily., Disp: 90 tablet, Rfl: 12 .  emollient (BIAFINE) cream, Apply 1 application topically as needed (for radiation)., Disp: , Rfl:    Allergies  Allergen Reactions  . Amoxicillin Rash  . Chocolate Rash and Other (See Comments)    Only [employer] brand    Past Medical History:  Diagnosis Date  . GERD (gastroesophageal reflux disease)    years ago  . History of kidney stones   . Malignant neoplasm of breast (female), unspecified site   . Poor circulation    in legs  . S/P radiation therapy 03/19/2014 through 05/03/2014    Right breast 4500 cGy in 25 sessions, right breast boost 1600 cGy in 8 sessions   . Snores   . Wears contact lenses      Past Surgical History:  Procedure Laterality Date  . AXILLARY SENTINEL NODE BIOPSY Right 01/25/2014   Procedure: RIGHT SENTINEL LYMPH  NODE MAPPING;  Surgeon: Erroll Luna, MD;  Location: [place];  Service: General;  Laterality: Right;  . BREAST LUMPECTOMY WITH RADIOACTIVE SEED LOCALIZATION Right 01/25/2014   Procedure: RIGHT BREAST LUMPECTOMY WITH RADIOACTIVE SEED LOCALIZATION ;  Surgeon: Erroll Luna, MD;  Location: [place];  Service: General;  Laterality: Right;  . COLONOSCOPY N/A 03/23/2017   Procedure: COLONOSCOPY;  Surgeon: [name], MD;  Location: [facility];  Service: Gastroenterology;  Laterality: N/A;  . PORT-A-CATH REMOVAL N/A 03/07/2015   Procedure: REMOVAL PORT-A-CATH;  Surgeon: Erroll Luna, MD;  Location: [place];  Service: General;  Laterality: N/A;  . PORTACATH PLACEMENT N/A 08/23/2013   Procedure: INSERTION PORT-A-CATH WITH ULTRA SOUND;  Surgeon: Joyice Faster. Cornett, MD;  Location: [place];  Service: General;  Laterality: N/A;  . WISDOM TOOTH EXTRACTION      Objective:   Vitals: BP 124/89   Pulse 81   Temp 98 F (36.7 C) (Oral)   Resp 16   SpO2 100%   Physical Exam  Constitutional: She is oriented to person, place, and time. She appears well-developed and well-nourished.  HENT:  Head:    Right Ear: Tympanic membrane normal.  Left Ear: Tympanic membrane normal.  Nose: No sinus tenderness.  Mouth/Throat: Oropharynx is clear and moist. No dental abscesses. No posterior oropharyngeal edema, posterior oropharyngeal erythema or tonsillar abscesses.  Cardiovascular: Normal rate.  Pulmonary/Chest: Effort normal.  Neurological: She is alert and oriented  to person, place, and time.  Skin: Skin is warm and dry.    Assessment and Plan :   Jaw swelling  Suspect parotitis. We will manage with conservative management.  Patient is to complete her current antibiotic course.  ER and return to clinic precautions reviewed.   Jaynee Eagles, [place] 08/16/17 2108

## 2017-08-16 NOTE — Discharge Instructions (Signed)
You may take 500mg  Tylenol with ibuprofen 400-600mg  every 6 hours for pain and inflammation.  Apply warm compresses to the area for 10 to 15 minutes 3 times a day.  Come back for a recheck if you end up developing fever, difficulty opening your jaw, redness, drainage of pus or bleeding.  Please maintain the antibiotic course that you are currently going through.

## 2017-08-16 NOTE — ED Triage Notes (Signed)
PT has right sided facial swelling that started 4 hours ago. PT had earphones in and first noticed swelling over upper cheek / near ear. Swelling is now along jaw line.

## 2017-08-25 MED FILL — TAMOXIFEN CITRATE 20 MG TAB: 20 | 90 days supply | Qty: 90 | Fill #2

## 2017-08-25 MED FILL — SPIRONOLACTONE 25 MG TABS: 25 | 30 days supply | Qty: 60 | Fill #2

## 2017-09-08 ENCOUNTER — Telehealth: Payer: Self-pay | Admitting: Oncology

## 2017-09-08 ENCOUNTER — Other Ambulatory Visit: Payer: Self-pay

## 2017-09-08 DIAGNOSIS — C50411 Malignant neoplasm of upper-outer quadrant of right female breast: Secondary | ICD-10-CM

## 2017-09-08 DIAGNOSIS — Z170 Estrogen receptor positive status [ER+]: Secondary | ICD-10-CM

## 2017-09-08 NOTE — Progress Notes (Signed)
Pt requesting to have MM orders placed so she can schedule with breast center. Orders placed.

## 2017-09-08 NOTE — Telephone Encounter (Signed)
Patient called to reschedule  °

## 2017-09-09 ENCOUNTER — Inpatient Hospital Stay: Payer: BLUE CROSS/BLUE SHIELD

## 2017-09-16 ENCOUNTER — Ambulatory Visit: Payer: BLUE CROSS/BLUE SHIELD | Admitting: Oncology

## 2017-10-06 ENCOUNTER — Other Ambulatory Visit: Payer: Self-pay

## 2017-10-06 DIAGNOSIS — C50411 Malignant neoplasm of upper-outer quadrant of right female breast: Secondary | ICD-10-CM

## 2017-10-06 DIAGNOSIS — Z170 Estrogen receptor positive status [ER+]: Secondary | ICD-10-CM

## 2017-10-07 ENCOUNTER — Inpatient Hospital Stay: Payer: BLUE CROSS/BLUE SHIELD | Attending: Oncology

## 2017-10-07 DIAGNOSIS — Z170 Estrogen receptor positive status [ER+]: Secondary | ICD-10-CM | POA: Diagnosis not present

## 2017-10-07 DIAGNOSIS — C50411 Malignant neoplasm of upper-outer quadrant of right female breast: Secondary | ICD-10-CM | POA: Diagnosis not present

## 2017-10-07 LAB — CMP (CANCER CENTER ONLY)
ALK PHOS: 75 U/L (ref 38–126)
ALT: 18 U/L (ref 0–44)
AST: 16 U/L (ref 15–41)
Albumin: 3.4 g/dL — ABNORMAL LOW (ref 3.5–5.0)
Anion gap: 8 (ref 5–15)
BUN: 9 mg/dL (ref 6–20)
CALCIUM: 8.9 mg/dL (ref 8.9–10.3)
CHLORIDE: 106 mmol/L (ref 98–111)
CO2: 28 mmol/L (ref 22–32)
CREATININE: 0.8 mg/dL (ref 0.44–1.00)
GFR, Est AFR Am: >60 mL/min (ref >60)
GFR, Estimated: >60 mL/min (ref >60)
Glucose, Bld: 119 mg/dL — ABNORMAL HIGH (ref 70–99)
Potassium: 3.9 mmol/L (ref 3.5–5.1)
Sodium: 142 mmol/L (ref 135–145)
Total Bilirubin: <0.2 mg/dL — ABNORMAL LOW (ref 0.3–1.2)
Total Protein: 7.3 g/dL (ref 6.5–8.1)

## 2017-10-07 LAB — CBC WITH DIFFERENTIAL (CANCER CENTER ONLY)
Basophils Absolute: 0.1 10*3/uL (ref 0.0–0.1)
Basophils Relative: 1 %
EOS ABS: 0.1 10*3/uL (ref 0.0–0.5)
EOS PCT: 1 %
HCT: 38.3 % (ref 34.8–46.6)
Hemoglobin: 12.2 g/dL (ref 11.6–15.9)
LYMPHS ABS: 3.7 10*3/uL — AB (ref 0.9–3.3)
Lymphocytes Relative: 45 %
MCH: 26.9 pg (ref 25.1–34.0)
MCHC: 32 g/dL (ref 31.5–36.0)
MCV: 84.2 fL (ref 79.5–101.0)
MONOS PCT: 6 %
Monocytes Absolute: 0.5 10*3/uL (ref 0.1–0.9)
Neutro Abs: 3.8 10*3/uL (ref 1.5–6.5)
Neutrophils Relative %: 47 %
PLATELETS: 276 10*3/uL (ref 145–400)
RBC: 4.55 MIL/uL (ref 3.70–5.45)
RDW: 15.9 % — ABNORMAL HIGH (ref 11.2–14.5)
WBC: 8.2 10*3/uL (ref 3.9–10.3)

## 2017-10-13 ENCOUNTER — Ambulatory Visit
Admission: RE | Admit: 2017-10-13 | Discharge: 2017-10-13 | Disposition: A | Discharge status: Home / Self Care | Payer: BLUE CROSS/BLUE SHIELD | Source: Ambulatory Visit | Attending: Oncology | Admitting: Oncology

## 2017-10-13 DIAGNOSIS — R928 Other abnormal and inconclusive findings on diagnostic imaging of breast: Secondary | ICD-10-CM | POA: Diagnosis not present

## 2017-10-13 DIAGNOSIS — Z170 Estrogen receptor positive status [ER+]: Secondary | ICD-10-CM

## 2017-10-13 DIAGNOSIS — Z853 Personal history of malignant neoplasm of breast: Secondary | ICD-10-CM | POA: Diagnosis not present

## 2017-10-13 DIAGNOSIS — C50411 Malignant neoplasm of upper-outer quadrant of right female breast: Secondary | ICD-10-CM

## 2017-10-13 HISTORY — DX: Personal history of irradiation: Z92.3

## 2017-10-13 HISTORY — DX: Personal history of antineoplastic chemotherapy: Z92.21

## 2017-10-13 HISTORY — DX: Malignant neoplasm of unspecified site of unspecified female breast: C50.919

## 2017-10-13 NOTE — Progress Notes (Signed)
[place]  Telephone:([phone] Fax:(336) [phone]     ID: [name] DOB: 1970/03/25  MR#: [id number]  MGQ#:[id number]  Patient Care Team: Deland Pretty, MD as PCP - General (Internal Medicine) Erroll Luna, MD as Consulting Physician (General Surgery) Daelyn Pettaway, Virgie Dad, MD as Consulting Physician (Oncology) Everlene Farrier, MD as Consulting Physician (Obstetrics and Gynecology)  CHIEF COMPLAINT:  triple positive breast cancer  CURRENT TREATMENT:  Tamoxifen  BREAST CANCER HISTORY: From the original intake note:  The patient herself palpated a mass in her right breast mid July. She brought it to Dr. Sherlynn Stalls attention and he set the patient up for bilateral diagnostic mammography and right breast ultrasonography of the breast Center 07/28/2013. Right mammogram showed an irregular mass in the upper outer quadrant measuring 1.4 cm. There was some associated pleomorphic microcalcifications. This was palpable, at the 10:30 position 11 cm from the nipple. By palpation it measured approximately 1.5 cm. Ultrasound confirmed an hypoechoic mass with irregular borders measuring 1.1 cm. The right axilla showed a few adjacent deep lymph nodes with mild cortical thickening.  On 08/08/2013 the patient underwent biopsy of the breast mass in question. The pathology ([id number]) showed an invasive ductal carcinoma, grade 2, estrogen receptor 49% positive, progesterone receptor 47% positive, both with moderate staining intensity, with an MIB-1 of 52%, and HER-2 amplification with a signals ratio of 8.1. The right axillary lymph node biopsied at the same time was benign this was felt to be possibly concordant.  And 08/15/2013 the patient underwent bilateral breast MRI showing a 2 cm irregular enhancing mass in the upper outer quadrant of the right breast. There were no additional changes no abnormal appearing lymph nodes and no masses or abnormalities in the left breast.  The  patient's subsequent history is as detailed below  INTERVAL HISTORY: [name] returns today for follow-up of her estrogen receptor positive breast cancer. She continues on tamoxifen, with good tolerance. She has nighttime hot flashes that wake her up. She takes gabapentin 200 mg which helps with the hot flashes but makes her groggy in the morning. She will try 100 mg at night. She is also willing to try venlafaxine. She is no longer having issues with vaginal dryness. She completed the pelvic health program.  Since her last visit, she underwent diagnostic bilateral mammography with CAD and tomography on 10/13/2017 at [facility] showing: breast density category A. There was no evidence of malignancy.    REVIEW OF SYSTEMS: Georgeann climbs the stairs in her house, but not as a voluntary mode of exercise. She denies unusual headaches, visual changes, nausea, vomiting, or dizziness. There has been no unusual cough, phlegm production, or pleurisy. There has been no change in bowel or bladder habits. She denies unexplained fatigue or unexplained weight loss, bleeding, rash, or fever. A detailed review of systems was otherwise stable.    PAST MEDICAL HISTORY: Past Medical History:  Diagnosis Date  . Breast cancer ([place])   . GERD (gastroesophageal reflux disease)    years ago  . History of kidney stones   . Malignant neoplasm of breast (female), unspecified site   . Personal history of chemotherapy   . Personal history of radiation therapy   . Poor circulation    in legs  . S/P radiation therapy 03/19/2014 through 05/03/2014    Right breast 4500 cGy in 25 sessions, right breast boost 1600 cGy in 8 sessions   . Snores   . Wears contact lenses  PAST SURGICAL HISTORY: Past Surgical History:  Procedure Laterality Date  . AXILLARY SENTINEL NODE BIOPSY Right 01/25/2014   Procedure: RIGHT SENTINEL LYMPH NODE MAPPING;  Surgeon:  Erroll Luna, MD;  Location: [facility];  Service: General;  Laterality: Right;  . BREAST BIOPSY    . BREAST LUMPECTOMY Right    2016  . BREAST LUMPECTOMY WITH RADIOACTIVE SEED LOCALIZATION Right 01/25/2014   Procedure: RIGHT BREAST LUMPECTOMY WITH RADIOACTIVE SEED LOCALIZATION ;  Surgeon: Erroll Luna, MD;  Location: [facility];  Service: General;  Laterality: Right;  . COLONOSCOPY N/A 03/23/2017   Procedure: COLONOSCOPY;  Surgeon: [name], MD;  Location: [facility];  Service: Gastroenterology;  Laterality: N/A;  . PORT-A-CATH REMOVAL N/A 03/07/2015   Procedure: REMOVAL PORT-A-CATH;  Surgeon: Erroll Luna, MD;  Location: [facility];  Service: General;  Laterality: N/A;  . PORTACATH PLACEMENT N/A 08/23/2013   Procedure: INSERTION PORT-A-CATH WITH ULTRA SOUND;  Surgeon: Joyice Faster. Cornett, MD;  Location: [facility];  Service: General;  Laterality: N/A;  . WISDOM TOOTH EXTRACTION      FAMILY HISTORY Family History  Problem Relation Age of Onset  . Uterine cancer Mother 9       TAH/BSO  . Colon cancer Father 57       deceased 73  . Leukemia Maternal Grandmother        deceased 26  . Skin cancer Paternal Grandmother   . Prostate cancer Other    the patient's father died from colon cancer the age of 48. It had been diagnosed 3 years prior. The patient's mother was diagnosed with cancer of the uterus at age 18. She is 48 years old as of August 2015. The patient had no brothers or sisters. There is a history of prostate cancer leukemia at in the family as well as melanoma, but there is no history of breast or ovarian cancer in the family.  GYNECOLOGIC HISTORY:  No LMP recorded. (Menstrual status: Chemotherapy). Menarche age 66. The patient is GX P0. She used oral contraceptives for several years remotely with no complications. She stopped having periods after first cycle of chemotherapy.  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 She is single. She lives alone, with no pets.    ADVANCED DIRECTIVES: Not in place. At her 08/16/2013 visit the patient was given the upper. Documents 2 complete and notarize so she may name her mother, [name] focal as her healthcare power of attorney, which is the patient's intention. Ms. focal can be reached at [phone]. She currently lives in [place] but is planning to move to [place]: Social History   Tobacco Use  . Smoking status: Former Smoker    Last attempt to quit: 02/20/2013    Years since quitting: 4.6  . Smokeless tobacco: Never Used  Substance Use Topics  . Alcohol use: Yes    Comment: 1/week  . Drug use: No     Colonoscopy: 03/23/2017/ Dr. Therisa Doyne / normal  PAP: July 2015   Bone density: Never  Lipid panel:  Allergies  Allergen Reactions  . Amoxicillin Rash  . Chocolate Rash and Other (See Comments)    Only Hershey[employer] brand    Current Outpatient Medications  Medication Sig Dispense Refill  . emollient (BIAFINE) cream Apply 1 application topically as needed (for radiation).    . Multiple Vitamins-Minerals (MULTI ADULT GUMMIES PO) Take 2 each by mouth daily.     Marland Kitchen  spironolactone (ALDACTONE) 25 MG tablet Take 25 mg by mouth 2 (two) times daily.   0  . tamoxifen (NOLVADEX) 20 MG tablet Take 1 tablet (20 mg total) by mouth daily. 90 tablet 12  . gabapentin (NEURONTIN) 100 MG capsule Take 3 capsules (300 mg total) by mouth at bedtime. 90 capsule 4  . venlafaxine XR (EFFEXOR-XR) 37.5 MG 24 hr capsule Take 1 capsule (37.5 mg total) by mouth daily with breakfast. 90 capsule 4   No current facility-administered medications for this visit.     OBJECTIVE: Morbidly obese African American woman in no acute distress Vitals:   10/14/17 1235  BP: 113/79  Pulse: 89  Resp: 14  Temp: 99.2 F (37.3 C)     Body mass index is 53.42 kg/m.     ECOG FS:0 - Asymptomatic   Sclerae unicteric, EOMs intact Oropharynx clear and  moist No cervical or supraclavicular adenopathy Lungs no rales or rhonchi Heart regular rate and rhythm Abd soft, nontender, positive bowel sounds MSK no focal spinal tenderness, no upper extremity lymphedema Neuro: nonfocal, well oriented, appropriate affect Breasts: Right breast is status post lumpectomy and radiation.  There is no evidence of local recurrence.  There are the expected postradiation changes.  Left breast is benign.  Both axillae are benign.   LAB RESULTS:  CMP     Component Value Date/Time   NA 142 10/07/2017 1504   NA 141 08/25/2016 0829   K 3.9 10/07/2017 1504   K 3.9 08/25/2016 0829   CL 106 10/07/2017 1504   CO2 28 10/07/2017 1504   CO2 24 08/25/2016 0829   GLUCOSE 119 (H) 10/07/2017 1504   GLUCOSE 108 08/25/2016 0829   BUN 9 10/07/2017 1504   BUN 11.2 08/25/2016 0829   CREATININE 0.80 10/07/2017 1504   CREATININE 0.9 08/25/2016 0829   CALCIUM 8.9 10/07/2017 1504   CALCIUM 9.3 08/25/2016 0829   PROT 7.3 10/07/2017 1504   PROT 6.9 08/25/2016 0829   ALBUMIN 3.4 (L) 10/07/2017 1504   ALBUMIN 3.2 (L) 08/25/2016 0829   AST 16 10/07/2017 1504   AST 24 08/25/2016 0829   ALT 18 10/07/2017 1504   ALT 24 08/25/2016 0829   ALKPHOS 75 10/07/2017 1504   ALKPHOS 78 08/25/2016 0829   BILITOT <0.2 (L) 10/07/2017 1504   BILITOT <0.22 08/25/2016 0829   GFRNONAA >60 10/07/2017 1504   GFRAA >60 10/07/2017 1504    I No results found for: SPEP  Lab Results  Component Value Date   WBC 8.2 10/07/2017   NEUTROABS 3.8 10/07/2017   HGB 12.2 10/07/2017   HCT 38.3 10/07/2017   MCV 84.2 10/07/2017   PLT 276 10/07/2017      Chemistry      Component Value Date/Time   NA 142 10/07/2017 1504   NA 141 08/25/2016 0829   K 3.9 10/07/2017 1504   K 3.9 08/25/2016 0829   CL 106 10/07/2017 1504   CO2 28 10/07/2017 1504   CO2 24 08/25/2016 0829   BUN 9 10/07/2017 1504   BUN 11.2 08/25/2016 0829   CREATININE 0.80 10/07/2017 1504   CREATININE 0.9 08/25/2016 0829       Component Value Date/Time   CALCIUM 8.9 10/07/2017 1504   CALCIUM 9.3 08/25/2016 0829   ALKPHOS 75 10/07/2017 1504   ALKPHOS 78 08/25/2016 0829   AST 16 10/07/2017 1504   AST 24 08/25/2016 0829   ALT 18 10/07/2017 1504   ALT 24 08/25/2016 0829   BILITOT <0.2 (  L) 10/07/2017 1504   BILITOT <0.22 08/25/2016 0829       No results found for: LABCA2  No components found for: [id number]  No results for input(s): INR in the last 168 hours.  Urinalysis    Component Value Date/Time   COLORURINE ORANGE (A) 06/22/2014 0019    STUDIES: Mm Diag Breast Tomo Bilateral  Result Date: 10/13/2017 CLINICAL DATA:  47 year old female status post malignant right lumpectomy with chemotherapy and radiation in 2016. No current symptoms. EXAM: DIGITAL DIAGNOSTIC BILATERAL MAMMOGRAM WITH CAD AND TOMO COMPARISON:  Previous exam(s). ACR Breast Density Category a: The breast tissue is almost entirely fatty. FINDINGS: Stable post lumpectomy and post radiation changes are noted on the right. No new or suspicious findings are identified in either breast. The parenchymal pattern is stable. Mammographic images were processed with CAD. IMPRESSION: 1. No mammographic evidence of malignancy in either breast. 2. Stable right breast posttreatment changes. RECOMMENDATION: Diagnostic mammogram is suggested in 1 year. (Code:[id number]-01Y) I have discussed the findings and recommendations with the patient. Results were also provided in writing at the conclusion of the visit. If applicable, a reminder letter will be sent to the patient regarding the next appointment. BI-RADS CATEGORY  2: Benign. Electronically Signed   By: Kristopher Oppenheim M.D.   On: 10/13/2017 16:07      ASSESSMENT: 47 y.o. BRCA negative [place] woman status post right breast upper outer quadrant biopsy 08/08/2013 for a triple positive invasive ductal carcinoma, grade 2, with an MIB-1 of 52%, and a suspicious lymph node biopsied at the same time pathologically  benign.  (1) genetics testing found no deleterious mutations in ATM, BARD1, BRCA1, BRCA2, BRIP1, CDH1, CHEK2, MRE11A, MUTYH, NBN, NF1, PALB2, PTEN, RAD50, RAD51C, RAD51D, and TP53  (2) neoadjuvant chemotherapy consisted of carboplatin, docetaxel, trastuzumab and pertuzumab for 6 cycles, completed 12/21/2013  (a) pertuzumab discontinued after cycle 1 because of severe diarrhea  (b) carboplatin switched for cyclophosphamide because of hypersensitivity reaction on 11/20/13  (c) docetaxel removed from final cycle because of peripheral neuropathy symptoms.  (3) trastuzumab continued to complete one year (through August 2016)  (a) repeat echocardiogram 06/25/2014 shows a well preserved ejection fraction  (4) status post right lumpectomy and sentinel lymph node sampling 01/25/2014 showing a residual pT1b pN0 invasive ductal carcinoma, grade 2, again HER-2 amplified  (5) adjuvant radiation completed 05/03/2014  (6) tamoxifen started May 2016   PLAN: Kinzi is now nearly 4 years out from definitive surgery for breast cancer with no evidence of disease recurrence.  This is very favorable.  She is 3-1/2 years into 5 years of tamoxifen.  She is generally tolerating that well.  The plan is to continue a minimum of 5 years.  She would like to know if she is postmenopausal.  The fact that she is not having periods does not answer that question since tamoxifen can "hot" periods.  We will obtain an [facility] and estradiol before the next visit.  She understands that tamoxifen is not a contraceptive.  She is using contraception at this point  I have added venlafaxine low-dose to her medications hoping this will help the hot flashes and also the slight irritability that she is experiencing.  We may need to double the dose.  She will let us know if this very low dose does not work for her.  She knows to call for any other issues that may develop before her next visit.  Shanyia Stines, Virgie Dad, MD  10/14/17 12:54  PM Medical Oncology and  Hematology [facility] [place], [place] [address] Tel. [phone]    Fax. [phone]  Alice Rieger, am acting as scribe for Chauncey Cruel MD.  I, Lurline Del MD, have reviewed the above documentation for accuracy and completeness, and I agree with the above.

## 2017-10-14 ENCOUNTER — Telehealth: Payer: Self-pay | Admitting: Oncology

## 2017-10-14 ENCOUNTER — Inpatient Hospital Stay: Payer: BLUE CROSS/BLUE SHIELD | Attending: Oncology | Admitting: Oncology

## 2017-10-14 VITALS — BP 113/79 | HR 89 | Temp 99.2°F | Resp 14 | Wt 321.0 lb

## 2017-10-14 DIAGNOSIS — C50411 Malignant neoplasm of upper-outer quadrant of right female breast: Secondary | ICD-10-CM | POA: Insufficient documentation

## 2017-10-14 DIAGNOSIS — Z170 Estrogen receptor positive status [ER+]: Secondary | ICD-10-CM | POA: Insufficient documentation

## 2017-10-14 DIAGNOSIS — Z87891 Personal history of nicotine dependence: Secondary | ICD-10-CM | POA: Insufficient documentation

## 2017-10-14 MED ORDER — VENLAFAXINE HCL ER 37.5 MG PO CP24: 37.5000 mg | ORAL_CAPSULE | Freq: Every day | ORAL | 4 refills | Status: DC

## 2017-10-14 MED ORDER — GABAPENTIN 100 MG PO CAPS: 300.0000 mg | ORAL_CAPSULE | Freq: Every day | ORAL | 4 refills | Status: DC

## 2017-10-14 MED FILL — VENLAFAXINE HCL ER 37.5 MG: 37.5 | 90 days supply | Qty: 90 | Fill #0

## 2017-10-14 MED FILL — SPIRONOLACTONE 25 MG TABS: 25 | 30 days supply | Qty: 60 | Fill #3

## 2017-10-14 MED FILL — GABAPENTIN 100 MG CAPSULE: 100 | 30 days supply | Qty: 90 | Fill #0

## 2017-10-14 NOTE — Telephone Encounter (Signed)
Gave patient avs and calendar.   °

## 2017-12-17 MED FILL — TAMOXIFEN CITRATE 20 MG TAB: 20 | 90 days supply | Qty: 90 | Fill #3

## 2018-02-18 MED FILL — VENLAFAXINE HCL ER 37.5 MG: 37.5 | 90 days supply | Qty: 90 | Fill #1

## 2018-02-18 MED FILL — SPIRONOLACTONE 25 MG TABS: 25 | 30 days supply | Qty: 60 | Fill #0

## 2018-04-01 ENCOUNTER — Other Ambulatory Visit: Payer: Self-pay | Admitting: Oncology

## 2018-04-01 DIAGNOSIS — C50411 Malignant neoplasm of upper-outer quadrant of right female breast: Secondary | ICD-10-CM

## 2018-04-01 MED FILL — TAMOXIFEN CITRATE 20 MG TAB: 20 | 90 days supply | Qty: 90 | Fill #0

## 2018-04-01 NOTE — Telephone Encounter (Signed)
Started 06-04-2014.

## 2018-04-25 ENCOUNTER — Telehealth: Payer: BLUE CROSS/BLUE SHIELD | Admitting: Physician Assistant

## 2018-04-25 DIAGNOSIS — R52 Pain, unspecified: Secondary | ICD-10-CM

## 2018-04-25 NOTE — Progress Notes (Signed)
E-Visit for Corona Virus Screening  Based on your current symptoms, you may very well have the virus, however your symptoms are mild. Currently, not all patients are being tested. If the symptoms are mild and there is not a known exposure, performing the test is not indicated.  Coronavirus disease 2019 (COVID-19) is a respiratory illness that can spread from person to person. The virus that causes COVID-19 is a new virus that was first identified in the country of [place] but is now found in multiple other countries and has spread to the [place].  Symptoms associated with the virus are mild to severe fever, cough, and shortness of breath. There is currently no vaccine to protect against COVID-19, and there is no specific antiviral treatment for the virus.   To be considered HIGH RISK for Coronavirus (COVID-19), you have to meet the following criteria:  . Traveled to [place], [place], [place], [place] or [place]; or in the [place] to [place], [place], [place], or [place]; and have fever, cough, and shortness of breath within the last 2 weeks of travel OR  . Been in close contact with a person diagnosed with COVID-19 within the last 2 weeks and have fever, cough, and shortness of breath  . IF YOU DO NOT MEET THESE CRITERIA, YOU ARE CONSIDERED LOW RISK FOR COVID-19.   It is vitally important that if you feel that you have an infection such as this virus or any other virus that you stay home and away from places where you may spread it to others.  You should self-quarantine for 14 days if you have symptoms that could potentially be coronavirus and avoid contact with people age 22 and older.   You may also take acetaminophen (Tylenol) as needed for fever.  Stay well hydrated. Drink a few liters of water and/or clear fluids daily.    Reduce your risk of any infection by using the same precautions used for avoiding the common cold or flu:  Marland Kitchen Wash your hands often with soap and warm water  for at least 20 seconds.  If soap and water are not readily available, use an alcohol-based hand sanitizer with at least 60% alcohol.  . If coughing or sneezing, cover your mouth and nose by coughing or sneezing into the elbow areas of your shirt or coat, into a tissue or into your sleeve (not your hands). . Avoid shaking hands with others and consider head nods or verbal greetings only. . Avoid touching your eyes, nose, or mouth with unwashed hands.  . Avoid close contact with people who are sick. . Avoid places or events with large numbers of people in one location, like concerts or sporting events. . Carefully consider travel plans you have or are making. . If you are planning any travel outside or inside the [place], visit the CDC's Travelers' Health webpage for the latest health notices. . If you have some symptoms but not all symptoms, continue to monitor at home and seek medical attention if your symptoms worsen. . If you are having a medical emergency, call 911.  HOME CARE . Only take medications as instructed by your medical team. . Drink plenty of fluids and get plenty of rest. . A steam or ultrasonic humidifier can help if you have congestion.   GET HELP RIGHT AWAY IF: . You develop worsening fever. . You become short of breath . You cough up blood. . Your symptoms become more severe MAKE SURE YOU  Understand these instructions.  Will watch your condition.  Will get help right away if you are not doing well or get worse.  Your e-visit answers were reviewed by a board certified advanced clinical practitioner to complete your personal care plan.  Depending on the condition, your plan could have included both over the counter or prescription medications.  If there is a problem please reply once you have received a response from your provider. Your safety is important to [place].  If you have drug allergies check your prescription carefully.    You can use MyChart to ask questions about  today's visit, request a non-urgent call back, or ask for a work or school excuse for 24 hours related to this e-Visit. If it has been greater than 24 hours you will need to follow up with your provider, or enter a new e-Visit to address those concerns. You will get an e-mail in the next two days asking about your experience.  I hope that your e-visit has been valuable and will speed your recovery. Thank you for using e-visits.

## 2018-05-03 DIAGNOSIS — R109 Unspecified abdominal pain: Secondary | ICD-10-CM | POA: Diagnosis not present

## 2018-07-27 MED FILL — VENLAFAXINE HCL ER 37.5 MG: 37.5 | 90 days supply | Qty: 90 | Fill #2

## 2018-07-27 MED FILL — SPIRONOLACTONE 25 MG TABS: 25 | 30 days supply | Qty: 60 | Fill #1

## 2018-07-27 MED FILL — TAMOXIFEN CITRATE 20 MG TAB: 20 | 90 days supply | Qty: 90 | Fill #1

## 2018-09-08 ENCOUNTER — Other Ambulatory Visit: Payer: Self-pay | Admitting: *Deleted

## 2018-09-08 ENCOUNTER — Telehealth: Payer: Self-pay | Admitting: *Deleted

## 2018-09-08 DIAGNOSIS — Z1231 Encounter for screening mammogram for malignant neoplasm of breast: Secondary | ICD-10-CM

## 2018-09-08 NOTE — Telephone Encounter (Signed)
This RN received VM from pt stating she needs an order for her MM due before her next appointment " so I won't be fussed at ".  This RN entered orders and attempted to contact pt. Home number did not have any identifcation for verifying and mobile did not a VM set up.

## 2018-10-06 ENCOUNTER — Other Ambulatory Visit: Payer: Self-pay

## 2018-10-06 ENCOUNTER — Inpatient Hospital Stay: Payer: BC Managed Care – PPO | Attending: Oncology

## 2018-10-06 DIAGNOSIS — C50411 Malignant neoplasm of upper-outer quadrant of right female breast: Secondary | ICD-10-CM | POA: Diagnosis not present

## 2018-10-06 DIAGNOSIS — Z170 Estrogen receptor positive status [ER+]: Secondary | ICD-10-CM | POA: Diagnosis not present

## 2018-10-06 LAB — CMP (CANCER CENTER ONLY)
ALT: 19 U/L (ref 0–44)
AST: 17 U/L (ref 15–41)
Albumin: 3.8 g/dL (ref 3.5–5.0)
Alkaline Phosphatase: 79 U/L (ref 38–126)
Anion gap: 10 (ref 5–15)
BUN: 13 mg/dL (ref 6–20)
CO2: 24 mmol/L (ref 22–32)
Calcium: 9 mg/dL (ref 8.9–10.3)
Chloride: 107 mmol/L (ref 98–111)
Creatinine: 0.86 mg/dL (ref 0.44–1.00)
GFR, Est AFR Am: >60 mL/min (ref >60)
GFR, Estimated: >60 mL/min (ref >60)
Glucose, Bld: 100 mg/dL — ABNORMAL HIGH (ref 70–99)
Potassium: 4.2 mmol/L (ref 3.5–5.1)
Sodium: 141 mmol/L (ref 135–145)
Total Bilirubin: <0.2 mg/dL — ABNORMAL LOW (ref 0.3–1.2)
Total Protein: 7.3 g/dL (ref 6.5–8.1)

## 2018-10-06 LAB — CBC WITH DIFFERENTIAL (CANCER CENTER ONLY)
Abs Immature Granulocytes: 0.03 10*3/uL (ref 0.00–0.07)
Basophils Absolute: 0.1 10*3/uL (ref 0.0–0.1)
Basophils Relative: 1 %
Eosinophils Absolute: 0.1 10*3/uL (ref 0.0–0.5)
Eosinophils Relative: 1 %
HCT: 39.4 % (ref 36.0–46.0)
Hemoglobin: 12.5 g/dL (ref 12.0–15.0)
Immature Granulocytes: 0 %
Lymphocytes Relative: 45 %
Lymphs Abs: 3.8 10*3/uL (ref 0.7–4.0)
MCH: 27.2 pg (ref 26.0–34.0)
MCHC: 31.7 g/dL (ref 30.0–36.0)
MCV: 85.8 fL (ref 80.0–100.0)
Monocytes Absolute: 0.5 10*3/uL (ref 0.1–1.0)
Monocytes Relative: 6 %
Neutro Abs: 4.1 10*3/uL (ref 1.7–7.7)
Neutrophils Relative %: 47 %
Platelet Count: 233 10*3/uL (ref 150–400)
RBC: 4.59 MIL/uL (ref 3.87–5.11)
RDW: 15.8 % — ABNORMAL HIGH (ref 11.5–15.5)
WBC Count: 8.5 10*3/uL (ref 4.0–10.5)
nRBC: 0 % (ref 0.0–0.2)

## 2018-10-07 LAB — FOLLICLE STIMULATING HORMONE: FSH: 10.8 m[IU]/mL

## 2018-10-11 LAB — ESTRADIOL, ULTRA SENS: Estradiol, Sensitive: 25.9 pg/mL

## 2018-10-13 ENCOUNTER — Other Ambulatory Visit: Payer: Self-pay | Admitting: Oncology

## 2018-10-13 DIAGNOSIS — Z853 Personal history of malignant neoplasm of breast: Secondary | ICD-10-CM

## 2018-10-17 ENCOUNTER — Other Ambulatory Visit: Payer: Self-pay

## 2018-10-17 ENCOUNTER — Ambulatory Visit
Admission: RE | Admit: 2018-10-17 | Discharge: 2018-10-17 | Disposition: A | Discharge status: Home / Self Care | Payer: BC Managed Care – PPO | Source: Ambulatory Visit | Attending: Oncology | Admitting: Oncology

## 2018-10-17 ENCOUNTER — Other Ambulatory Visit: Payer: Self-pay | Admitting: Adult Health

## 2018-10-17 DIAGNOSIS — Z853 Personal history of malignant neoplasm of breast: Secondary | ICD-10-CM

## 2018-10-17 DIAGNOSIS — R928 Other abnormal and inconclusive findings on diagnostic imaging of breast: Secondary | ICD-10-CM | POA: Diagnosis not present

## 2018-10-19 NOTE — Progress Notes (Signed)
[place]  [phone] [phone]     ID: NAKIMA FLUEGGE DOB: 04-17-1970  MR#: [id number]  IDP#:[id number]  Patient Care Team: Deland Pretty, MD as PCP - General (Internal Medicine) Erroll Luna, MD as Consulting Physician (General Surgery) Marise Knapper, Virgie Dad, MD as Consulting Physician (Oncology) Everlene Farrier, MD as Consulting Physician (Obstetrics and Gynecology)  CHIEF COMPLAINT:  triple positive breast cancer  CURRENT TREATMENT:  Tamoxifen   INTERVAL HISTORY: Bee returns today for follow-up of her estrogen receptor positive breast cancer.   She continues on tamoxifen.  The severe hot flashes she had previously have pretty much gone.  This may be because of the low dose of venlafaxine she is taking her simply because "her body is getting used to it".  Vaginal discharge is not an issue for her.  She has not had any periods since her chemo and she wanted to know if she was postmenopausal.  As discussed below she is still premenopausal.  Since her last visit, she underwent bilateral diagnostic mammography with tomography at [facility] on 10/17/2018 showing: breast density category A; no evidence of malignancy in either breast.   REVIEW OF SYSTEMS: Mardell continues to work from home which is not a new problem for her.  However she no longer has some of the outlets that she had before, because of the pandemic, and while not depressed she is a little bit frustrated.  She is not exercising regularly.  A detailed review of systems was otherwise stable   BREAST CANCER HISTORY: From the original intake note:  The patient herself palpated a mass in her right breast mid July. She brought it to Dr. Sherlynn Stalls attention and he set the patient up for bilateral diagnostic mammography and right breast ultrasonography of the breast Center 07/28/2013. Right mammogram showed an irregular mass in the upper outer quadrant measuring 1.4 cm. There was  some associated pleomorphic microcalcifications. This was palpable, at the 10:30 position 11 cm from the nipple. By palpation it measured approximately 1.5 cm. Ultrasound confirmed an hypoechoic mass with irregular borders measuring 1.1 cm. The right axilla showed a few adjacent deep lymph nodes with mild cortical thickening.  On 08/08/2013 the patient underwent biopsy of the breast mass in question. The pathology (SAA [id number]) showed an invasive ductal carcinoma, grade 2, estrogen receptor 49% positive, progesterone receptor 47% positive, both with moderate staining intensity, with an MIB-1 of 52%, and HER-2 amplification with a signals ratio of 8.1. The right axillary lymph node biopsied at the same time was benign this was felt to be possibly concordant.  And 08/15/2013 the patient underwent bilateral breast MRI showing a 2 cm irregular enhancing mass in the upper outer quadrant of the right breast. There were no additional changes no abnormal appearing lymph nodes and no masses or abnormalities in the left breast.  The patient's subsequent history is as detailed below   PAST MEDICAL HISTORY: Past Medical History:  Diagnosis Date  . Breast cancer ([place])   . GERD (gastroesophageal reflux disease)    years ago  . History of kidney stones   . Malignant neoplasm of breast (female), unspecified site   . Personal history of chemotherapy   . Personal history of radiation therapy   . Poor circulation    in legs  . S/P radiation therapy 03/19/2014 through 05/03/2014    Right breast 4500 cGy in 25 sessions, right breast boost 1600 cGy in 8 sessions   . Snores   .  Wears contact lenses     PAST SURGICAL HISTORY: Past Surgical History:  Procedure Laterality Date  . AXILLARY SENTINEL NODE BIOPSY Right 01/25/2014   Procedure: RIGHT SENTINEL LYMPH NODE MAPPING;  Surgeon: Erroll Luna, MD;  Location: [facility];   Service: General;  Laterality: Right;  . BREAST BIOPSY    . BREAST LUMPECTOMY Right    2016  . BREAST LUMPECTOMY WITH RADIOACTIVE SEED LOCALIZATION Right 01/25/2014   Procedure: RIGHT BREAST LUMPECTOMY WITH RADIOACTIVE SEED LOCALIZATION ;  Surgeon: Erroll Luna, MD;  Location: [facility];  Service: General;  Laterality: Right;  . COLONOSCOPY N/A 03/23/2017   Procedure: COLONOSCOPY;  Surgeon: [name], MD;  Location: [facility];  Service: Gastroenterology;  Laterality: N/A;  . PORT-A-CATH REMOVAL N/A 03/07/2015   Procedure: REMOVAL PORT-A-CATH;  Surgeon: Erroll Luna, MD;  Location: [facility];  Service: General;  Laterality: N/A;  . PORTACATH PLACEMENT N/A 08/23/2013   Procedure: INSERTION PORT-A-CATH WITH ULTRA SOUND;  Surgeon: Joyice Faster. Cornett, MD;  Location: [facility];  Service: General;  Laterality: N/A;  . WISDOM TOOTH EXTRACTION      FAMILY HISTORY Family History  Problem Relation Age of Onset  . Uterine cancer Mother 1       TAH/BSO  . Colon cancer Father 79       deceased 30  . Leukemia Maternal Grandmother        deceased 63  . Skin cancer Paternal Grandmother   . Prostate cancer Other    the patient's father died from colon cancer the age of 81. It had been diagnosed 3 years prior. The patient's mother was diagnosed with cancer of the uterus at age 57. She is 49 years old as of August 2015. The patient had no brothers or sisters. There is a history of prostate cancer leukemia at in the family as well as melanoma, but there is no history of breast or ovarian cancer in the family.   GYNECOLOGIC HISTORY:  No LMP recorded. (Menstrual status: Chemotherapy). Menarche age 25. The patient is GX P0. She used oral contraceptives for several years remotely with no complications. She stopped having periods after first cycle of chemotherapy.   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 She lives alone, with  no pets.    ADVANCED DIRECTIVES: Not in place. At her 08/16/2013 visit the patient was given the upper. Documents 2 complete and notarize so she may name her mother, [name] focal as her healthcare power of attorney, which is the patient's intention. Ms. focal can be reached at [phone]. She currently lives in [place] but is planning to move to [place]: Social History   Tobacco Use  . Smoking status: Former Smoker    Quit date: 02/20/2013    Years since quitting: 5.6  . Smokeless tobacco: Never Used  Substance Use Topics  . Alcohol use: Yes    Comment: 1/week  . Drug use: No     Colonoscopy: 03/23/2017/ Dr. Therisa Doyne / normal  PAP: July 2015   Bone density: Never  Lipid panel:  Allergies  Allergen Reactions  . Amoxicillin Rash  . Chocolate Rash and Other (See Comments)    Only Hers[employer] brand    Current Outpatient Medications  Medication Sig Dispense Refill  . emollient (BIAFINE) cream Apply 1 application topically as needed (for radiation).    . metroNIDAZOLE (METROGEL) 1 % gel Apply topically daily.  45 g 0  . Multiple Vitamins-Minerals (MULTI ADULT GUMMIES PO) Take 2 each by mouth daily.     Marland Kitchen spironolactone (ALDACTONE) 25 MG tablet Take 25 mg by mouth 2 (two) times daily.   0  . tamoxifen (NOLVADEX) 20 MG tablet Take 1 tablet (20 mg total) by mouth daily. 90 tablet 4  . venlafaxine XR (EFFEXOR-XR) 37.5 MG 24 hr capsule Take 1 capsule (37.5 mg total) by mouth daily with breakfast. 90 capsule 4   No current facility-administered medications for this visit.     OBJECTIVE: Morbidly obese African American woman who appears well Vitals:   10/20/18 1319  BP: 119/64  Pulse: 88  Resp: 18  Temp: 98.7 F (37.1 C)  SpO2: 99%     Body mass index is 51.34 kg/m.     ECOG FS:1 - Symptomatic but completely ambulatory   Sclerae unicteric, EOMs intact Wearing a mask No cervical or supraclavicular adenopathy Lungs no rales or rhonchi Heart regular  rate and rhythm Abd soft, nontender, positive bowel sounds MSK no focal spinal tenderness, no upper extremity lymphedema Neuro: nonfocal, well oriented, appropriate affect Breasts: The right breast is status post lumpectomy and radiation.  It is a little smaller and a little darker and a little firmer than the left breast all of which is expected.  There is no evidence of recurrence.  Left breast is benign.  Both axillae are benign.   LAB RESULTS:  CMP     Component Value Date/Time   NA 141 10/06/2018 1554   NA 141 08/25/2016 0829   K 4.2 10/06/2018 1554   K 3.9 08/25/2016 0829   CL 107 10/06/2018 1554   CO2 24 10/06/2018 1554   CO2 24 08/25/2016 0829   GLUCOSE 100 (H) 10/06/2018 1554   GLUCOSE 108 08/25/2016 0829   BUN 13 10/06/2018 1554   BUN 11.2 08/25/2016 0829   CREATININE 0.86 10/06/2018 1554   CREATININE 0.9 08/25/2016 0829   CALCIUM 9.0 10/06/2018 1554   CALCIUM 9.3 08/25/2016 0829   PROT 7.3 10/06/2018 1554   PROT 6.9 08/25/2016 0829   ALBUMIN 3.8 10/06/2018 1554   ALBUMIN 3.2 (L) 08/25/2016 0829   AST 17 10/06/2018 1554   AST 24 08/25/2016 0829   ALT 19 10/06/2018 1554   ALT 24 08/25/2016 0829   ALKPHOS 79 10/06/2018 1554   ALKPHOS 78 08/25/2016 0829   BILITOT <0.2 (L) 10/06/2018 1554   BILITOT <0.22 08/25/2016 0829   GFRNONAA >60 10/06/2018 1554   GFRAA >60 10/06/2018 1554    I No results found for: SPEP  Lab Results  Component Value Date   WBC 8.5 10/06/2018   NEUTROABS 4.1 10/06/2018   HGB 12.5 10/06/2018   HCT 39.4 10/06/2018   MCV 85.8 10/06/2018   PLT 233 10/06/2018      Chemistry      Component Value Date/Time   NA 141 10/06/2018 1554   NA 141 08/25/2016 0829   K 4.2 10/06/2018 1554   K 3.9 08/25/2016 0829   CL 107 10/06/2018 1554   CO2 24 10/06/2018 1554   CO2 24 08/25/2016 0829   BUN 13 10/06/2018 1554   BUN 11.2 08/25/2016 0829   CREATININE 0.86 10/06/2018 1554   CREATININE 0.9 08/25/2016 0829      Component Value Date/Time    CALCIUM 9.0 10/06/2018 1554   CALCIUM 9.3 08/25/2016 0829   ALKPHOS 79 10/06/2018 1554   ALKPHOS 78 08/25/2016 0829   AST 17 10/06/2018 1554  AST 24 08/25/2016 0829   ALT 19 10/06/2018 1554   ALT 24 08/25/2016 0829   BILITOT <0.2 (L) 10/06/2018 1554   BILITOT <0.22 08/25/2016 0829       No results found for: LABCA2  No components found for: TSVXB939  No results for input(s): INR in the last 168 hours.  Urinalysis    Component Value Date/Time   COLORURINE ORANGE (A) 06/22/2014 0019    STUDIES: Mm Diag Breast Tomo Bilateral  Result Date: 10/17/2018 CLINICAL DATA:  48 year old female presenting for routine annual surveillance status post right breast lumpectomy in 2016. EXAM: DIGITAL DIAGNOSTIC BILATERAL MAMMOGRAM WITH CAD AND TOMO COMPARISON:  Previous exam(s). ACR Breast Density Category a: The breast tissue is almost entirely fatty. FINDINGS: The right breast lumpectomy site is stable. No suspicious calcifications, masses or areas of distortion are seen in the bilateral breasts. Mammographic images were processed with CAD. IMPRESSION: Stable right breast lumpectomy site. No mammographic evidence of malignancy in the bilateral breasts. RECOMMENDATION: Diagnostic mammogram is suggested in 1 year. (Code:[id number]-B-01Y) I have discussed the findings and recommendations with the patient. If applicable, a reminder letter will be sent to the patient regarding the next appointment. BI-RADS CATEGORY  2: Benign. Electronically Signed   By: Ammie Ferrier M.D.   On: 10/17/2018 16:09    ASSESSMENT: 48 y.o. BRCA negative [place] woman status post right breast upper outer quadrant biopsy 08/08/2013 for a triple positive invasive ductal carcinoma, grade 2, with an MIB-1 of 52%, and a suspicious lymph node biopsied at the same time pathologically benign.  (1) genetics testing found no deleterious mutations in ATM, BARD1, BRCA1, BRCA2, BRIP1, CDH1, CHEK2, MRE11A, MUTYH, NBN, NF1, PALB2, PTEN,  RAD50, RAD51C, RAD51D, and TP53  (2) neoadjuvant chemotherapy consisted of carboplatin, docetaxel, trastuzumab and pertuzumab for 6 cycles, completed 12/21/2013  (a) pertuzumab discontinued after cycle 1 because of severe diarrhea  (b) carboplatin switched for cyclophosphamide because of hypersensitivity reaction on 11/20/13  (c) docetaxel removed from final cycle because of peripheral neuropathy symptoms.  (3) trastuzumab continued to complete one year (through August 2016)  (a) repeat echocardiogram 06/25/2014 shows a well preserved ejection fraction  (4) status post right lumpectomy and sentinel lymph node sampling 01/25/2014 showing a residual pT1b pN0 invasive ductal carcinoma, grade 2, again HER-2 amplified  (5) adjuvant radiation completed 05/03/2014  (6) tamoxifen started May 2016   PLAN: Sumayyah is now nearly 5 years out from definitive surgery for her breast cancer with no evidence of disease recurrence.  This is very favorable.  She is tolerating tamoxifen well enough that she might consider going off the venlafaxine.  If she does she understands she needs to taper off and not simply stop.  We discussed how to do that.  The plan is to continue tamoxifen for a total of 10 years.  She has not had a period since chemotherapy.  Nevertheless her [place] and estradiol levels show her still to be premenopausal.  She understands that tamoxifen is not a contraceptive.  I have encouraged her to talk multiple short walks during the day.  She knows to call for any other issues before the next visit which will be in 1 year.   , Virgie Dad, MD  10/20/18 1:49 PM Medical Oncology and Hematology [facility] [address] [place], [place] [address] Tel. [phone]    Fax. [phone]   I, Wilburn Mylar, am acting as scribe for Dr. Virgie Dad. .  Lindie Spruce MD, have reviewed  the above documentation for accuracy and completeness, and I agree with  the above.

## 2018-10-20 ENCOUNTER — Inpatient Hospital Stay: Payer: BC Managed Care – PPO | Attending: Oncology | Admitting: Oncology

## 2018-10-20 ENCOUNTER — Other Ambulatory Visit: Payer: Self-pay

## 2018-10-20 VITALS — BP 119/64 | HR 88 | Temp 98.7°F | Resp 18 | Wt 308.5 lb

## 2018-10-20 DIAGNOSIS — Z79899 Other long term (current) drug therapy: Secondary | ICD-10-CM | POA: Diagnosis not present

## 2018-10-20 DIAGNOSIS — Z87442 Personal history of urinary calculi: Secondary | ICD-10-CM | POA: Insufficient documentation

## 2018-10-20 DIAGNOSIS — Z79810 Long term (current) use of selective estrogen receptor modulators (SERMs): Secondary | ICD-10-CM | POA: Diagnosis not present

## 2018-10-20 DIAGNOSIS — E1142 Type 2 diabetes mellitus with diabetic polyneuropathy: Secondary | ICD-10-CM | POA: Insufficient documentation

## 2018-10-20 DIAGNOSIS — Z808 Family history of malignant neoplasm of other organs or systems: Secondary | ICD-10-CM | POA: Diagnosis not present

## 2018-10-20 DIAGNOSIS — K219 Gastro-esophageal reflux disease without esophagitis: Secondary | ICD-10-CM | POA: Insufficient documentation

## 2018-10-20 DIAGNOSIS — C50411 Malignant neoplasm of upper-outer quadrant of right female breast: Secondary | ICD-10-CM | POA: Diagnosis not present

## 2018-10-20 DIAGNOSIS — Z8049 Family history of malignant neoplasm of other genital organs: Secondary | ICD-10-CM | POA: Insufficient documentation

## 2018-10-20 DIAGNOSIS — Z87891 Personal history of nicotine dependence: Secondary | ICD-10-CM | POA: Insufficient documentation

## 2018-10-20 DIAGNOSIS — Z923 Personal history of irradiation: Secondary | ICD-10-CM | POA: Insufficient documentation

## 2018-10-20 DIAGNOSIS — Z806 Family history of leukemia: Secondary | ICD-10-CM | POA: Diagnosis not present

## 2018-10-20 DIAGNOSIS — Z800 Family history of malignant neoplasm of digestive organs: Secondary | ICD-10-CM | POA: Diagnosis not present

## 2018-10-20 DIAGNOSIS — Z9221 Personal history of antineoplastic chemotherapy: Secondary | ICD-10-CM | POA: Insufficient documentation

## 2018-10-20 DIAGNOSIS — Z170 Estrogen receptor positive status [ER+]: Secondary | ICD-10-CM | POA: Insufficient documentation

## 2018-10-20 MED ORDER — VENLAFAXINE HCL ER 37.5 MG PO CP24: 37.5000 mg | ORAL_CAPSULE | Freq: Every day | ORAL | 4 refills | Status: DC

## 2018-10-20 MED ORDER — METRONIDAZOLE 1 % EX GEL: Freq: Every day | CUTANEOUS | 0 refills | Status: AC

## 2018-10-20 MED ORDER — TAMOXIFEN CITRATE 20 MG PO TABS: 20.0000 mg | ORAL_TABLET | Freq: Every day | ORAL | 4 refills | Status: DC

## 2018-10-20 MED FILL — metroNIDAZOLE 1 % GEL: 1 | 30 days supply | Qty: 60 | Fill #0

## 2018-10-20 MED FILL — TAMOXIFEN 20 MG TABLET: 20 | 90 days supply | Qty: 90 | Fill #0

## 2018-10-20 MED FILL — VENLAFAXINE HCL ER 37.5 MG: 37.5 | 90 days supply | Qty: 90 | Fill #0

## 2018-10-21 ENCOUNTER — Telehealth: Payer: Self-pay | Admitting: Oncology

## 2018-10-21 NOTE — Telephone Encounter (Signed)
I talk with patient regarding schedule  

## 2019-03-03 MED FILL — TAMOXIFEN 20 MG TABLET: 20 | 30 days supply | Qty: 30 | Fill #1

## 2019-03-03 MED FILL — VENLAFAXINE HCL ER 37.5 MG: 37.5 | 30 days supply | Qty: 30 | Fill #1

## 2019-03-04 MED FILL — SPIRONOLACTONE 25 MG TABS: 25 | 30 days supply | Qty: 60 | Fill #0

## 2019-05-12 MED FILL — VENLAFAXINE HCL ER 37.5 MG: 37.5 | 30 days supply | Qty: 30 | Fill #2

## 2019-05-12 MED FILL — SPIRONOLACTONE 25 MG TABS: 25 | 30 days supply | Qty: 60 | Fill #0

## 2019-05-12 MED FILL — TAMOXIFEN 20 MG TABLET: 20 | 30 days supply | Qty: 30 | Fill #2

## 2019-07-13 MED FILL — TAMOXIFEN 20 MG TABLET: 20 | 30 days supply | Qty: 30 | Fill #3

## 2019-07-13 MED FILL — SPIRONOLACTONE 25 MG TABS: 25 | 30 days supply | Qty: 60 | Fill #1

## 2019-07-13 MED FILL — VENLAFAXINE HCL ER 37.5 MG: 37.5 | 30 days supply | Qty: 30 | Fill #3

## 2019-08-03 MED FILL — OZEMPIC 0.25 OR 0.5 MG/DOSE: 2 | 30 days supply | Qty: 2 | Fill #0

## 2019-09-06 MED FILL — OZEMPIC 0.25 OR 0.5 MG/DOSE: 2 | 28 days supply | Qty: 2 | Fill #1

## 2019-09-06 MED FILL — VENLAFAXINE HCL ER 37.5 MG: 37.5 | 30 days supply | Qty: 30 | Fill #4

## 2019-09-06 MED FILL — SPIRONOLACTONE 25 MG TABS: 25 | 30 days supply | Qty: 60 | Fill #2

## 2019-09-06 MED FILL — TAMOXIFEN 20 MG TABLET: 20 | 30 days supply | Qty: 30 | Fill #4

## 2019-09-28 ENCOUNTER — Other Ambulatory Visit: Payer: Self-pay | Admitting: Adult Health

## 2019-09-28 ENCOUNTER — Other Ambulatory Visit: Payer: Self-pay | Admitting: Oncology

## 2019-09-28 DIAGNOSIS — Z9889 Other specified postprocedural states: Secondary | ICD-10-CM

## 2019-09-28 DIAGNOSIS — Z853 Personal history of malignant neoplasm of breast: Secondary | ICD-10-CM

## 2019-10-18 ENCOUNTER — Ambulatory Visit
Admission: RE | Admit: 2019-10-18 | Discharge: 2019-10-18 | Disposition: A | Discharge status: Home / Self Care | Payer: 59 | Source: Ambulatory Visit | Attending: Oncology | Admitting: Oncology

## 2019-10-18 ENCOUNTER — Other Ambulatory Visit: Payer: Self-pay

## 2019-10-18 DIAGNOSIS — Z853 Personal history of malignant neoplasm of breast: Secondary | ICD-10-CM

## 2019-10-18 DIAGNOSIS — Z9889 Other specified postprocedural states: Secondary | ICD-10-CM

## 2019-10-20 ENCOUNTER — Other Ambulatory Visit: Payer: Self-pay | Admitting: Oncology

## 2019-10-20 ENCOUNTER — Other Ambulatory Visit: Payer: Self-pay

## 2019-10-20 DIAGNOSIS — C50411 Malignant neoplasm of upper-outer quadrant of right female breast: Secondary | ICD-10-CM

## 2019-10-20 DIAGNOSIS — Z170 Estrogen receptor positive status [ER+]: Secondary | ICD-10-CM

## 2019-10-20 MED ORDER — TAMOXIFEN CITRATE 20 MG PO TABS: 20.0000 mg | ORAL_TABLET | Freq: Every day | ORAL | 4 refills | Status: DC

## 2019-10-20 MED ORDER — VENLAFAXINE HCL ER 37.5 MG PO CP24: 37.5000 mg | ORAL_CAPSULE | Freq: Every day | ORAL | 0 refills | Status: DC

## 2019-10-20 MED FILL — SPIRONOLACTONE 25 MG TABS: 25 | 30 days supply | Qty: 60 | Fill #0

## 2019-10-22 NOTE — Progress Notes (Signed)
[name]  [phone] [phone]     ID: [name] DOB: 05-07-1970  MR#: [id number]  ZYS#:[id number]  Patient Care Team: Deland Pretty, MD as PCP - General (Internal Medicine) Erroll Luna, MD as Consulting Physician (General Surgery) Fumiko Cham, Virgie Dad, MD as Consulting Physician (Oncology) Everlene Farrier, MD as Consulting Physician (Obstetrics and Gynecology)  CHIEF COMPLAINT:  triple positive breast cancer  CURRENT TREATMENT:  Tamoxifen   INTERVAL HISTORY: [name] returns today for follow-up of her estrogen receptor positive breast cancer.   She continues on tamoxifen.  She takes this currently with essentially no side effects.  Since her last visit, she underwent bilateral diagnostic mammography with tomography at [facility] on 10/18/2019 showing: breast density category A; no evidence of malignancy in either breast.   REVIEW OF SYSTEMS: [name] received both doses of the Covid vaccine and tolerated them well.  She is working from home.  She is considering moving to [place] in the near future to be with her mom in [place].  Her mom has been diagnosed with pulmonary hypertension.  [name] tried [employer] for weight loss but really does not like it and is coming off it gradually.  Aside from these issues a detailed review of systems today was stable   BREAST CANCER HISTORY: From the original intake note:  The patient herself palpated a mass in her right breast mid July. She brought it to Dr. Sherlynn Stalls attention and he set the patient up for bilateral diagnostic mammography and right breast ultrasonography of the breast Center 07/28/2013. Right mammogram showed an irregular mass in the upper outer quadrant measuring 1.4 cm. There was some associated pleomorphic microcalcifications. This was palpable, at the 10:30 position 11 cm from the nipple. By palpation it measured approximately 1.5 cm. Ultrasound confirmed an hypoechoic mass  with irregular borders measuring 1.1 cm. The right axilla showed a few adjacent deep lymph nodes with mild cortical thickening.  On 08/08/2013 the patient underwent biopsy of the breast mass in question. The pathology (SAA [id number]) showed an invasive ductal carcinoma, grade 2, estrogen receptor 49% positive, progesterone receptor 47% positive, both with moderate staining intensity, with an MIB-1 of 52%, and HER-2 amplification with a signals ratio of 8.1. The right axillary lymph node biopsied at the same time was benign this was felt to be possibly concordant.  And 08/15/2013 the patient underwent bilateral breast MRI showing a 2 cm irregular enhancing mass in the upper outer quadrant of the right breast. There were no additional changes no abnormal appearing lymph nodes and no masses or abnormalities in the left breast.  The patient's subsequent history is as detailed below   PAST MEDICAL HISTORY: Past Medical History:  Diagnosis Date  . Breast cancer ([place])   . GERD (gastroesophageal reflux disease)    years ago  . History of kidney stones   . Malignant neoplasm of breast (female), unspecified site   . Personal history of chemotherapy   . Personal history of radiation therapy   . Poor circulation    in legs  . S/P radiation therapy 03/19/2014 through 05/03/2014    Right breast 4500 cGy in 25 sessions, right breast boost 1600 cGy in 8 sessions   . Snores   . Wears contact lenses     PAST SURGICAL HISTORY: Past Surgical History:  Procedure Laterality Date  . AXILLARY SENTINEL NODE BIOPSY Right 01/25/2014   Procedure: RIGHT SENTINEL LYMPH NODE MAPPING;  Surgeon: Erroll Luna, MD;  Location: [facility];  Service: General;  Laterality: Right;  . BREAST BIOPSY    . BREAST LUMPECTOMY Right    2016  . BREAST LUMPECTOMY WITH RADIOACTIVE SEED LOCALIZATION Right 01/25/2014   Procedure: RIGHT BREAST  LUMPECTOMY WITH RADIOACTIVE SEED LOCALIZATION ;  Surgeon: Erroll Luna, MD;  Location: [facility];  Service: General;  Laterality: Right;  . COLONOSCOPY N/A 03/23/2017   Procedure: COLONOSCOPY;  Surgeon: [name], MD;  Location: [facility];  Service: Gastroenterology;  Laterality: N/A;  . PORT-A-CATH REMOVAL N/A 03/07/2015   Procedure: REMOVAL PORT-A-CATH;  Surgeon: Erroll Luna, MD;  Location: [facility];  Service: General;  Laterality: N/A;  . PORTACATH PLACEMENT N/A 08/23/2013   Procedure: INSERTION PORT-A-CATH WITH ULTRA SOUND;  Surgeon: Joyice Faster. Cornett, MD;  Location: [facility];  Service: General;  Laterality: N/A;  . WISDOM TOOTH EXTRACTION      FAMILY HISTORY Family History  Problem Relation Age of Onset  . Uterine cancer Mother 61       TAH/BSO  . Colon cancer Father 70       deceased 68  . Leukemia Maternal Grandmother        deceased 67  . Skin cancer Paternal Grandmother   . Prostate cancer Other    the patient's father died from colon cancer the age of 71. It had been diagnosed 3 years prior. The patient's mother was diagnosed with cancer of the uterus at age 46. She is 50 years old as of August 2015. The patient had no brothers or sisters. There is a history of prostate cancer leukemia at in the family as well as melanoma, but there is no history of breast or ovarian cancer in the family.   GYNECOLOGIC HISTORY:  No LMP recorded. (Menstrual status: Chemotherapy). Menarche age 26. The patient is GX P0. She used oral contraceptives for several years remotely with no complications. She stopped having periods after first cycle of chemotherapy.   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 She lives alone, with no pets.    ADVANCED DIRECTIVES: Not in place. At her 08/16/2013 visit the patient was given the appropriate documents 2 complete and notarize so she may name her mother, [name] focal as her  healthcare power of attorney, which is the patient's intention. She can be reached at [phone]. She currently lives in [place]: Social History   Tobacco Use  . Smoking status: Former Smoker    Quit date: 02/20/2013    Years since quitting: 6.6  . Smokeless tobacco: Never Used  Substance Use Topics  . Alcohol use: Yes    Comment: 1/week  . Drug use: No     Colonoscopy: 03/23/2017/ Dr. Therisa Doyne / normal  PAP: July 2015   Bone density: Never  Lipid panel:  Allergies  Allergen Reactions  . Amoxicillin Rash  . Chocolate Rash and Other (See Comments)    Only [employer] brand    Current Outpatient Medications  Medication Sig Dispense Refill  . emollient (BIAFINE) cream Apply 1 application topically as needed (for radiation).    . metroNIDAZOLE (METROGEL) 1 % gel Apply topically daily. 45 g 0  . Multiple Vitamins-Minerals (MULTI ADULT GUMMIES PO) Take 2 each by mouth daily.     Marland Kitchen spironolactone (ALDACTONE) 25 MG tablet Take 25 mg by mouth 2 (two) times daily.   0  . tamoxifen (NOLVADEX) 20 MG tablet Take 1  tablet (20 mg total) by mouth daily. 90 tablet 0  . venlafaxine XR (EFFEXOR-XR) 37.5 MG 24 hr capsule TAKE 1 CAPSULE (37.5 MG TOTAL) BY MOUTH DAILY WITH BREAKFAST. 30 capsule 4   No current facility-administered medications for this visit.    OBJECTIVE: African-American woman who appears well  Vitals:   10/23/19 1336  BP: (!) 123/99  Pulse: 92  Resp: 18  Temp: 98.1 F (36.7 C)  SpO2: 96%     Body mass index is 53.75 kg/m.     ECOG FS:1 - Symptomatic but completely ambulatory   Sclerae unicteric, EOMs intact Wearing a mask No cervical or supraclavicular adenopathy Lungs no rales or rhonchi Heart regular rate and rhythm Abd soft, nontender, positive bowel sounds MSK no focal spinal tenderness, no upper extremity lymphedema Neuro: nonfocal, well oriented, appropriate affect Breasts: The right breast has undergone lumpectomy  followed by radiation.  It is firmer than the left but there is no evidence of local recurrence.  Both axillae are benign.   LAB RESULTS:  CMP     Component Value Date/Time   NA 141 10/06/2018 1554   NA 141 08/25/2016 0829   K 4.2 10/06/2018 1554   K 3.9 08/25/2016 0829   CL 107 10/06/2018 1554   CO2 24 10/06/2018 1554   CO2 24 08/25/2016 0829   GLUCOSE 100 (H) 10/06/2018 1554   GLUCOSE 108 08/25/2016 0829   BUN 13 10/06/2018 1554   BUN 11.2 08/25/2016 0829   CREATININE 0.86 10/06/2018 1554   CREATININE 0.9 08/25/2016 0829   CALCIUM 9.0 10/06/2018 1554   CALCIUM 9.3 08/25/2016 0829   PROT 7.3 10/06/2018 1554   PROT 6.9 08/25/2016 0829   ALBUMIN 3.8 10/06/2018 1554   ALBUMIN 3.2 (L) 08/25/2016 0829   AST 17 10/06/2018 1554   AST 24 08/25/2016 0829   ALT 19 10/06/2018 1554   ALT 24 08/25/2016 0829   ALKPHOS 79 10/06/2018 1554   ALKPHOS 78 08/25/2016 0829   BILITOT <0.2 (L) 10/06/2018 1554   BILITOT <0.22 08/25/2016 0829   GFRNONAA >60 10/06/2018 1554   GFRAA >60 10/06/2018 1554    I No results found for: SPEP  Lab Results  Component Value Date   WBC 7.6 10/23/2019   NEUTROABS 3.7 10/23/2019   HGB 12.0 10/23/2019   HCT 38.0 10/23/2019   MCV 84.3 10/23/2019   PLT 289 10/23/2019      Chemistry      Component Value Date/Time   NA 141 10/06/2018 1554   NA 141 08/25/2016 0829   K 4.2 10/06/2018 1554   K 3.9 08/25/2016 0829   CL 107 10/06/2018 1554   CO2 24 10/06/2018 1554   CO2 24 08/25/2016 0829   BUN 13 10/06/2018 1554   BUN 11.2 08/25/2016 0829   CREATININE 0.86 10/06/2018 1554   CREATININE 0.9 08/25/2016 0829      Component Value Date/Time   CALCIUM 9.0 10/06/2018 1554   CALCIUM 9.3 08/25/2016 0829   ALKPHOS 79 10/06/2018 1554   ALKPHOS 78 08/25/2016 0829   AST 17 10/06/2018 1554   AST 24 08/25/2016 0829   ALT 19 10/06/2018 1554   ALT 24 08/25/2016 0829   BILITOT <0.2 (L) 10/06/2018 1554   BILITOT <0.22 08/25/2016 0829       No results  found for: LABCA2  No components found for: LABCA125  No results for input(s): INR in the last 168 hours.  Urinalysis    Component Value Date/Time   COLORURINE ORANGE (  A) 06/22/2014 0019    STUDIES: MM DIAG BREAST TOMO BILATERAL  Result Date: 10/18/2019 CLINICAL DATA:  49 year old who underwent malignant lumpectomy of the UPPER OUTER QUADRANT of the RIGHT breast in January, 2016 after neoadjuvant chemotherapy. Adjuvant radiation therapy. Annual evaluation. EXAM: DIGITAL DIAGNOSTIC BILATERAL MAMMOGRAM WITH TOMO AND CAD COMPARISON:  Previous exam(s). ACR Breast Density Category a: The breast tissue is almost entirely fatty. FINDINGS: Tomosynthesis and synthesized full field CC and MLO views of both breasts were obtained. Standard spot magnification MLO view of the lumpectomy site in the RIGHT breast was also obtained. Post surgical scar/architectural distortion at the lumpectomy site in the UPPER OUTER RIGHT breast at POSTERIOR depth. No new or suspicious findings in the RIGHT breast. No findings suspicious for malignancy in the LEFT breast. Mammographic images were processed with CAD. IMPRESSION: 1. No mammographic evidence of malignancy involving either breast. 2. Expected post lumpectomy changes involving the RIGHT breast. RECOMMENDATION: As the patient is now 5 years out from her lumpectomy, she may return to annual screening. Screening mammogram in one year is recommended.(Code:SM-B-01Y) I have discussed the findings and recommendations with the patient. If applicable, a reminder letter will be sent to the patient regarding the next appointment. BI-RADS CATEGORY  2: Benign. Electronically Signed   By: Evangeline Dakin M.D.   On: 10/18/2019 16:13    ASSESSMENT: 49 y.o. BRCA negative [place] woman status post right breast upper outer quadrant biopsy 08/08/2013 for a triple positive invasive ductal carcinoma, grade 2, with an MIB-1 of 52%, and a suspicious lymph node biopsied at the same time  pathologically benign.  (1) genetics testing found no deleterious mutations in ATM, BARD1, BRCA1, BRCA2, BRIP1, CDH1, CHEK2, MRE11A, MUTYH, NBN, NF1, PALB2, PTEN, RAD50, RAD51C, RAD51D, and TP53  (2) neoadjuvant chemotherapy consisted of carboplatin, docetaxel, trastuzumab and pertuzumab for 6 cycles, completed 12/21/2013  (a) pertuzumab discontinued after cycle 1 because of severe diarrhea  (b) carboplatin switched for cyclophosphamide because of hypersensitivity reaction on 11/20/13  (c) docetaxel removed from final cycle because of peripheral neuropathy symptoms.  (3) trastuzumab continued to complete one year (through August 2016)  (a) repeat echocardiogram 06/25/2014 shows a well preserved ejection fraction  (4) status post right lumpectomy and sentinel lymph node sampling 01/25/2014 showing a residual pT1b pN0 invasive ductal carcinoma, grade 2, again HER-2 amplified  (5) adjuvant radiation completed 05/03/2014  (6) tamoxifen started May 2016   PLAN: Heavyn is now nearly 6 years out from definitive surgery for her breast cancer with no evidence of disease recurrence.  This is very favorable.  The plan is to continue tamoxifen for a total of 10 years.  She is tolerating it well and I refill that for her today.  I also refilled her venlafaxine.  She will be moving to [place] soon to be with her mother.  In case she establishes herself with a physician there I gave her a copy of our summary above as well as her most recent mammogram and labs.  Otherwise however she would like to continue to be followed here and I have made her a return appointment with me in October  We discussed diet issues.  Total encounter time 25 minutes.*  Lora Chavers, Virgie Dad, MD  10/23/19 1:38 PM Medical Oncology and Hematology [facility] [place], [place] [address] Tel. [phone]    Fax. [phone]   I, Wilburn Mylar, am acting as scribe for Dr. Virgie Dad.  Aerion Bagdasarian.  Lindie Spruce MD,  have reviewed the above documentation for accuracy and completeness, and I agree with the above.   *Total Encounter Time as defined by the Centers for Medicare and Medicaid Services includes, in addition to the face-to-face time of a patient visit (documented in the note above) non-face-to-face time: obtaining and reviewing outside history, ordering and reviewing medications, tests or procedures, care coordination (communications with other health care professionals or caregivers) and documentation in the medical record.

## 2019-10-23 ENCOUNTER — Other Ambulatory Visit: Payer: Self-pay

## 2019-10-23 ENCOUNTER — Inpatient Hospital Stay: Payer: 59 | Attending: Oncology | Admitting: Oncology

## 2019-10-23 ENCOUNTER — Inpatient Hospital Stay: Payer: 59

## 2019-10-23 VITALS — BP 123/99 | HR 92 | Temp 98.1°F | Resp 18 | Ht 65.0 in | Wt 323.0 lb

## 2019-10-23 DIAGNOSIS — C50411 Malignant neoplasm of upper-outer quadrant of right female breast: Secondary | ICD-10-CM

## 2019-10-23 DIAGNOSIS — Z9221 Personal history of antineoplastic chemotherapy: Secondary | ICD-10-CM | POA: Diagnosis not present

## 2019-10-23 DIAGNOSIS — Z87891 Personal history of nicotine dependence: Secondary | ICD-10-CM | POA: Insufficient documentation

## 2019-10-23 DIAGNOSIS — Z923 Personal history of irradiation: Secondary | ICD-10-CM | POA: Insufficient documentation

## 2019-10-23 DIAGNOSIS — Z79810 Long term (current) use of selective estrogen receptor modulators (SERMs): Secondary | ICD-10-CM | POA: Diagnosis not present

## 2019-10-23 DIAGNOSIS — Z170 Estrogen receptor positive status [ER+]: Secondary | ICD-10-CM | POA: Diagnosis not present

## 2019-10-23 LAB — CBC WITH DIFFERENTIAL (CANCER CENTER ONLY)
Abs Immature Granulocytes: 0.02 10*3/uL (ref 0.00–0.07)
Basophils Absolute: 0.1 10*3/uL (ref 0.0–0.1)
Basophils Relative: 1 %
Eosinophils Absolute: 0.1 10*3/uL (ref 0.0–0.5)
Eosinophils Relative: 1 %
HCT: 38 % (ref 36.0–46.0)
Hemoglobin: 12 g/dL (ref 12.0–15.0)
Immature Granulocytes: 0 %
Lymphocytes Relative: 44 %
Lymphs Abs: 3.3 10*3/uL (ref 0.7–4.0)
MCH: 26.6 pg (ref 26.0–34.0)
MCHC: 31.6 g/dL (ref 30.0–36.0)
MCV: 84.3 fL (ref 80.0–100.0)
Monocytes Absolute: 0.4 10*3/uL (ref 0.1–1.0)
Monocytes Relative: 6 %
Neutro Abs: 3.7 10*3/uL (ref 1.7–7.7)
Neutrophils Relative %: 48 %
Platelet Count: 289 10*3/uL (ref 150–400)
RBC: 4.51 MIL/uL (ref 3.87–5.11)
RDW: 15.7 % — ABNORMAL HIGH (ref 11.5–15.5)
WBC Count: 7.6 10*3/uL (ref 4.0–10.5)
nRBC: 0 % (ref 0.0–0.2)

## 2019-10-23 LAB — CMP (CANCER CENTER ONLY)
ALT: 18 U/L (ref 0–44)
AST: 15 U/L (ref 15–41)
Albumin: 3.4 g/dL — ABNORMAL LOW (ref 3.5–5.0)
Alkaline Phosphatase: 67 U/L (ref 38–126)
Anion gap: 6 (ref 5–15)
BUN: 10 mg/dL (ref 6–20)
CO2: 24 mmol/L (ref 22–32)
Calcium: 8.9 mg/dL (ref 8.9–10.3)
Chloride: 109 mmol/L (ref 98–111)
Creatinine: 0.8 mg/dL (ref 0.44–1.00)
GFR, Estimated: >60 mL/min (ref >60)
Glucose, Bld: 90 mg/dL (ref 70–99)
Potassium: 3.9 mmol/L (ref 3.5–5.1)
Sodium: 139 mmol/L (ref 135–145)
Total Bilirubin: <0.2 mg/dL — ABNORMAL LOW (ref 0.3–1.2)
Total Protein: 7.1 g/dL (ref 6.5–8.1)

## 2019-10-23 MED ORDER — TAMOXIFEN CITRATE 20 MG PO TABS: 20.0000 mg | ORAL_TABLET | Freq: Every day | ORAL | 0 refills | Status: DC

## 2019-10-23 MED ORDER — VENLAFAXINE HCL ER 37.5 MG PO CP24: 37.5000 mg | ORAL_CAPSULE | Freq: Every day | ORAL | 4 refills | Status: DC

## 2019-10-23 MED ORDER — TAMOXIFEN CITRATE 20 MG PO TABS: 20.0000 mg | ORAL_TABLET | Freq: Every day | ORAL | 4 refills | Status: DC

## 2019-10-23 MED FILL — TAMOXIFEN 20 MG TABLET: 20 | 30 days supply | Qty: 30 | Fill #0

## 2019-10-24 ENCOUNTER — Telehealth: Payer: Self-pay | Admitting: Oncology

## 2019-10-24 NOTE — Telephone Encounter (Signed)
Scheduled Per 10/12 LOS  - spoke with patient she is aware

## 2019-12-26 ENCOUNTER — Other Ambulatory Visit: Payer: Self-pay | Admitting: Oncology

## 2020-10-23 ENCOUNTER — Other Ambulatory Visit: Payer: Self-pay | Admitting: Oncology

## 2020-10-23 DIAGNOSIS — Z1231 Encounter for screening mammogram for malignant neoplasm of breast: Secondary | ICD-10-CM

## 2020-10-28 ENCOUNTER — Ambulatory Visit
Admission: RE | Admit: 2020-10-28 | Discharge: 2020-10-28 | Disposition: A | Discharge status: Home / Self Care | Payer: 59 | Source: Ambulatory Visit | Attending: Oncology | Admitting: Oncology

## 2020-10-28 ENCOUNTER — Other Ambulatory Visit: Payer: Self-pay

## 2020-10-28 DIAGNOSIS — Z1231 Encounter for screening mammogram for malignant neoplasm of breast: Secondary | ICD-10-CM

## 2020-10-29 ENCOUNTER — Other Ambulatory Visit: Payer: Self-pay | Admitting: *Deleted

## 2020-10-29 DIAGNOSIS — C50411 Malignant neoplasm of upper-outer quadrant of right female breast: Secondary | ICD-10-CM

## 2020-10-30 ENCOUNTER — Other Ambulatory Visit: Payer: Self-pay

## 2020-10-30 ENCOUNTER — Inpatient Hospital Stay: Payer: 59

## 2020-10-30 ENCOUNTER — Inpatient Hospital Stay: Payer: 59 | Attending: Oncology | Admitting: Oncology

## 2020-10-30 VITALS — BP 110/70 | HR 83 | Temp 97.5°F | Resp 18 | Ht 65.0 in | Wt 322.7 lb

## 2020-10-30 DIAGNOSIS — Z9221 Personal history of antineoplastic chemotherapy: Secondary | ICD-10-CM | POA: Diagnosis not present

## 2020-10-30 DIAGNOSIS — Z170 Estrogen receptor positive status [ER+]: Secondary | ICD-10-CM | POA: Diagnosis not present

## 2020-10-30 DIAGNOSIS — C50411 Malignant neoplasm of upper-outer quadrant of right female breast: Secondary | ICD-10-CM

## 2020-10-30 DIAGNOSIS — Z923 Personal history of irradiation: Secondary | ICD-10-CM | POA: Diagnosis not present

## 2020-10-30 LAB — CBC WITH DIFFERENTIAL (CANCER CENTER ONLY)
Abs Immature Granulocytes: 0.01 10*3/uL (ref 0.00–0.07)
Basophils Absolute: 0.1 10*3/uL (ref 0.0–0.1)
Basophils Relative: 1 %
Eosinophils Absolute: 0.1 10*3/uL (ref 0.0–0.5)
Eosinophils Relative: 1 %
HCT: 37.5 % (ref 36.0–46.0)
Hemoglobin: 11.9 g/dL — ABNORMAL LOW (ref 12.0–15.0)
Immature Granulocytes: 0 %
Lymphocytes Relative: 45 %
Lymphs Abs: 2.7 10*3/uL (ref 0.7–4.0)
MCH: 26.6 pg (ref 26.0–34.0)
MCHC: 31.7 g/dL (ref 30.0–36.0)
MCV: 83.9 fL (ref 80.0–100.0)
Monocytes Absolute: 0.5 10*3/uL (ref 0.1–1.0)
Monocytes Relative: 8 %
Neutro Abs: 2.7 10*3/uL (ref 1.7–7.7)
Neutrophils Relative %: 45 %
Platelet Count: 241 10*3/uL (ref 150–400)
RBC: 4.47 MIL/uL (ref 3.87–5.11)
RDW: 15.4 % (ref 11.5–15.5)
WBC Count: 5.9 10*3/uL (ref 4.0–10.5)
nRBC: 0 % (ref 0.0–0.2)

## 2020-10-30 LAB — CMP (CANCER CENTER ONLY)
ALT: 19 U/L (ref 0–44)
AST: 22 U/L (ref 15–41)
Albumin: 3.7 g/dL (ref 3.5–5.0)
Alkaline Phosphatase: 74 U/L (ref 38–126)
Anion gap: 10 (ref 5–15)
BUN: 10 mg/dL (ref 6–20)
CO2: 23 mmol/L (ref 22–32)
Calcium: 9.1 mg/dL (ref 8.9–10.3)
Chloride: 108 mmol/L (ref 98–111)
Creatinine: 0.86 mg/dL (ref 0.44–1.00)
GFR, Estimated: >60 mL/min (ref >60)
Glucose, Bld: 95 mg/dL (ref 70–99)
Potassium: 4.3 mmol/L (ref 3.5–5.1)
Sodium: 141 mmol/L (ref 135–145)
Total Bilirubin: <0.2 mg/dL — ABNORMAL LOW (ref 0.3–1.2)
Total Protein: 7.2 g/dL (ref 6.5–8.1)

## 2020-10-30 MED ORDER — TAMOXIFEN CITRATE 20 MG PO TABS: 20.0000 mg | ORAL_TABLET | Freq: Every day | ORAL | 5 refills | Status: AC

## 2020-10-30 MED ORDER — VENLAFAXINE HCL ER 37.5 MG PO CP24: 37.5000 mg | ORAL_CAPSULE | Freq: Every day | ORAL | 5 refills | Status: AC

## 2020-10-30 NOTE — Progress Notes (Signed)
[facility]  [phone] [phone]) [phone]     ID: [name] DOB: 26-May-1970  MR#: [id number]  GQQ#:[id number]  Patient Care Team: Deland Pretty, MD as PCP - General (Internal Medicine) Erroll Luna, MD as Consulting Physician (General Surgery) Payslee Bateson, Virgie Dad, MD as Consulting Physician (Oncology) Everlene Farrier, MD as Consulting Physician (Obstetrics and Gynecology)  CHIEF COMPLAINT:  triple positive breast cancer  CURRENT TREATMENT:  Tamoxifen   INTERVAL HISTORY: [name] returns today for follow-up of her estrogen receptor positive breast cancer.   She continues on tamoxifen.  She takes this currently with essentially no side effects.  The plan is to continue it for total of 10 years.  Since her last visit, she underwent bilateral diagnostic mammography with tomography at [facility] on 10/28/2020.  This has not yet been read.  My review finds no abnormality and a low-density, but we will wait on the final report   REVIEW OF SYSTEMS: [name] is working from home.  She has moved to [place] to be closer to her mom and lives a couple of homes away from her.  She has not yet established herself with physicians in [place].  A detailed review of systems today was otherwise stable  COVID: Status post vaccine x2   BREAST CANCER HISTORY: From the original intake note:  The patient herself palpated a mass in her right breast mid July. She brought it to Dr. Sherlynn Stalls attention and he set the patient up for bilateral diagnostic mammography and right breast ultrasonography of the breast Center 07/28/2013. Right mammogram showed an irregular mass in the upper outer quadrant measuring 1.4 cm. There was some associated pleomorphic microcalcifications. This was palpable, at the 10:30 position 11 cm from the nipple. By palpation it measured approximately 1.5 cm. Ultrasound confirmed an hypoechoic mass with irregular borders measuring 1.1 cm. The  right axilla showed a few adjacent deep lymph nodes with mild cortical thickening.  On 08/08/2013 the patient underwent biopsy of the breast mass in question. The pathology ([id number]) showed an invasive ductal carcinoma, grade 2, estrogen receptor 49% positive, progesterone receptor 47% positive, both with moderate staining intensity, with an MIB-1 of 52%, and HER-2 amplification with a signals ratio of 8.1. The right axillary lymph node biopsied at the same time was benign this was felt to be possibly concordant.  And 08/15/2013 the patient underwent bilateral breast MRI showing a 2 cm irregular enhancing mass in the upper outer quadrant of the right breast. There were no additional changes no abnormal appearing lymph nodes and no masses or abnormalities in the left breast.  The patient's subsequent history is as detailed below   PAST MEDICAL HISTORY: Past Medical History:  Diagnosis Date   Breast cancer ([place])    GERD (gastroesophageal reflux disease)    years ago   History of kidney stones    Malignant neoplasm of breast (female), unspecified site    Personal history of chemotherapy    Personal history of radiation therapy    Poor circulation    in legs   S/P radiation therapy 03/19/2014 through 05/03/2014                                                     Right breast 4500 cGy in 25 sessions, right breast boost 1600 cGy in 8  sessions                        Snores    Wears contact lenses     PAST SURGICAL HISTORY: Past Surgical History:  Procedure Laterality Date   AXILLARY SENTINEL NODE BIOPSY Right 01/25/2014   Procedure: RIGHT SENTINEL LYMPH NODE MAPPING;  Surgeon: Erroll Luna, MD;  Location: [facility];  Service: General;  Laterality: Right;   BREAST BIOPSY     BREAST LUMPECTOMY Right    2016   BREAST LUMPECTOMY WITH RADIOACTIVE SEED LOCALIZATION Right 01/25/2014   Procedure: RIGHT BREAST LUMPECTOMY WITH RADIOACTIVE SEED LOCALIZATION ;  Surgeon: Erroll Luna, MD;  Location: [facility];  Service: General;  Laterality: Right;   COLONOSCOPY N/A 03/23/2017   Procedure: COLONOSCOPY;  Surgeon: [name], MD;  Location: [facility];  Service: Gastroenterology;  Laterality: N/A;   PORT-A-CATH REMOVAL N/A 03/07/2015   Procedure: REMOVAL PORT-A-CATH;  Surgeon: Erroll Luna, MD;  Location: [facility];  Service: General;  Laterality: N/A;   PORTACATH PLACEMENT N/A 08/23/2013   Procedure: INSERTION PORT-A-CATH WITH ULTRA SOUND;  Surgeon: Joyice Faster. Cornett, MD;  Location: [facility];  Service: General;  Laterality: N/A;   WISDOM TOOTH EXTRACTION      FAMILY HISTORY Family History  Problem Relation Age of Onset   Uterine cancer Mother 36       TAH/BSO   Colon cancer Father 74       deceased 24   Leukemia Maternal Grandmother        deceased 32   Skin cancer Paternal Grandmother    Prostate cancer Other    the patient's father died from colon cancer the age of 62. It had been diagnosed 3 years prior. The patient's mother was diagnosed with cancer of the uterus at age 36. She is 51 years old as of August 2015. The patient had no brothers or sisters. There is a history of prostate cancer leukemia at in the family as well as melanoma, but there is no history of breast or ovarian cancer in the family.   GYNECOLOGIC HISTORY:  No LMP recorded. (Menstrual status: Chemotherapy). Menarche age 54. The patient is GX P0. She used oral contraceptives for several years remotely with no complications. She stopped having periods after first cycle of chemotherapy.   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 She lives alone, with no pets.    ADVANCED DIRECTIVES: Not in place. At her 08/16/2013 visit the patient was given the appropriate documents 2 complete and notarize so she may name her mother, [name] focal as her healthcare power of attorney, which is the patient's intention. She can be  reached at [phone]. She currently lives in [place]: Social History   Tobacco Use   Smoking status: Former    Types: Cigarettes    Quit date: 02/20/2013    Years since quitting: 7.6   Smokeless tobacco: Never  Substance Use Topics   Alcohol use: Yes    Comment: 1/week   Drug use: No     Colonoscopy: 03/23/2017/ Dr. Therisa Doyne / normal  PAP: July 2015   Bone density: Never  Lipid panel:  Allergies  Allergen Reactions   Amoxicillin Rash   Chocolate Rash and Other (See Comments)    Only [employer] brand    Current Outpatient Medications  Medication Sig Dispense Refill  tamoxifen (NOLVADEX) 20 MG tablet Take 1 tablet (20 mg total) by mouth daily. 90 tablet 5   emollient (BIAFINE) cream Apply 1 application topically as needed (for radiation).     metroNIDAZOLE (METROGEL) 1 % gel Apply topically daily. 45 g 0   Multiple Vitamins-Minerals (MULTI ADULT GUMMIES PO) Take 2 each by mouth daily.      spironolactone (ALDACTONE) 25 MG tablet Take 25 mg by mouth 2 (two) times daily.   0   spironolactone (ALDACTONE) 25 MG tablet TAKE 1 TABLET BY MOUTH TWICE DAILY 180 tablet 2   venlafaxine XR (EFFEXOR-XR) 37.5 MG 24 hr capsule Take 1 capsule (37.5 mg total) by mouth daily with breakfast. 90 capsule 5   No current facility-administered medications for this visit.    OBJECTIVE: African-American woman who appears younger than stated age  72:   10/30/20 1315  BP: 110/70  Pulse: 83  Resp: 18  Temp: (!) 97.5 F (36.4 C)  SpO2: 100%      Body mass index is 53.7 kg/m.     ECOG FS:1 - Symptomatic but completely ambulatory   Sclerae unicteric, EOMs intact Wearing a mask No cervical or supraclavicular adenopathy Lungs no rales or rhonchi Heart regular rate and rhythm Abd soft, obese, nontender, positive bowel sounds MSK no focal spinal tenderness, no upper extremity lymphedema Neuro: nonfocal, well oriented, positive affect Breasts: The  right breast has undergone lumpectomy followed by radiation.  It is firmer and smaller than the left but there is no evidence of local recurrence.  Both axillae are benign.   LAB RESULTS:  CMP     Component Value Date/Time   NA 141 10/30/2020 1249   NA 141 08/25/2016 0829   K 4.3 10/30/2020 1249   K 3.9 08/25/2016 0829   CL 108 10/30/2020 1249   CO2 23 10/30/2020 1249   CO2 24 08/25/2016 0829   GLUCOSE 95 10/30/2020 1249   GLUCOSE 108 08/25/2016 0829   BUN 10 10/30/2020 1249   BUN 11.2 08/25/2016 0829   CREATININE 0.86 10/30/2020 1249   CREATININE 0.9 08/25/2016 0829   CALCIUM 9.1 10/30/2020 1249   CALCIUM 9.3 08/25/2016 0829   PROT 7.2 10/30/2020 1249   PROT 6.9 08/25/2016 0829   ALBUMIN 3.7 10/30/2020 1249   ALBUMIN 3.2 (L) 08/25/2016 0829   AST 22 10/30/2020 1249   AST 24 08/25/2016 0829   ALT 19 10/30/2020 1249   ALT 24 08/25/2016 0829   ALKPHOS 74 10/30/2020 1249   ALKPHOS 78 08/25/2016 0829   BILITOT <0.2 (L) 10/30/2020 1249   BILITOT <0.22 08/25/2016 0829   GFRNONAA >60 10/30/2020 1249   GFRAA >60 10/06/2018 1554    I No results found for: SPEP  Lab Results  Component Value Date   WBC 5.9 10/30/2020   NEUTROABS 2.7 10/30/2020   HGB 11.9 (L) 10/30/2020   HCT 37.5 10/30/2020   MCV 83.9 10/30/2020   PLT 241 10/30/2020      Chemistry      Component Value Date/Time   NA 141 10/30/2020 1249   NA 141 08/25/2016 0829   K 4.3 10/30/2020 1249   K 3.9 08/25/2016 0829   CL 108 10/30/2020 1249   CO2 23 10/30/2020 1249   CO2 24 08/25/2016 0829   BUN 10 10/30/2020 1249   BUN 11.2 08/25/2016 0829   CREATININE 0.86 10/30/2020 1249   CREATININE 0.9 08/25/2016 0829      Component Value Date/Time   CALCIUM 9.1 10/30/2020 1249  CALCIUM 9.3 08/25/2016 0829   ALKPHOS 74 10/30/2020 1249   ALKPHOS 78 08/25/2016 0829   AST 22 10/30/2020 1249   AST 24 08/25/2016 0829   ALT 19 10/30/2020 1249   ALT 24 08/25/2016 0829   BILITOT <0.2 (L) 10/30/2020 1249    BILITOT <0.22 08/25/2016 0829       No results found for: LABCA2  No components found for: LABCA125  No results for input(s): INR in the last 168 hours.  Urinalysis    Component Value Date/Time   COLORURINE ORANGE (A) 06/22/2014 0019    STUDIES: No results found.   ASSESSMENT: 50 y.o. BRCA negative [name] woman status post right breast upper outer quadrant biopsy 08/08/2013 for a triple positive invasive ductal carcinoma, grade 2, with an MIB-1 of 52%, and a suspicious lymph node biopsied at the same time pathologically benign.  (1) genetics testing found no deleterious mutations in ATM, BARD1, BRCA1, BRCA2, BRIP1, CDH1, CHEK2, MRE11A, MUTYH, NBN, NF1, PALB2, PTEN, RAD50, RAD51C, RAD51D, and TP53  (2) neoadjuvant chemotherapy consisted of carboplatin, docetaxel, trastuzumab and pertuzumab for 6 cycles, completed 12/21/2013  (a) pertuzumab discontinued after cycle 1 because of severe diarrhea  (b) carboplatin switched for cyclophosphamide because of hypersensitivity reaction on 11/20/13  (c) docetaxel removed from final cycle because of peripheral neuropathy symptoms.  (3) trastuzumab continued to complete one year (through August 2016)  (a) repeat echocardiogram 06/25/2014 shows a well preserved ejection fraction  (4) status post right lumpectomy and sentinel lymph node sampling 01/25/2014 showing a residual pT1b pN0 invasive ductal carcinoma, grade 2, again HER-2 amplified  (5) adjuvant radiation completed 05/03/2014  (6) tamoxifen started May 2016   PLAN: [name] is now close to 7 years out from definitive surgery for her breast cancer with no evidence of disease recurrence.  This is very favorable.  She continues on tamoxifen which she tolerates well.  The plan is to continue a total of 10 years.  She has not had menstrual periods for some time.  It may be that tamoxifen or it may be that she has gone through menopause.  We have not obtain labs to clarify that  question  At this point it makes sense for her to establish herself with a medical oncologist and primary care physician in [place].  We discussed ways she can do that.  If she is not able to find a satisfactory replacement of course we will be glad to see her but I expect that she will be permanently there and find a supportive and helpful physicians without too much difficulty.  Accordingly we are not making a return appointment for her at this time.  I did give her a copy of her record and I refilled her tamoxifen and venlafaxine for the next year  Total encounter time 25 minutes.*  Total encounter time 25 minutes.*  Kourtnei Rauber, Virgie Dad, MD  10/30/20 1:41 PM Medical Oncology and Hematology [facility] [place], [place] [address] Tel. [phone]    Fax. [phone]   I, Wilburn Mylar, am acting as scribe for Dr. Virgie Dad. Shelva Hetzer.  I, Lurline Del MD, have reviewed the above documentation for accuracy and completeness, and I agree with the above.   *Total Encounter Time as defined by the Centers for Medicare and Medicaid Services includes, in addition to the face-to-face time of a patient visit (documented in the note above) non-face-to-face time: obtaining and reviewing outside history, ordering and reviewing medications, tests or procedures,  care coordination (communications with other health care professionals or caregivers) and documentation in the medical record.

## 2021-12-19 IMAGING — MG MM DIGITAL SCREENING BILAT W/ TOMO AND CAD
8 series · 8 of 24 positions shown · non-contrast
Comparison: Previous exam(s).

CLINICAL DATA: Screening.

EXAM:
DIGITAL SCREENING BILATERAL MAMMOGRAM WITH TOMOSYNTHESIS AND CAD
TECHNIQUE: Bilateral screening digital craniocaudal and mediolateral oblique
mammograms were obtained. Bilateral screening digital breast
tomosynthesis was performed. The images were evaluated with
computer-aided detection.

[L MLO synth-2D]
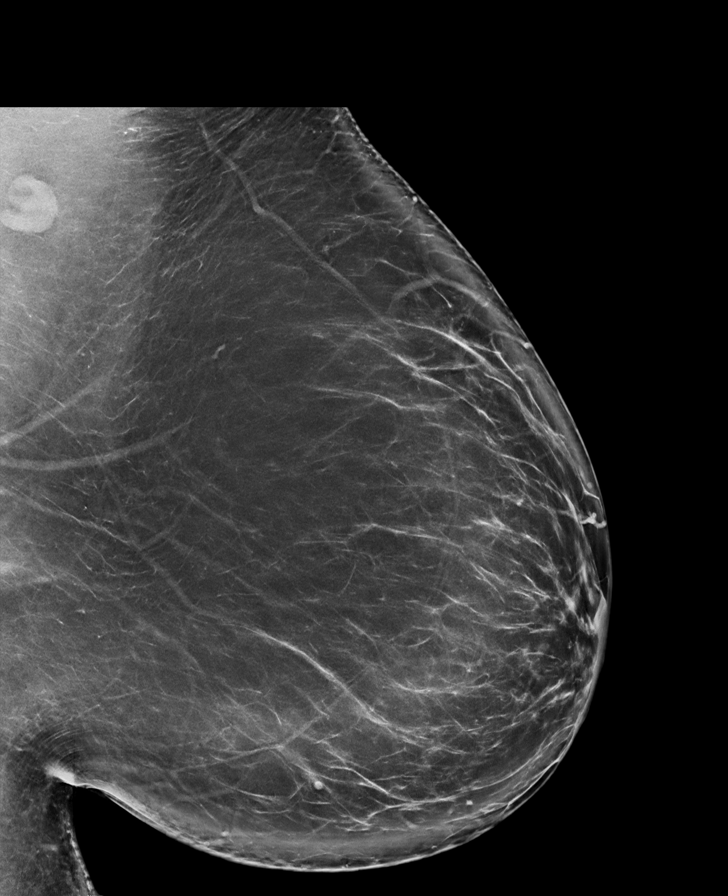

[R MLO synth-2D]
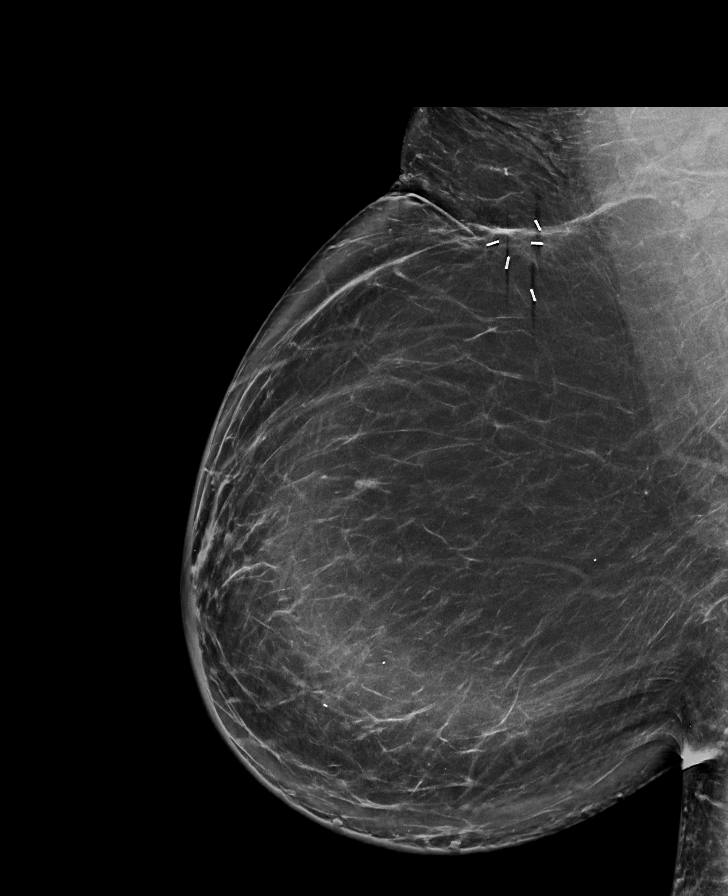

[L CC synth-2D]
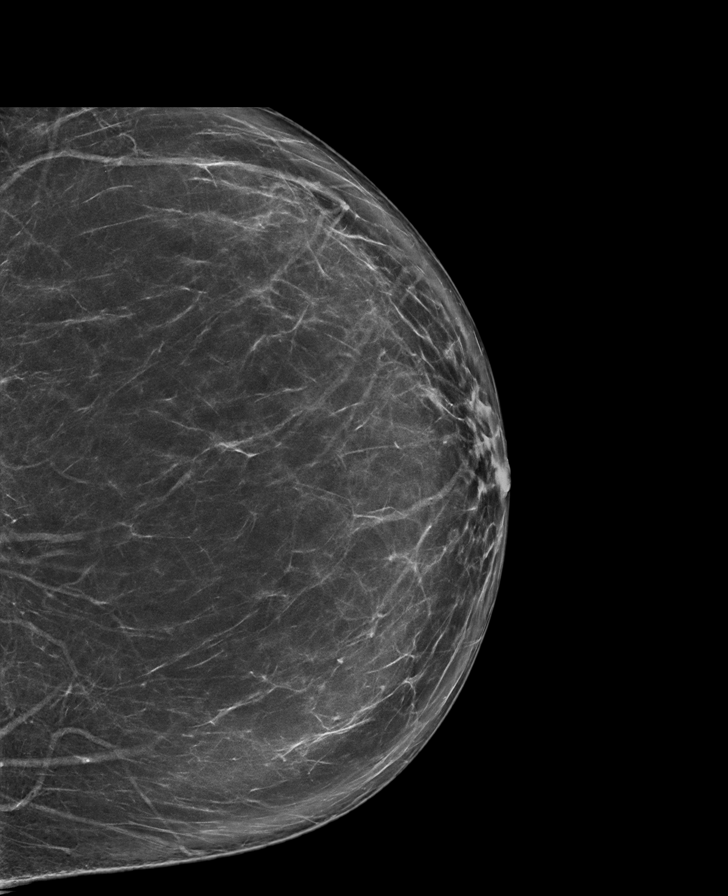

[R CC synth-2D]
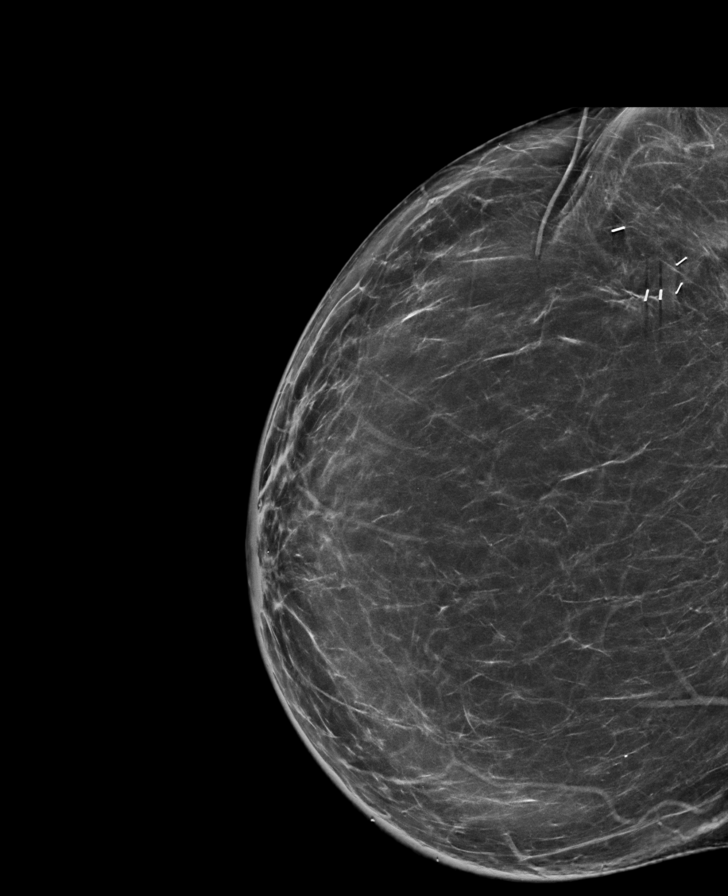

[L CC tomo · tomo slice 45/89.0]
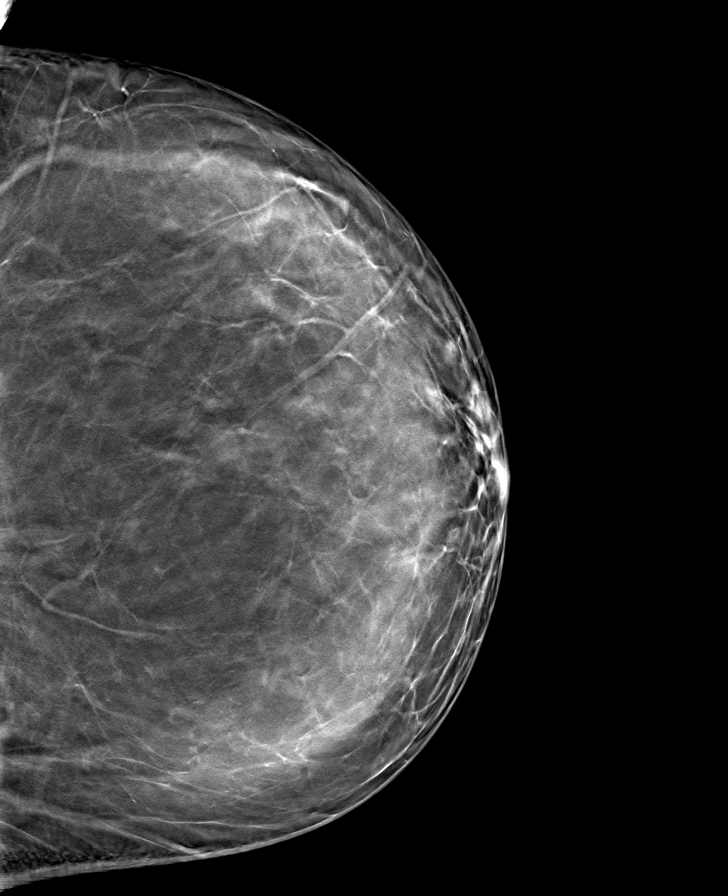

[R MLO tomo · tomo slice 51/101.0]
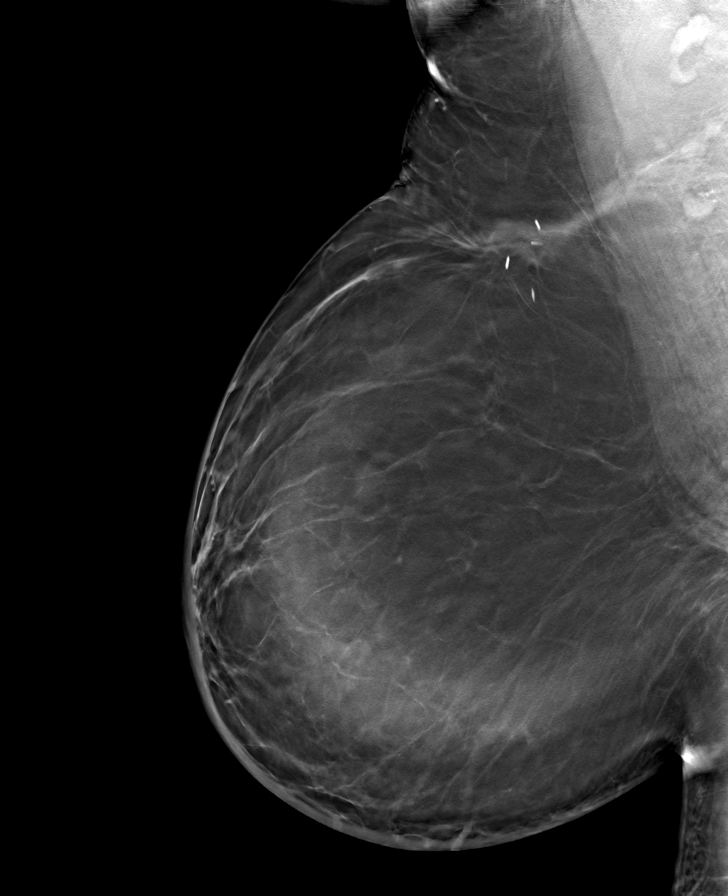

[L MLO tomo · tomo slice 55/108.0]
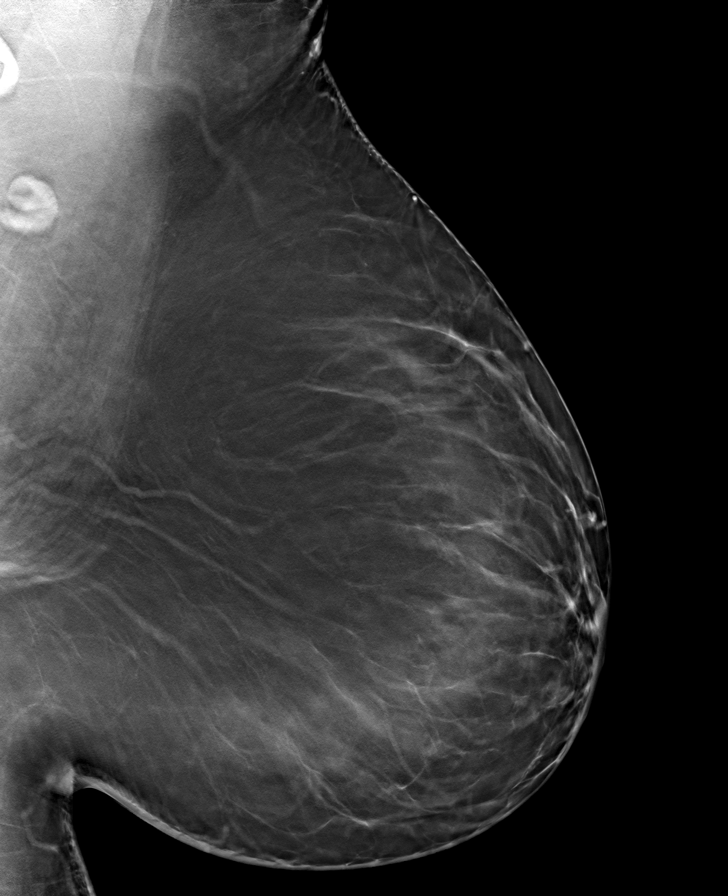

[R CC tomo · tomo slice 42/83.0]
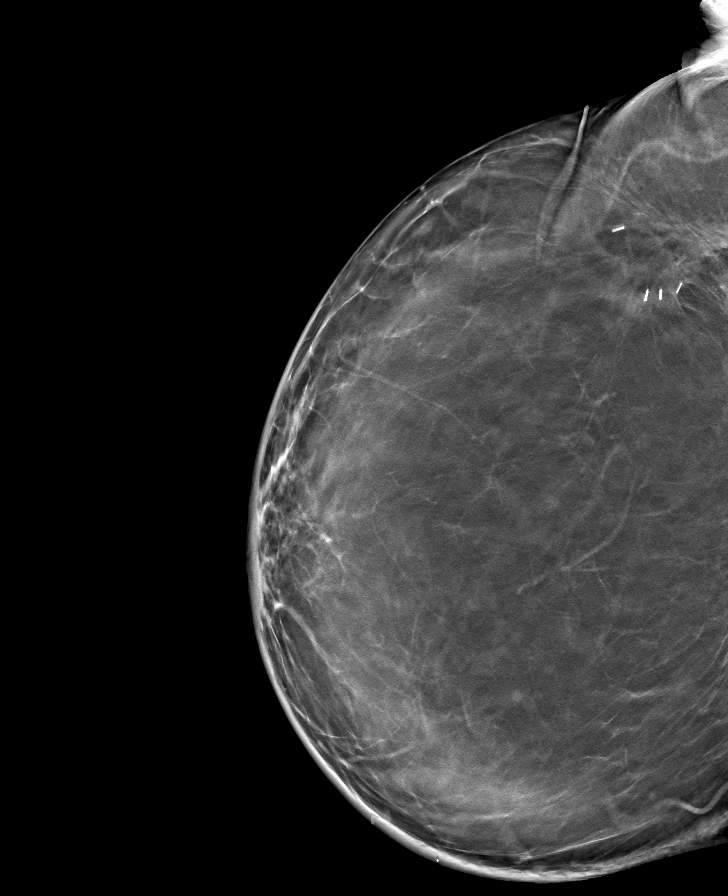

[8 of 24 positions shown; findings below may reference images not displayed]

ACR Breast Density Category b: There are scattered areas of
fibroglandular density.
FINDINGS: There are no findings suspicious for malignancy.
IMPRESSION: No mammographic evidence of malignancy. A result letter of this
screening mammogram will be mailed directly to the patient.

RECOMMENDATION:
Screening mammogram in one year. (Code:51-O-LD2)

BI-RADS CATEGORY  1: Negative.

## 2023-07-30 ENCOUNTER — Encounter: Payer: Self-pay | Admitting: Advanced Practice Midwife
